# Patient Record
Sex: Female | Born: 1965 | Race: White | Hispanic: No | Marital: Married | Smoking: Never smoker
Health system: Southern US, Community
[De-identification: ages and names within clinical notes are randomized; demographics above are authoritative.]

## PROBLEM LIST (undated history)

## (undated) ENCOUNTER — Emergency Department: Admission: EM | Discharge status: Absent without leave | Payer: BC Managed Care – PPO

## (undated) DIAGNOSIS — G43909 Migraine, unspecified, not intractable, without status migrainosus: Secondary | ICD-10-CM

## (undated) DIAGNOSIS — S40811A Abrasion of right upper arm, initial encounter: Secondary | ICD-10-CM

## (undated) DIAGNOSIS — Z9889 Other specified postprocedural states: Secondary | ICD-10-CM

## (undated) DIAGNOSIS — H3321 Serous retinal detachment, right eye: Secondary | ICD-10-CM

## (undated) DIAGNOSIS — Z853 Personal history of malignant neoplasm of breast: Secondary | ICD-10-CM

## (undated) DIAGNOSIS — E039 Hypothyroidism, unspecified: Secondary | ICD-10-CM

## (undated) DIAGNOSIS — T8859XA Other complications of anesthesia, initial encounter: Secondary | ICD-10-CM

## (undated) DIAGNOSIS — T4145XA Adverse effect of unspecified anesthetic, initial encounter: Secondary | ICD-10-CM

## (undated) DIAGNOSIS — R112 Nausea with vomiting, unspecified: Secondary | ICD-10-CM

## (undated) HISTORY — DX: Serous retinal detachment, right eye: H33.21

## (undated) HISTORY — PX: PALATE TO GINGIVA GRAFT: SHX2152

---

## 1998-06-18 ENCOUNTER — Other Ambulatory Visit: Admission: RE | Admit: 1998-06-18 | Discharge: 1998-06-18 | Payer: Self-pay | Admitting: Obstetrics and Gynecology

## 2000-04-15 ENCOUNTER — Other Ambulatory Visit: Admission: RE | Admit: 2000-04-15 | Discharge: 2000-04-15 | Payer: Self-pay | Admitting: Obstetrics and Gynecology

## 2001-05-19 ENCOUNTER — Other Ambulatory Visit: Admission: RE | Admit: 2001-05-19 | Discharge: 2001-05-19 | Payer: Self-pay | Admitting: Obstetrics and Gynecology

## 2001-10-18 ENCOUNTER — Encounter: Payer: Self-pay | Admitting: Obstetrics and Gynecology

## 2001-10-18 ENCOUNTER — Encounter: Admission: RE | Admit: 2001-10-18 | Discharge: 2001-10-18 | Payer: Self-pay | Admitting: Obstetrics and Gynecology

## 2002-06-27 ENCOUNTER — Other Ambulatory Visit: Admission: RE | Admit: 2002-06-27 | Discharge: 2002-06-27 | Payer: Self-pay | Admitting: Obstetrics and Gynecology

## 2003-07-11 ENCOUNTER — Other Ambulatory Visit: Admission: RE | Admit: 2003-07-11 | Discharge: 2003-07-11 | Payer: Self-pay | Admitting: Obstetrics and Gynecology

## 2004-08-19 ENCOUNTER — Other Ambulatory Visit: Admission: RE | Admit: 2004-08-19 | Discharge: 2004-08-19 | Payer: Self-pay | Admitting: Obstetrics and Gynecology

## 2005-09-11 ENCOUNTER — Other Ambulatory Visit: Admission: RE | Admit: 2005-09-11 | Discharge: 2005-09-11 | Payer: Self-pay | Admitting: Obstetrics and Gynecology

## 2006-11-12 ENCOUNTER — Encounter: Admission: RE | Admit: 2006-11-12 | Discharge: 2006-11-12 | Payer: Self-pay | Admitting: Obstetrics and Gynecology

## 2007-11-22 ENCOUNTER — Encounter: Admission: RE | Admit: 2007-11-22 | Discharge: 2007-11-22 | Payer: Self-pay | Admitting: Obstetrics and Gynecology

## 2008-08-01 ENCOUNTER — Encounter: Admission: RE | Admit: 2008-08-01 | Discharge: 2008-08-01 | Payer: Self-pay | Admitting: Obstetrics and Gynecology

## 2010-01-03 ENCOUNTER — Encounter: Admission: RE | Admit: 2010-01-03 | Discharge: 2010-01-03 | Payer: Self-pay | Admitting: Obstetrics and Gynecology

## 2012-07-28 DIAGNOSIS — Z853 Personal history of malignant neoplasm of breast: Secondary | ICD-10-CM

## 2012-07-28 HISTORY — DX: Personal history of malignant neoplasm of breast: Z85.3

## 2013-03-02 ENCOUNTER — Other Ambulatory Visit: Payer: Self-pay | Admitting: Obstetrics and Gynecology

## 2013-03-02 DIAGNOSIS — Z1231 Encounter for screening mammogram for malignant neoplasm of breast: Secondary | ICD-10-CM

## 2013-04-18 ENCOUNTER — Ambulatory Visit
Admission: RE | Admit: 2013-04-18 | Discharge: 2013-04-18 | Disposition: A | Discharge status: Home / Self Care | Payer: BC Managed Care – PPO | Source: Ambulatory Visit | Attending: Obstetrics and Gynecology | Admitting: Obstetrics and Gynecology

## 2013-04-18 DIAGNOSIS — Z1231 Encounter for screening mammogram for malignant neoplasm of breast: Secondary | ICD-10-CM

## 2013-04-21 ENCOUNTER — Other Ambulatory Visit: Payer: Self-pay | Admitting: Obstetrics and Gynecology

## 2013-04-21 DIAGNOSIS — Z803 Family history of malignant neoplasm of breast: Secondary | ICD-10-CM

## 2013-04-29 ENCOUNTER — Ambulatory Visit
Admission: RE | Admit: 2013-04-29 | Discharge: 2013-04-29 | Disposition: A | Discharge status: Home / Self Care | Payer: BC Managed Care – PPO | Source: Ambulatory Visit | Attending: Obstetrics and Gynecology | Admitting: Obstetrics and Gynecology

## 2013-04-29 DIAGNOSIS — Z803 Family history of malignant neoplasm of breast: Secondary | ICD-10-CM

## 2013-04-29 MED ORDER — GADOBENATE DIMEGLUMINE 529 MG/ML IV SOLN
20.0000 mL | Freq: Once | INTRAVENOUS | Status: AC | PRN
  Administered 2013-04-29: 20 mL via INTRAVENOUS

## 2013-05-03 ENCOUNTER — Other Ambulatory Visit: Payer: Self-pay | Admitting: Obstetrics and Gynecology

## 2013-05-03 DIAGNOSIS — R928 Other abnormal and inconclusive findings on diagnostic imaging of breast: Secondary | ICD-10-CM

## 2013-05-12 ENCOUNTER — Other Ambulatory Visit: Payer: Self-pay | Admitting: Diagnostic Radiology

## 2013-05-12 ENCOUNTER — Ambulatory Visit
Admission: RE | Admit: 2013-05-12 | Discharge: 2013-05-12 | Disposition: A | Discharge status: Home / Self Care | Payer: BC Managed Care – PPO | Source: Ambulatory Visit | Attending: Obstetrics and Gynecology | Admitting: Obstetrics and Gynecology

## 2013-05-12 DIAGNOSIS — R928 Other abnormal and inconclusive findings on diagnostic imaging of breast: Secondary | ICD-10-CM

## 2013-05-12 MED ORDER — GADOBENATE DIMEGLUMINE 529 MG/ML IV SOLN
20.0000 mL | Freq: Once | INTRAVENOUS | Status: AC | PRN
  Administered 2013-05-12: 20 mL via INTRAVENOUS

## 2013-05-19 ENCOUNTER — Encounter (INDEPENDENT_AMBULATORY_CARE_PROVIDER_SITE_OTHER): Payer: Self-pay

## 2013-05-19 ENCOUNTER — Encounter (INDEPENDENT_AMBULATORY_CARE_PROVIDER_SITE_OTHER): Payer: Self-pay | Admitting: General Surgery

## 2013-05-19 ENCOUNTER — Ambulatory Visit (INDEPENDENT_AMBULATORY_CARE_PROVIDER_SITE_OTHER): Payer: BC Managed Care – PPO | Admitting: General Surgery

## 2013-05-19 VITALS — BP 118/70 | HR 76 | Temp 98.9°F | Resp 14 | Ht 67.5 in | Wt 225.0 lb

## 2013-05-19 DIAGNOSIS — D0502 Lobular carcinoma in situ of left breast: Secondary | ICD-10-CM

## 2013-05-19 DIAGNOSIS — C50919 Malignant neoplasm of unspecified site of unspecified female breast: Secondary | ICD-10-CM

## 2013-05-19 NOTE — Patient Instructions (Signed)
Plan for genetics testing and high risk visit with Dr. Welton Flakes

## 2013-05-19 NOTE — Progress Notes (Signed)
Patient ID: [name], female   DOB: March 23, 1966, 47 y.o.   MRN: [id number]  Chief Complaint  Patient presents with  . 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 female.  We are asked to see the patient in consultation by Dr. Arelia Sneddon to evaluate her for lobular neoplasia of the left breast. The patient is a 15 her white female who recently underwent an MRI study of both breasts because of her strong family history. At that time she was found to have 2 abnormalities in the 12:00 position of the left breast. Both of these were biopsied. 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 She has a long history of occasional pain in both breasts. She denies any discharge from the nipple. She does have a sister who is 60 years old and was recently diagnosed with stage I breast cancer. Her mother was also diagnosed with breast cancer at the age of 42. She does not take any female hormones HPI  Past Medical History  Diagnosis Date  . Anemia   . Cancer     LCIS  . Hyperlipidemia   . Thyroid disease     History reviewed. No pertinent past surgical history.  Family History  Problem Relation Age of Onset  . Cancer Mother     breast  . Cancer Father     melanoma  . Cancer Sister     sister    Social History History  Substance Use Topics  . Smoking status: Never Smoker   . Smokeless tobacco: Never Used  . Alcohol Use: No    Allergies  Allergen Reactions  . Penicillins Rash    Current Outpatient Prescriptions  Medication Sig Dispense Refill  . Calcium Carbonate-Vitamin D (CALCIUM + D PO) Take by mouth.      . cholestyramine (QUESTRAN) 4 GM/DOSE powder Take by mouth 3 (three) times daily with meals.      Marland Kitchen levothyroxine (SYNTHROID, LEVOTHROID) 112 MCG tablet Take 112 mcg by mouth daily before breakfast.      . Multiple Vitamin (MULTIVITAMIN) tablet Take 1 tablet by mouth daily.       No  current facility-administered medications for this visit.    Review of Systems Review of Systems  Constitutional: Negative.   HENT: Negative.   Eyes: Negative.   Respiratory: Negative.   Cardiovascular: Negative.   Gastrointestinal: Positive for abdominal pain.  Endocrine: Negative.   Genitourinary: Negative.   Musculoskeletal: Positive for arthralgias.  Skin: Negative.   Allergic/Immunologic: Negative.   Neurological: Negative.   Hematological: Negative.   Psychiatric/Behavioral: Negative.     Blood pressure 118/70, pulse 76, temperature 98.9 F (37.2 C), temperature source Temporal, resp. rate 14, height 5' 7.5" (1.715 m), weight 225 lb (102.059 kg).  Physical Exam Physical Exam  Constitutional: She is oriented to person, place, and time. She appears well-developed and well-nourished.  HENT:  Head: Normocephalic and atraumatic.  Eyes: Conjunctivae and EOM are normal. Pupils are equal, round, and reactive to light.  Neck: Normal range of motion. Neck supple.  Cardiovascular: Normal rate, regular rhythm and normal heart sounds.   Pulmonary/Chest: Effort normal and breath sounds normal.  There is some palpable bruising in the 12:00 position of the left breast. Other than this there is no other palpable mass in either breast. There is no palpable axillary, supraclavicular, or cervical lymphadenopathy  Abdominal: Soft. Bowel sounds are normal. She exhibits no mass. There is no tenderness.  Musculoskeletal: Normal range of motion.  Lymphadenopathy:    She has no cervical adenopathy.  Neurological: She is alert and oriented to person, place, and time.  Skin: Skin is warm and dry.  Psychiatric: She has a normal mood and affect. Her behavior is normal.    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 I  would also like her to meet with Dr. Welton Flakes to evaluate her Because she is in a high risk category for breast cancer. At a minimum I think she needs to have both of these high risk areas removed with a lumpectomy. The recommendation might change based on her genetic testing. I will plan to see her back in about 3 or 4 weeks once her evaluation is complete       TOTH III,Molli Gethers S 05/19/2013, 3:44 PM

## 2013-05-23 ENCOUNTER — Telehealth: Payer: Self-pay | Admitting: Genetic Counselor

## 2013-05-23 NOTE — Telephone Encounter (Signed)
Called pt to schedule genetic appt per pt MD wanted pt seen before next visit with him on 11/20. Emailed Clydie Braun to make aware.

## 2013-05-24 ENCOUNTER — Telehealth: Payer: Self-pay | Admitting: Genetic Counselor

## 2013-05-24 ENCOUNTER — Telehealth (INDEPENDENT_AMBULATORY_CARE_PROVIDER_SITE_OTHER): Payer: Self-pay | Admitting: General Surgery

## 2013-05-24 NOTE — Telephone Encounter (Signed)
[facility] APPT 10/29 @ 9 W/KAREN POWELL REFERRING DR. Carolynne Edouard

## 2013-05-24 NOTE — Telephone Encounter (Signed)
Patient wanted Dr Carolynne Edouard to know that the [facility] can not get here in the cancer ctr to see genetic testing or see a Dr until Jan and the patient called [facility] and they can get her in 06-01-13. And she can get the genetic testing and see a Doctor at the same time. But the patient wants Dr Carolynne Edouard to be her Doctor. She wants Dr Carolynne Edouard to call her back on [phone]

## 2013-05-25 ENCOUNTER — Telehealth (INDEPENDENT_AMBULATORY_CARE_PROVIDER_SITE_OTHER): Payer: Self-pay | Admitting: General Surgery

## 2013-05-25 ENCOUNTER — Ambulatory Visit (HOSPITAL_BASED_OUTPATIENT_CLINIC_OR_DEPARTMENT_OTHER): Payer: BC Managed Care – PPO | Admitting: Genetic Counselor

## 2013-05-25 ENCOUNTER — Encounter: Payer: Self-pay | Admitting: Genetic Counselor

## 2013-05-25 ENCOUNTER — Other Ambulatory Visit: Payer: BC Managed Care – PPO | Admitting: Lab

## 2013-05-25 DIAGNOSIS — Z803 Family history of malignant neoplasm of breast: Secondary | ICD-10-CM

## 2013-05-25 DIAGNOSIS — D0502 Lobular carcinoma in situ of left breast: Secondary | ICD-10-CM

## 2013-05-25 DIAGNOSIS — D0590 Unspecified type of carcinoma in situ of unspecified breast: Secondary | ICD-10-CM

## 2013-05-25 DIAGNOSIS — IMO0002 Reserved for concepts with insufficient information to code with codable children: Secondary | ICD-10-CM

## 2013-05-25 NOTE — Telephone Encounter (Signed)
[name] called today to let Dr Carolynne Edouard know that she had her genetic testing was done and is waiting on to see Dr Welton Flakes. Patient stated she will talk to Dr Carolynne Edouard on her next Doctor apt to see if he can her get her in sooner to see Dr Welton Flakes. Dr Welton Flakes wants to wait up to 6 -8 weeks before seeing her

## 2013-05-25 NOTE — Telephone Encounter (Signed)
Pt was called and give appt for today.

## 2013-05-25 NOTE — Progress Notes (Signed)
Dr.  Chevis Pretty requested a consultation for genetic counseling and risk assessment for [name], a 47 y.o. female, for discussion of her prersonal history of LCIS and family history of breast cancer.  She presents to clinic today to discuss the possibility of a genetic predisposition to cancer, and to further clarify her risks, as well as her family members' risks for cancer.   HISTORY OF PRESENT ILLNESS: In 2014, at the age of 62, [name] was diagnosed with LCIS of the left breast. This will be treated with lumpectomy and tamoxifen.  Her tumor prognostics are not known at this time.  She has not had a colonoscopy. Her sister has been tested for BRCA mutations in July 2014 and was negative.   Past Medical History  Diagnosis Date  . Anemia   . Cancer     LCIS  . Hyperlipidemia   . Thyroid disease     History reviewed. No pertinent past surgical history.  History   Social History  . Marital Status: Married    Spouse Name: [name]    Number of Children: 0  . Years of Education: N/A   Occupational History  .     Social History Main Topics  . Smoking status: Never Smoker   . Smokeless tobacco: Never Used  . Alcohol Use: No  . Drug Use: No  . Sexual Activity: Yes   Other Topics Concern  . None   Social History Narrative  . None    REPRODUCTIVE HISTORY AND PERSONAL RISK ASSESSMENT FACTORS: Menarche was at age 17.   premenopausal Uterus Intact: yes Ovaries Intact: yes [id number], first live birth at age N/A  She has not previously undergone treatment for infertility.   Oral Contraceptive use: 20 years   She has not used HRT in the past.    FAMILY HISTORY:  We obtained a detailed, 4-generation family history.  Significant diagnoses are listed below: Family History  Problem Relation Age of Onset  . Breast cancer Mother 34  . Melanoma Father 4  . Breast cancer Sister 81    ER+/her2-; onco score = 8  . Cancer Maternal Aunt     oral cancer; smoker  . Head &  neck cancer Paternal Uncle     smoker  . Breast cancer Cousin     dx in her late 64s-60s  Her mother was diagnosed in 62 at at the age of 6 with invasive breast cancer.  She had a modified radical mastectomy and took tamoxifen for 10 years.  Her sister was diagnosed with breast cancer at 54.  She was ER+/Her2- and her oncoscore was 8.  Patient's maternal ancestors are of [place] descent, and paternal ancestors are of [place], [place] and [place] descent. There is no reported Ashkenazi Jewish ancestry. There is no known consanguinity.  GENETIC COUNSELING ASSESSMENT: [name] is a 47 y.o. female with a personal history of LCIS breast cancer and family history of breast cancer which somewhat suggestive of a hereditary cancer syndrome and predisposition to cancer. We, therefore, discussed and recommended the following at today's visit.   DISCUSSION: We reviewed the characteristics, features and inheritance patterns of hereditary cancer syndromes. We also discussed genetic testing, including the appropriate family members to test, the process of testing, insurance coverage and turn-around-time for results. We discussed that since her sister is negative for BRCA mutations, it lowers her risk somewhat for testing positive for a BRCA mutation.  We reviewed the candidate genes associated  with hereditary breast cancer syndromes and their varying risks.  Her sister did not have a cancer panel performed in July, so if the patient tests positive for a different gene, then her sister could consider further testing for that mutation.    PLAN: After considering the risks, benefits, and limitations, [name] provided informed consent to pursue genetic testing and the blood sample will be sent to [facility] for analysis of the Breast/Ovarian Cancer Panel. We discussed the implications of a positive, negative and/ or variant of uncertain significance genetic test result. Results should be  available within approximately 3 weeks' time, at which point they will be disclosed by telephone to [name], as will any additional recommendations warranted by these results. [name] will receive a summary of her genetic counseling visit and a copy of her results once available. This information will also be available in Epic. We encouraged [name] to remain in contact with cancer genetics annually so that we can continuously update the family history and inform her of any changes in cancer genetics and testing that may be of benefit for her family. [name]'s questions were answered to her satisfaction today. Our contact information was provided should additional questions or concerns arise.  The patient was seen for a total of 60 minutes, greater than 50% of which was spent face-to-face counseling.  This note will also be sent to the referring provider via the electronic medical record. The patient will be supplied with a summary of this genetic counseling discussion as well as educational information on the discussed hereditary cancer syndromes following the conclusion of their visit.   Patient was discussed with Dr. Drue Second.   _______________________________________________________________________ For Office Staff:  Number of people involved in session: 2 Was an Intern/ student involved with case: no

## 2013-06-08 ENCOUNTER — Telehealth: Payer: Self-pay | Admitting: Genetic Counselor

## 2013-06-08 NOTE — Telephone Encounter (Signed)
Revealed negative genetic test results but that she has an STK11 VUS

## 2013-06-10 ENCOUNTER — Encounter: Payer: Self-pay | Admitting: Genetic Counselor

## 2013-06-13 ENCOUNTER — Telehealth (INDEPENDENT_AMBULATORY_CARE_PROVIDER_SITE_OTHER): Payer: Self-pay

## 2013-06-13 NOTE — Telephone Encounter (Signed)
Pt called in to let our office know that she still has not heard from Dr. Milta Deiters office about her appointment with them.  Asked if she still needs to come see Dr. Carolynne Edouard on 11/20.  I suggested that she keep her appt with Dr. Carolynne Edouard, and we will attempt to get her in with Dr. Welton Flakes.

## 2013-06-14 NOTE — Telephone Encounter (Signed)
Called [name] and moved her appt time up with Dr Carolynne Edouard on Thursday. Advised I sent another request for appt with Dr Welton Flakes to cancer center. Will await appt.

## 2013-06-16 ENCOUNTER — Encounter (INDEPENDENT_AMBULATORY_CARE_PROVIDER_SITE_OTHER): Payer: Self-pay | Admitting: General Surgery

## 2013-06-16 ENCOUNTER — Ambulatory Visit (INDEPENDENT_AMBULATORY_CARE_PROVIDER_SITE_OTHER): Payer: BC Managed Care – PPO | Admitting: General Surgery

## 2013-06-16 ENCOUNTER — Encounter (HOSPITAL_BASED_OUTPATIENT_CLINIC_OR_DEPARTMENT_OTHER): Payer: Self-pay | Admitting: *Deleted

## 2013-06-16 VITALS — BP 122/78 | HR 84 | Temp 98.3°F | Resp 18 | Ht 67.5 in | Wt 228.0 lb

## 2013-06-16 DIAGNOSIS — D0502 Lobular carcinoma in situ of left breast: Secondary | ICD-10-CM

## 2013-06-16 DIAGNOSIS — C50919 Malignant neoplasm of unspecified site of unspecified female breast: Secondary | ICD-10-CM

## 2013-06-16 NOTE — Progress Notes (Signed)
Subjective:     Patient ID: [name], female   DOB: [birth year], 47 y.o.   MRN: [id number]  HPI The patient is a 47 year old white female who has 2 high risk areas in the 12:00 position of the left breast. 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 Since her last visit she underwent genetic testing and it is negative for BRCA1 or 2. She does have a variant of unknown significance. She denies any breast pain.  Review of Systems  Constitutional: Negative.   HENT: Negative.   Eyes: Negative.   Respiratory: Negative.   Cardiovascular: Negative.   Gastrointestinal: Negative.   Endocrine: Negative.   Genitourinary: Negative.   Musculoskeletal: Negative.   Skin: Negative.   Allergic/Immunologic: Negative.   Neurological: Negative.   Hematological: Negative.   Psychiatric/Behavioral: Negative.        Objective:   Physical Exam  Constitutional: She is oriented to person, place, and time. She appears well-developed and well-nourished.  HENT:  Head: Normocephalic and atraumatic.  Eyes: Conjunctivae and EOM are normal. Pupils are equal, round, and reactive to light.  Neck: Normal range of motion. Neck supple.  Cardiovascular: Normal rate, regular rhythm and normal heart sounds.   Pulmonary/Chest: Effort normal and breath sounds normal.  There is no palpable mass in either breast. There is no palpable axillary, supraclavicular, or cervical lymphadenopathy  Abdominal: Soft. Bowel sounds are normal. She exhibits no mass. There is no tenderness.  Musculoskeletal: Normal range of motion.  Lymphadenopathy:    She has no cervical adenopathy.  Neurological: She is alert and oriented to person, place, and time.  Skin: Skin is warm and dry.  Psychiatric: She has a normal mood and affect. Her behavior is normal.       Assessment:     The patient is a 47 year old white female was too high risk lesions in the 12:00 position of the left breast.     Plan:     At this point I would recommend wire  localized lumpectomy of these 2 areas. I've discussed with her in detail the risks and benefits of the operation and dizziness as well as some of the technical aspects and she understands and wishes to proceed

## 2013-06-16 NOTE — Patient Instructions (Signed)
Plan for left breast wire localized lumpectomy 

## 2013-06-20 ENCOUNTER — Encounter (HOSPITAL_BASED_OUTPATIENT_CLINIC_OR_DEPARTMENT_OTHER)
Admission: RE | Disposition: A | Discharge status: Home / Self Care | Payer: Self-pay | Source: Ambulatory Visit | Attending: General Surgery

## 2013-06-20 ENCOUNTER — Ambulatory Visit (HOSPITAL_BASED_OUTPATIENT_CLINIC_OR_DEPARTMENT_OTHER)
Admission: RE | Admit: 2013-06-20 | Discharge: 2013-06-20 | Disposition: A | Discharge status: Home / Self Care | Payer: BC Managed Care – PPO | Source: Ambulatory Visit | Attending: General Surgery | Admitting: General Surgery

## 2013-06-20 ENCOUNTER — Ambulatory Visit (HOSPITAL_BASED_OUTPATIENT_CLINIC_OR_DEPARTMENT_OTHER): Payer: BC Managed Care – PPO | Admitting: Anesthesiology

## 2013-06-20 ENCOUNTER — Encounter (HOSPITAL_BASED_OUTPATIENT_CLINIC_OR_DEPARTMENT_OTHER): Payer: Self-pay | Admitting: *Deleted

## 2013-06-20 ENCOUNTER — Encounter (HOSPITAL_BASED_OUTPATIENT_CLINIC_OR_DEPARTMENT_OTHER): Payer: BC Managed Care – PPO | Admitting: Anesthesiology

## 2013-06-20 ENCOUNTER — Ambulatory Visit
Admission: RE | Admit: 2013-06-20 | Discharge: 2013-06-20 | Disposition: A | Discharge status: Home / Self Care | Payer: BC Managed Care – PPO | Source: Ambulatory Visit | Attending: General Surgery | Admitting: General Surgery

## 2013-06-20 DIAGNOSIS — N6019 Diffuse cystic mastopathy of unspecified breast: Secondary | ICD-10-CM | POA: Insufficient documentation

## 2013-06-20 DIAGNOSIS — D0502 Lobular carcinoma in situ of left breast: Secondary | ICD-10-CM

## 2013-06-20 DIAGNOSIS — D249 Benign neoplasm of unspecified breast: Secondary | ICD-10-CM

## 2013-06-20 DIAGNOSIS — E039 Hypothyroidism, unspecified: Secondary | ICD-10-CM | POA: Insufficient documentation

## 2013-06-20 DIAGNOSIS — N6089 Other benign mammary dysplasias of unspecified breast: Secondary | ICD-10-CM | POA: Insufficient documentation

## 2013-06-20 DIAGNOSIS — D049 Carcinoma in situ of skin, unspecified: Secondary | ICD-10-CM | POA: Insufficient documentation

## 2013-06-20 DIAGNOSIS — D4860 Neoplasm of uncertain behavior of unspecified breast: Secondary | ICD-10-CM

## 2013-06-20 HISTORY — DX: Hypothyroidism, unspecified: E03.9

## 2013-06-20 HISTORY — DX: Other complications of anesthesia, initial encounter: T88.59XA

## 2013-06-20 HISTORY — PX: BREAST LUMPECTOMY WITH NEEDLE LOCALIZATION: SHX5759

## 2013-06-20 HISTORY — DX: Adverse effect of unspecified anesthetic, initial encounter: T41.45XA

## 2013-06-20 SURGERY — BREAST LUMPECTOMY WITH NEEDLE LOCALIZATION
Anesthesia: General | Site: Breast | Laterality: Left | Wound class: Clean

## 2013-06-20 MED ORDER — ONDANSETRON HCL 4 MG/2ML IJ SOLN
INTRAMUSCULAR | Status: DC | PRN
  Administered 2013-06-20: 4 mg via INTRAVENOUS

## 2013-06-20 MED ORDER — PROMETHAZINE HCL 25 MG/ML IJ SOLN
6.2500 mg | INTRAMUSCULAR | Status: DC | PRN
  Administered 2013-06-20: 6.25 mg via INTRAVENOUS

## 2013-06-20 MED ORDER — FENTANYL CITRATE 0.05 MG/ML IJ SOLN
INTRAMUSCULAR | Status: DC | PRN
  Administered 2013-06-20 (×2): 25 ug via INTRAVENOUS
  Administered 2013-06-20: 50 ug via INTRAVENOUS
  Administered 2013-06-20: 100 ug via INTRAVENOUS

## 2013-06-20 MED ORDER — BUPIVACAINE HCL (PF) 0.25 % IJ SOLN
INTRAMUSCULAR | Status: DC | PRN
  Administered 2013-06-20: 16 mL

## 2013-06-20 MED ORDER — VANCOMYCIN HCL IN DEXTROSE 1-5 GM/200ML-% IV SOLN
1000.0000 mg | INTRAVENOUS | Status: AC
  Administered 2013-06-20 (×2): 1000 mg via INTRAVENOUS

## 2013-06-20 MED ORDER — OXYCODONE HCL 5 MG/5ML PO SOLN
5.0000 mg | Freq: Once | ORAL | Status: DC | PRN
Start: 2013-06-20 — End: 2013-06-20

## 2013-06-20 MED ORDER — LIDOCAINE-EPINEPHRINE 0.5 %-1:200000 IJ SOLN
INTRAMUSCULAR | Status: AC
  Filled 2013-06-20: qty 1

## 2013-06-20 MED ORDER — PROMETHAZINE HCL 25 MG/ML IJ SOLN
INTRAMUSCULAR | Status: AC
  Filled 2013-06-20: qty 7

## 2013-06-20 MED ORDER — CHLORHEXIDINE GLUCONATE 4 % EX LIQD: 1.0000 "application " | Freq: Once | CUTANEOUS | Status: DC

## 2013-06-20 MED ORDER — LIDOCAINE HCL (CARDIAC) 20 MG/ML IV SOLN
INTRAVENOUS | Status: DC | PRN
  Administered 2013-06-20: 100 mg via INTRAVENOUS

## 2013-06-20 MED ORDER — FENTANYL CITRATE 0.05 MG/ML IJ SOLN: 50.0000 ug | INTRAMUSCULAR | Status: DC | PRN

## 2013-06-20 MED ORDER — DEXAMETHASONE SODIUM PHOSPHATE 4 MG/ML IJ SOLN
INTRAMUSCULAR | Status: DC | PRN
  Administered 2013-06-20: 10 mg via INTRAVENOUS

## 2013-06-20 MED ORDER — OXYCODONE-ACETAMINOPHEN 5-325 MG PO TABS: 1.0000 | ORAL_TABLET | ORAL | Status: DC | PRN

## 2013-06-20 MED ORDER — MIDAZOLAM HCL 5 MG/5ML IJ SOLN
INTRAMUSCULAR | Status: DC | PRN
  Administered 2013-06-20: 2 mg via INTRAVENOUS

## 2013-06-20 MED ORDER — BUPIVACAINE HCL (PF) 0.25 % IJ SOLN
INTRAMUSCULAR | Status: AC
  Filled 2013-06-20: qty 30

## 2013-06-20 MED ORDER — PROPOFOL 10 MG/ML IV BOLUS
INTRAVENOUS | Status: DC | PRN
  Administered 2013-06-20: 200 mg via INTRAVENOUS
  Administered 2013-06-20: 50 mg via INTRAVENOUS

## 2013-06-20 MED ORDER — OXYCODONE HCL 5 MG PO TABS: 5.0000 mg | ORAL_TABLET | Freq: Once | ORAL | Status: DC | PRN

## 2013-06-20 MED ORDER — PROPOFOL 10 MG/ML IV BOLUS
INTRAVENOUS | Status: AC
  Filled 2013-06-20: qty 20

## 2013-06-20 MED ORDER — MIDAZOLAM HCL 2 MG/2ML IJ SOLN: 1.0000 mg | INTRAMUSCULAR | Status: DC | PRN

## 2013-06-20 MED ORDER — MIDAZOLAM HCL 2 MG/2ML IJ SOLN
INTRAMUSCULAR | Status: AC
  Filled 2013-06-20: qty 2

## 2013-06-20 MED ORDER — MIDAZOLAM HCL 2 MG/2ML IJ SOLN
1.0000 mg | Freq: Once | INTRAMUSCULAR | Status: AC | PRN
  Administered 2013-06-20: 1 mg via INTRAVENOUS

## 2013-06-20 MED ORDER — HYDROMORPHONE HCL PF 1 MG/ML IJ SOLN
0.2500 mg | INTRAMUSCULAR | Status: DC | PRN
  Administered 2013-06-20 (×2): 0.5 mg via INTRAVENOUS

## 2013-06-20 MED ORDER — LACTATED RINGERS IV SOLN
INTRAVENOUS | Status: DC
  Administered 2013-06-20 (×2): via INTRAVENOUS

## 2013-06-20 MED ORDER — HYDROMORPHONE HCL PF 1 MG/ML IJ SOLN
INTRAMUSCULAR | Status: AC
  Filled 2013-06-20: qty 1

## 2013-06-20 MED ORDER — VANCOMYCIN HCL IN DEXTROSE 1-5 GM/200ML-% IV SOLN
INTRAVENOUS | Status: AC
  Filled 2013-06-20: qty 200

## 2013-06-20 MED ORDER — FENTANYL CITRATE 0.05 MG/ML IJ SOLN
INTRAMUSCULAR | Status: AC
  Filled 2013-06-20: qty 6

## 2013-06-20 MED ORDER — MIDAZOLAM HCL 2 MG/ML PO SYRP: 12.0000 mg | ORAL_SOLUTION | Freq: Once | ORAL | Status: DC | PRN

## 2013-06-20 SURGICAL SUPPLY — 44 items
ADH SKN CLS APL DERMABOND .7 (GAUZE/BANDAGES/DRESSINGS) ×1
BLADE SURG 10 STRL SS (BLADE) ×2 IMPLANT
BLADE SURG 15 STRL LF DISP TIS (BLADE) ×1 IMPLANT
BLADE SURG 15 STRL SS (BLADE) ×2
CANISTER SUCT 1200ML W/VALVE (MISCELLANEOUS) ×2 IMPLANT
CHLORAPREP W/TINT 26ML (MISCELLANEOUS) ×2 IMPLANT
CLIP TI WIDE RED SMALL 6 (CLIP) IMPLANT
COVER MAYO STAND STRL (DRAPES) ×2 IMPLANT
COVER TABLE BACK 60X90 (DRAPES) ×2 IMPLANT
DECANTER SPIKE VIAL GLASS SM (MISCELLANEOUS) ×2 IMPLANT
DERMABOND ADVANCED (GAUZE/BANDAGES/DRESSINGS) ×1
DERMABOND ADVANCED .7 DNX12 (GAUZE/BANDAGES/DRESSINGS) ×1 IMPLANT
DEVICE DUBIN W/COMP PLATE 8390 (MISCELLANEOUS) IMPLANT
DRAPE LAPAROSCOPIC ABDOMINAL (DRAPES) ×2 IMPLANT
DRAPE UTILITY XL STRL (DRAPES) ×2 IMPLANT
ELECT BLADE 4.0 EZ CLEAN MEGAD (MISCELLANEOUS) ×2
ELECT COATED BLADE 2.86 ST (ELECTRODE) ×2 IMPLANT
ELECT REM PT RETURN 9FT ADLT (ELECTROSURGICAL) ×2
ELECTRODE BLDE 4.0 EZ CLN MEGD (MISCELLANEOUS) IMPLANT
ELECTRODE REM PT RTRN 9FT ADLT (ELECTROSURGICAL) ×1 IMPLANT
GLOVE BIO SURGEON STRL SZ7 (GLOVE) ×2 IMPLANT
GLOVE BIO SURGEON STRL SZ7.5 (GLOVE) ×2 IMPLANT
GLOVE ECLIPSE 6.5 STRL STRAW (GLOVE) ×1 IMPLANT
GOWN PREVENTION PLUS XLARGE (GOWN DISPOSABLE) ×4 IMPLANT
GOWN STRL REIN 2XL XLG LVL4 (GOWN DISPOSABLE) ×1 IMPLANT
KIT MARKER MARGIN INK (KITS) IMPLANT
NDL HYPO 25X1 1.5 SAFETY (NEEDLE) ×1 IMPLANT
NEEDLE HYPO 25X1 1.5 SAFETY (NEEDLE) ×2 IMPLANT
NS IRRIG 1000ML POUR BTL (IV SOLUTION) ×2 IMPLANT
PACK BASIN DAY SURGERY FS (CUSTOM PROCEDURE TRAY) ×2 IMPLANT
PENCIL BUTTON HOLSTER BLD 10FT (ELECTRODE) ×2 IMPLANT
SLEEVE SCD COMPRESS KNEE MED (MISCELLANEOUS) ×2 IMPLANT
SPONGE LAP 18X18 X RAY DECT (DISPOSABLE) ×2 IMPLANT
STAPLER VISISTAT 35W (STAPLE) IMPLANT
SUT MON AB 4-0 PC3 18 (SUTURE) ×2 IMPLANT
SUT SILK 2 0 SH (SUTURE) ×2 IMPLANT
SUT VIC AB 3-0 54X BRD REEL (SUTURE) IMPLANT
SUT VIC AB 3-0 BRD 54 (SUTURE)
SUT VICRYL 3-0 CR8 SH (SUTURE) ×2 IMPLANT
SYR CONTROL 10ML LL (SYRINGE) ×2 IMPLANT
TOWEL OR 17X24 6PK STRL BLUE (TOWEL DISPOSABLE) ×2 IMPLANT
TOWEL OR NON WOVEN STRL DISP B (DISPOSABLE) ×2 IMPLANT
TUBE CONNECTING 20X1/4 (TUBING) ×2 IMPLANT
YANKAUER SUCT BULB TIP NO VENT (SUCTIONS) ×2 IMPLANT

## 2013-06-20 NOTE — H&P (View-Only) (Signed)
Subjective:     Patient ID: [name], female   DOB: 03/12/1966, 47 y.o.   MRN: [id number]  HPI The patient is a 47-year-old white female who has 2 high risk areas in the 12:00 position of the left breast. 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 Since her last visit she underwent genetic testing and it is negative for BRCA1 or 2. She does have a variant of unknown significance. She denies any breast pain.  Review of Systems  Constitutional: Negative.   HENT: Negative.   Eyes: Negative.   Respiratory: Negative.   Cardiovascular: Negative.   Gastrointestinal: Negative.   Endocrine: Negative.   Genitourinary: Negative.   Musculoskeletal: Negative.   Skin: Negative.   Allergic/Immunologic: Negative.   Neurological: Negative.   Hematological: Negative.   Psychiatric/Behavioral: Negative.        Objective:   Physical Exam  Constitutional: She is oriented to person, place, and time. She appears well-developed and well-nourished.  HENT:  Head: Normocephalic and atraumatic.  Eyes: Conjunctivae and EOM are normal. Pupils are equal, round, and reactive to light.  Neck: Normal range of motion. Neck supple.  Cardiovascular: Normal rate, regular rhythm and normal heart sounds.   Pulmonary/Chest: Effort normal and breath sounds normal.  There is no palpable mass in either breast. There is no palpable axillary, supraclavicular, or cervical lymphadenopathy  Abdominal: Soft. Bowel sounds are normal. She exhibits no mass. There is no tenderness.  Musculoskeletal: Normal range of motion.  Lymphadenopathy:    She has no cervical adenopathy.  Neurological: She is alert and oriented to person, place, and time.  Skin: Skin is warm and dry.  Psychiatric: She has a normal mood and affect. Her behavior is normal.       Assessment:     The patient is a 47-year-old white female was too high risk lesions in the 12:00 position of the left breast.     Plan:     At this point I would recommend wire  localized lumpectomy of these 2 areas. I've discussed with her in detail the risks and benefits of the operation and dizziness as well as some of the technical aspects and she understands and wishes to proceed       

## 2013-06-20 NOTE — Anesthesia Postprocedure Evaluation (Signed)
Anesthesia Post Note  Patient: [name]  Procedure(s) Performed: Procedure(s) (LRB): BREAST LUMPECTOMY WITH NEEDLE LOCALIZATION (Left)  Anesthesia type: general  Patient location: PACU  Post pain: Pain level controlled  Post assessment: Patient's Cardiovascular Status Stable  Last Vitals:  Filed Vitals:   06/20/13 1300  BP: 120/75  Pulse: 93  Temp:   Resp: 16    Post vital signs: Reviewed and stable  Level of consciousness: sedated  Complications: No apparent anesthesia complications

## 2013-06-20 NOTE — Addendum Note (Signed)
Addendum created 06/20/13 1339 by Ronnette Hila, CRNA   Modules edited: Anesthesia Blocks and Procedures

## 2013-06-20 NOTE — Anesthesia Procedure Notes (Signed)
Procedure Name: LMA Insertion Date/Time: 06/20/2013 9:34 AM Performed by: Zenia Resides D Pre-anesthesia Checklist: Patient identified, Emergency Drugs available, Suction available and Patient being monitored Patient Re-evaluated:Patient Re-evaluated prior to inductionOxygen Delivery Method: Circle System Utilized Preoxygenation: Pre-oxygenation with 100% oxygen Intubation Type: IV induction Ventilation: Mask ventilation without difficulty LMA: LMA inserted LMA Size: 4.0 Number of attempts: 1 Airway Equipment and Method: bite block Placement Confirmation: positive ETCO2 Tube secured with: Tape Dental Injury: Teeth and Oropharynx as per pre-operative assessment

## 2013-06-20 NOTE — Transfer of Care (Signed)
Immediate Anesthesia Transfer of Care Note  Patient: [name]  Procedure(s) Performed: Procedure(s): BREAST LUMPECTOMY WITH NEEDLE LOCALIZATION (Left)  Patient Location: PACU  Anesthesia Type:General  Level of Consciousness: awake and alert   Airway & Oxygen Therapy: Patient Spontanous Breathing and Patient connected to face mask oxygen  Post-op Assessment: Report given to PACU RN and Post -op Vital signs reviewed and stable  Post vital signs: Reviewed and stable  Complications: No apparent anesthesia complications

## 2013-06-20 NOTE — Anesthesia Preprocedure Evaluation (Signed)
Anesthesia Evaluation  Patient identified by MRN, date of birth, ID band Patient awake    Reviewed: Allergy & Precautions, H&P , NPO status , Patient's Chart, lab work & pertinent test results  History of Anesthesia Complications Negative for: history of anesthetic complications  Airway       Dental   Pulmonary neg pulmonary ROS,  breath sounds clear to auscultation        Cardiovascular negative cardio ROS  Rhythm:regular Rate:Normal     Neuro/Psych negative neurological ROS  negative psych ROS   GI/Hepatic negative GI ROS, Neg liver ROS,   Endo/Other  Hypothyroidism Morbid obesity  Renal/GU negative Renal ROS     Musculoskeletal   Abdominal   Peds  Hematology   Anesthesia Other Findings   Reproductive/Obstetrics negative OB ROS                           Anesthesia Physical Anesthesia Plan  ASA: II  Anesthesia Plan: General LMA   Post-op Pain Management:    Induction:   Airway Management Planned:   Additional Equipment:   Intra-op Plan:   Post-operative Plan:   Informed Consent: I have reviewed the patients History and Physical, chart, labs and discussed the procedure including the risks, benefits and alternatives for the proposed anesthesia with the patient or authorized representative who has indicated his/her understanding and acceptance.   Dental Advisory Given  Plan Discussed with: Anesthesiologist, CRNA and Surgeon  Anesthesia Plan Comments:         Anesthesia Quick Evaluation

## 2013-06-20 NOTE — Op Note (Signed)
06/20/2013  10:51 AM  PATIENT:  [name]  47 y.o. female  PRE-OPERATIVE DIAGNOSIS:  LEFT BREAST LCIS  POST-OPERATIVE DIAGNOSIS:  LEFT BREAST LCIS  PROCEDURE:  Procedure(s): BREAST LUMPECTOMY WITH NEEDLE LOCALIZATION (Left) X 2  SURGEON:  Surgeon(s) and Role:    * Robyne Askew, MD - Primary  PHYSICIAN ASSISTANT:   ASSISTANTS: none   ANESTHESIA:   general  EBL:  Total I/O In: 700 [I.V.:700] Out: -   BLOOD ADMINISTERED:none  DRAINS: none   LOCAL MEDICATIONS USED:  MARCAINE     SPECIMEN:  Source of Specimen:  left breast tissue X 2  DISPOSITION OF SPECIMEN:  PATHOLOGY  COUNTS:  YES  TOURNIQUET:  * No tourniquets in log *  DICTATION: .Dragon Dictation After informed consent was obtained the patient was brought to the operating room and placed in the supine position on the operating room table. After adequate induction of general anesthesia the patient's left breast was prepped with ChloraPrep, allowed to dry, and draped in usual sterile manner. Earlier in the day the patient underwent 2 wire localizations. The first was in the 12:00 position of the left breast and headed inferiorly. The second entered the left breast in the upper outer quadrant and headed medially. A curvilinear incision was made with a 15 blade knife in the 12:00 position of the left breast just beneath the entry site of the first wire. This incision was carried through the skin and subcutaneous tissue sharply with electrocautery. The path of the superficial wire could be palpated and a circular portion of breast tissue was excised sharply around the path of the wire. This was done with the electrocautery. Once the specimen was removed it was oriented with a short stitch on the superior surface of a long stitch on the lateral surface and was then subjected to a specimen radiograph. The clip was in the center of the specimen. This was sent to pathology for further evaluation. At the inferior lateral edge  of this cavity blunt dissection was carried out deeper into the breast tissue and in doing so we were able to identify the deep lateral wire. Another circular portion of breast tissue was excised sharply around the path of the wire. This dissection was also done sharply with the electrocautery. Once the specimen was removed it was oriented with a short stitch on the superior surface and a long stitch on the lateral surface and was subjected to a specimen radiograph. The clip again was in the center the specimen. The specimen was then sent to pathology for further evaluation. Hemostasis was achieved using the Bovie electrocautery. The wound was irrigated with copious amounts of saline and infiltrated with quarter percent Marcaine. The deep layer the wound was closed with interrupted 3-0 Vicryl stitches. The skin was then closed with interrupted 4 Monocryl subcuticular stitches. Dermabond dressings were applied. The patient tolerated the procedure well. At the end of the case all needle sponge and instrument counts were correct. The patient was then awakened and taken to recovery in stable condition.  PLAN OF CARE: Discharge to home after PACU  PATIENT DISPOSITION:  PACU - hemodynamically stable.   Delay start of Pharmacological VTE agent (>24hrs) due to surgical blood loss or risk of bleeding: not applicable

## 2013-06-20 NOTE — Interval H&P Note (Signed)
History and Physical Interval Note:  06/20/2013 9:02 AM  [name]  has presented today for surgery, with the diagnosis of LEFT BREAST LCIS  The various methods of treatment have been discussed with the patient and family. After consideration of risks, benefits and other options for treatment, the patient has consented to  Procedure(s): BREAST LUMPECTOMY WITH NEEDLE LOCALIZATION (Left) as a surgical intervention .  The patient's history has been reviewed, patient examined, no change in status, stable for surgery.  I have reviewed the patient's chart and labs.  Questions were answered to the patient's satisfaction.     TOTH III,Badr Piedra S

## 2013-06-21 ENCOUNTER — Encounter (HOSPITAL_BASED_OUTPATIENT_CLINIC_OR_DEPARTMENT_OTHER): Payer: Self-pay | Admitting: General Surgery

## 2013-06-28 ENCOUNTER — Telehealth (INDEPENDENT_AMBULATORY_CARE_PROVIDER_SITE_OTHER): Payer: Self-pay

## 2013-06-28 NOTE — Telephone Encounter (Signed)
Gave pt path results.

## 2013-06-28 NOTE — Telephone Encounter (Signed)
Message copied by Brennan Bailey on Tue Jun 28, 2013  2:11 PM ------      Message from: Zacarias Pontes      Created: Tue Jun 28, 2013 10:20 AM       Pt would like a call back with her path results please...[phone] ------

## 2013-07-05 ENCOUNTER — Encounter (INDEPENDENT_AMBULATORY_CARE_PROVIDER_SITE_OTHER): Payer: Self-pay | Admitting: General Surgery

## 2013-07-05 ENCOUNTER — Ambulatory Visit (INDEPENDENT_AMBULATORY_CARE_PROVIDER_SITE_OTHER): Payer: BC Managed Care – PPO | Admitting: General Surgery

## 2013-07-05 VITALS — BP 120/82 | HR 80 | Temp 98.2°F | Resp 16 | Ht 67.5 in | Wt 227.0 lb

## 2013-07-05 DIAGNOSIS — C50919 Malignant neoplasm of unspecified site of unspecified female breast: Secondary | ICD-10-CM

## 2013-07-05 DIAGNOSIS — D0502 Lobular carcinoma in situ of left breast: Secondary | ICD-10-CM

## 2013-07-05 NOTE — Patient Instructions (Signed)
Continue regular self exams  

## 2013-07-05 NOTE — Progress Notes (Signed)
Subjective:     Patient ID: [name], female   DOB: [birth year], 47 y.o.   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 She tolerated the surgery well. She denies any significant breast pain.  Review of Systems     Objective:   Physical Exam On exam her left breast incision is healing nicely with no sign of infection or significant seroma    Assessment:     The patient is 2 weeks status post left breast lumpectomy for high risk lesions.     Plan:     At this point I will refer her to Dr. Welton Flakes at the cancer center to counsel her about being high risk for breast cancer. I will plan to see her back in a couple months to check her progress

## 2013-07-06 ENCOUNTER — Telehealth: Payer: Self-pay | Admitting: Oncology

## 2013-07-06 NOTE — Telephone Encounter (Signed)
S/W PT AND GVE HIGH RISK APPT 01/13 @ 3:30 W/DR. [facility] CALENDAR MAILED.

## 2013-07-06 NOTE — Telephone Encounter (Signed)
C/D 07/06/13 for appt. 08/09/13

## 2013-08-09 ENCOUNTER — Encounter: Payer: Self-pay | Admitting: Oncology

## 2013-08-09 ENCOUNTER — Ambulatory Visit (HOSPITAL_BASED_OUTPATIENT_CLINIC_OR_DEPARTMENT_OTHER): Payer: BC Managed Care – PPO | Admitting: Oncology

## 2013-08-09 ENCOUNTER — Telehealth: Payer: Self-pay | Admitting: Oncology

## 2013-08-09 VITALS — BP 128/83 | HR 83 | Temp 98.3°F | Resp 20 | Ht 67.5 in | Wt 230.0 lb

## 2013-08-09 DIAGNOSIS — D0590 Unspecified type of carcinoma in situ of unspecified breast: Secondary | ICD-10-CM

## 2013-08-09 DIAGNOSIS — D0502 Lobular carcinoma in situ of left breast: Secondary | ICD-10-CM

## 2013-08-09 DIAGNOSIS — Z803 Family history of malignant neoplasm of breast: Secondary | ICD-10-CM

## 2013-08-09 MED ORDER — TAMOXIFEN CITRATE 20 MG PO TABS: 20.0000 mg | ORAL_TABLET | Freq: Every day | ORAL | Status: AC

## 2013-08-09 NOTE — Telephone Encounter (Signed)
gv pt appt schedule for may.  °

## 2013-08-09 NOTE — Progress Notes (Signed)
[facility] New Patient Evaluation  Name: [name]            Date: 08/09/2013 MRN: [id number]                DOB: [birth year]  CC: Dr. Autumn Messing Dr. Arvella Nigh  REFERRING PHYSICIAN: Dr. 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 female initial office visit. Clinically patient seems to be doing well. Most recently patient has a screening mammogram performed which did not show anything. However she had MRI study of both breasts because of her strong family history. She was found to have 2 abnormalities in the 12:00 position of the left breast. Both of these were biopsied. 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 Patient has had a long history of occasional pain in both breasts. She has no other issues related to that breasts. Patient does have a sister who had breast cancer at the age of 31. She also had mother who had breast cancer at the age of 34.Patient underwent wire localized lumpectomy performed on 06/20/2013. Results are as noted above. She is now seen in the high-risk clinic for discussion of future breast cancer risk reduction.  PAST MEDICAL HISTORY:  has a past medical history of Hyperlipidemia; History of anemia; Hypothyroidism; Chronic diarrhea; Cancer; and Complication of anesthesia.  PAST SURGICAL HISTORY:  Past Surgical History  Procedure Laterality Date  . Gum surgery    . Breast lumpectomy with needle  localization Left 06/20/2013    Procedure: BREAST LUMPECTOMY WITH NEEDLE LOCALIZATION;  Surgeon: Merrie Roof, MD;  Location: [facility];  Service: General;  Laterality: Left;      CURRENT MEDICATIONS: Ms. Sachs had no medications administered during this visit.  ALLERGIES: Penicillins  SOCIAL HISTORY:  reports that she has never smoked. She has never used smokeless tobacco. She reports that she does not drink alcohol or use illicit drugs.  HEALTH HABITS: Vitamins:multivitamin, calcium, vitamin D Supplements: no Alternative Therapies: no Adverse environmental exposure:no Servings of fruit and vegetables/day: 2 fruits/ vege 1 Servings of meat/day: 1-2 Exercises regularly:     no            Min/wk: Smoker/nonsmoker: none Alcohol: none Number of alcoholic beverages/week: none  REPRODUCTIVE HISTORY:  Menarche age: 60 Gravida:    0   Para: 0 First Live Birth: 0 Number of live births: 0 Breast fed: Y/N  # months n/a Took fertility meds:   no                  Type:  Menses:  Pre-menopausal Oral Contraceptives:  20       # of years Menopause: natural/surgical  Agen/a HRT Y/N Currently Y/N TType:    n/a                               #  years Sexually transmitted disease:    FAMILY HISTORY:  family history includes Breast cancer in her cousin; Breast cancer (age of onset: 12) in her sister; Breast cancer (age of onset: 61) in her mother; Cancer in her maternal aunt; Head & neck cancer in her paternal uncle; Melanoma (age of onset: 52) in her father.  HEALTH MAINTENANCE: Last mammogram: 03/2013 Last clinical breast exam: 03/2013  Performs self breast exam:  no Last Pap Smear: yes Colonoscopy:  Yes 11/13 Last skin exam:  no  REVIEW OF SYSTEMS:  General: Negative for fever, chills, night sweats,  loss of appetite or weight loss. HEENT: Negative for headaches, sore  throat, difficulty swallowing, blurred vision or problem with hearing or  sinus congestion.  Respiratory: Negative for shortness of breath, cough  or dyspnea on exertion. Cardiovascular: Negative for chest pain,  palpitations or pedal edema. GI: Negative for nausea, vomiting,  diarrhea, constipation, change in bowel habits or blood in the stool.  No jaundice. GU: Negative for painful or frequent urination, change in  color of urine, or decreased urinary stream. Integumentary: Negative  for skin rashes or other suspicious skin lesions. Hematologic: Negative  for easy bruisability or bleeding. Musculoskeletal: Negative for  complaints of pain, arthralgias, arthritis or myalgias.  Neurological/psychiatric: Negative for numbness, focal weakness,  balance problems or coordination difficulties. No depression or mood swings.  Breast: No self detected abnormalities in the breast. No nipple discharge, masses or redness of the skin.   PHYSICAL EXAM: BP 128/83  Pulse 83  Temp(Src) 98.3 F (36.8 C) (Oral)  Resp 20  Ht 5' 7.5" (1.715 m)  Wt 230 lb (104.327 kg)  BMI 35.47 kg/m2 GENERAL: Well developed, well nourished, in no acute distress.  EENT: No ocular or oral lesions. No stomatitis.  RESPIRATORY: Lungs are clear to auscultation bilaterally with normal respiratory movement and no accessory muscle use. CARDIAC: No murmur, rub or tachycardia. No upper or lower extremity edema.  GI: Abdomen is soft, no palpable hepatosplenomegaly. No fluid wave. No tenderness. Musculoskeletal: No kyphosis, no tenderness over the spine, ribs or hips. Lymph: No cervical, infraclavicular, axillary or inguinal adenopathy. Neuro: No focal neurological deficits. Psych: Alert and oriented X 3, appropriate mood and affect.  BREAST EXAM: In the supine position, with the right arm over the head, right nipple is everted. No periareolar edema or nipple discharge. No mass in any quadrant or subareolar region. No redness of the skin. No right axillary adenopathy. With the left arm over the head, left nipple is everted.  No periareolar edema or nipple discharge. No mass in any quadrant or subareolar region. No redness of the skin. No left axillary adenopathy.    ASSESSMENT:48 year old female with strong family history of early onset breast cancer including sister who is 72 and mother at 28. Patient herself does not have breast cancer but has developed LCIS of the left breast found on MRI but not mammograms. Patient is now status post lumpectomy. Patient and I discussed the pathophysiology of high-risk lesions. We discussed strategies to reduce future breast cancer risk. We also discussed strategies for screening in the future. I do think this patient would be a good candidate for tamoxifen to help reduce her risk in the future of developing breast cancer for both ipsilateral as well as the contralateral breast. We also discussed this screening strategies. I do think that she would need a good candidate for MRI screening. Since her risk is quite high in terms of developing breast cancer and she  has a strong family history. We also discussed genetics of breast cancer. She was seen by Roma Kayser. She was tested for BRCA1 and BRCA2 gene mutation she was negative. She also was negative forother hereditary breast cancer syndromes.    PLAN:  #1 patient will begin tamoxifen 20 mg daily. We discussed risks benefits and potential side effects. She has consented to this. I will plan on seeing her back in 3 months time for followup.  #2 we discussed future screening strategies. This would be a good patient for MRI screening along with mammogram screening. We discussed the rationale for this.  #3 we discussed lifestyle modification including exercise eating healthy and weight reduction and reducing alcohol consumption.  #4 we discussed self breast examinations as well as clinical examinations.   The length of time of the face-to-face encounter was 60    minutes. More than 50% of time was spent counseling and coordination of  care.  Marcy Panning, MD Medical/Oncology [facility] [phone] (beeper) [phone] (Office)

## 2013-08-09 NOTE — Patient Instructions (Signed)

## 2013-09-07 ENCOUNTER — Other Ambulatory Visit: Payer: Self-pay

## 2013-09-07 ENCOUNTER — Telehealth: Payer: Self-pay

## 2013-09-07 NOTE — Telephone Encounter (Signed)
Patient advised to stop Tamoxifen and call us back in 2-3 weeks per Dr Humphrey Rolls.  Patient stated again that she really hopes to go back to taking it again at some point but she voiced her understanding.  Will call back with any other questions or concerns.  Next follow up appointment 12/07/13.

## 2013-09-07 NOTE — Telephone Encounter (Signed)
Message copied by Marian Sorrow on Wed Sep 07, 2013  3:57 PM ------      Message from: [name]      Created: Wed Sep 07, 2013  2:28 PM       Ask her to stop the tamoxifen. And call us in 2 - 3 weeks to see how she is doing            ----- Message -----         From: Marian Sorrow, LPN         Sent: 5/59/7416   1:46 PM           To: [name], MD            Patient was seen as new patient 08/09/13 in high risk clinic.  She had lumpectomy 11/14 for LCIS.  She is currently taking Tamoxifen 20mg  daily.  She is having n/v and cramping with bowel movements. Her last menstrual cycle was Feb 3rd and lasted 2 days.  C/o some bone pain in the hips and joint pain.  Stated she had chronic diarrhea even before starting medicine but that the cramping is new.  Wondering if her symptoms are related to her tamoxifen or her chronic GI problems? She stated she really wants to stay on her tamoxifen if possible as long as you feel these are ok symptoms to be having. Please advise.         ------

## 2013-09-16 ENCOUNTER — Encounter (INDEPENDENT_AMBULATORY_CARE_PROVIDER_SITE_OTHER): Payer: Self-pay | Admitting: General Surgery

## 2013-09-16 ENCOUNTER — Ambulatory Visit (INDEPENDENT_AMBULATORY_CARE_PROVIDER_SITE_OTHER): Payer: BC Managed Care – PPO | Admitting: General Surgery

## 2013-09-16 VITALS — BP 116/80 | HR 84 | Temp 99.4°F | Resp 14 | Ht 67.5 in | Wt 225.8 lb

## 2013-09-16 DIAGNOSIS — D0590 Unspecified type of carcinoma in situ of unspecified breast: Secondary | ICD-10-CM

## 2013-09-16 DIAGNOSIS — D0502 Lobular carcinoma in situ of left breast: Secondary | ICD-10-CM

## 2013-09-16 NOTE — Patient Instructions (Signed)
Continue regular self exams  

## 2013-09-16 NOTE — Progress Notes (Signed)
Subjective:     Patient ID: [name], female   DOB: Nov 28, 1965, 48 y.o.   MRN: [id number]  HPI The patient is a 48 year old white female who is 3 months status post left breast lumpectomy for LCIS. She tried taking tamoxifen but developed some crampy abdominal pain with diarrhea. She stopped her tamoxifen but her symptoms have persisted. It is not clear whether her symptoms were related to the tamoxifen or not.  Review of Systems  Constitutional: Negative.   HENT: Negative.   Eyes: Negative.   Respiratory: Negative.   Cardiovascular: Negative.   Gastrointestinal: Positive for abdominal pain and diarrhea.  Endocrine: Negative.   Genitourinary: Negative.   Musculoskeletal: Negative.   Skin: Negative.   Allergic/Immunologic: Negative.   Neurological: Negative.   Hematological: Negative.   Psychiatric/Behavioral: Negative.        Objective:   Physical Exam  Constitutional: She is oriented to person, place, and time. She appears well-developed and well-nourished.  HENT:  Head: Normocephalic and atraumatic.  Eyes: Conjunctivae and EOM are normal. Pupils are equal, round, and reactive to light.  Neck: Normal range of motion. Neck supple.  Cardiovascular: Normal rate, regular rhythm and normal heart sounds.   Pulmonary/Chest: Effort normal and breath sounds normal.  Her left breast incision has healed nicely. There is no palpable mass in either breast. There is no palpable axillary, supraclavicular, or cervical lymphadenopathy  Abdominal: Soft. Bowel sounds are normal.  Musculoskeletal: Normal range of motion.  Lymphadenopathy:    She has no cervical adenopathy.  Neurological: She is alert and oriented to person, place, and time.  Skin: Skin is warm and dry.  Psychiatric: She has a normal mood and affect. Her behavior is normal.       Assessment:     The patient is 3 months status post left breast lumpectomy for LCIS     Plan:     At this point she will continue to do  regular self exams. She will try to resume tamoxifen at some point. I will plan to see her back in about 6 months. She will need annual mammography and MRI screening

## 2013-09-21 ENCOUNTER — Telehealth: Payer: Self-pay

## 2013-09-21 NOTE — Telephone Encounter (Signed)
Charting error.

## 2013-09-21 NOTE — Telephone Encounter (Signed)
Pt called requesting to go back on Tamoxifen.  States she has been off Tamoxifen for 2 weeks but her stomach problems have persisted and therefore does not feel they are related.  She states she will work with her GI MD to manage her stomach symptoms.

## 2013-09-21 NOTE — Telephone Encounter (Signed)
Pt stated she does have her Tamoxifen.

## 2013-09-21 NOTE — Telephone Encounter (Signed)
Let [name] know Dr. Humphrey Rolls is okay with her restarting her Tamoxifen 20 mg daily.  [name] verbalized understanding.           Shamaine, Mulkern - 09/21/2013 11:59 AM ','<More Detail >>       [name], MD       Sent: Wed September 21, 2013 12:21 PM    To: Prentiss Bells, RN                   Message             ----- Message -----    From: Prentiss Bells, RN    Sent: 09/21/2013 12:03 PM    To: [name], MD        ----- Message from Prentiss Bells, RN sent at 09/21/2013 12:03 PM -----     [name] wants to start back Tamoxifen. Please advise. Thx [name]                    Select [place] Size     Small Medium Large Extra Extra Large                [name] Description: 48 year old female  09/21/2013 Telephone Provider: Prentiss Bells, RN  MRN: [id number] Department: [facility]             Reason for Call     Advice Only     Restart Tamoxifen                  Call Documentation     [name], MD at 09/21/2013 12:21 PM     Status: Signed        Ok to go back on tamoxifen 20 mg daily as prescribed previously        Prentiss Bells, RN at 09/21/2013 12:00 PM     Status: Signed        [name] called requesting to go back on Tamoxifen. States she has been off Tamoxifen for 2 weeks but her stomach problems have persisted and therefore does not feel they are related. She states she will work with her GI MD to manage her stomach symptoms.                 Encounter MyChart Messages     No messages in this encounter             Routing History     Priority Sent On From To Message Type     09/21/2013 12:21 PM [name], MD Prentiss Bells, RN Patient Calls     09/21/2013 12:03 PM Prentiss Bells, RN [name], MD Patient Calls     Comment: [name] wants to start back Tamoxifen. Please advise. Thx [name]           Created by     Prentiss Bells, RN on 09/21/2013 11:59 AM                               Visit Pharmacy     [id number] - [place], [place] - [address] AT [address]             Contacts       Type Contact Phone    09/21/2013 11:59 AM Phone (Incoming) [place], [place]  R (Self) [phone] (W)

## 2013-09-21 NOTE — Telephone Encounter (Signed)
Ok to go back on tamoxifen 20 mg daily as prescribed previously

## 2013-10-04 ENCOUNTER — Encounter: Payer: BC Managed Care – PPO | Attending: Obstetrics and Gynecology | Admitting: Dietician

## 2013-10-04 ENCOUNTER — Encounter: Payer: Self-pay | Admitting: Dietician

## 2013-10-04 VITALS — Ht 67.5 in | Wt 227.4 lb

## 2013-10-04 DIAGNOSIS — E669 Obesity, unspecified: Secondary | ICD-10-CM

## 2013-10-04 DIAGNOSIS — Z713 Dietary counseling and surveillance: Secondary | ICD-10-CM | POA: Insufficient documentation

## 2013-10-04 NOTE — Patient Instructions (Addendum)
Try to incorporate exercise - walking at lunch, using the treadmill, aim to start with 10 minutes at a time. Consider measuring cereal (1 cup) and nuts (2 tablespoons) and split your pineapples (2 snacks per containers). Take time on weekend to chop up vegetables for salads and do some meal planning and/or cooking. Aim to fill up half your plate with vegetables even when going out to eat. When going out, box up half of your meal right away.  Drink mostly water or diet soda. If you want juice, have a little and cut it with seltzer water. Limit saturated and trans fat (hydrogenated oils).  Try whole wheat sandwich thins or English muffins for lunch.

## 2013-10-04 NOTE — Progress Notes (Signed)
  Medical Nutrition Therapy:  Appt start time: 0800 end time:  0900.   Assessment:  Primary concerns today: [name] is here today since her cholesterol is elevated and is also trying to lose weight. Was diagnosed with breast cancer in November and is currently on Tamoxifen. States she was underweight as a child, thin as a teenager, normal weight as an early adult, and starting gaining weight in the last 10 years. Has tried to lose weight by using supplements and watching her diet, but hasn't done a formal program.  Has a hx of diarrhea Questran prn for that. Has tried to make changes to her diet but has to help care for her father who has [employer]'s  disease. States that she is a stress eater. Had to put her cat to sleep on Friday and sister also has breast cancer. [name] stated that she is not interested in counseling at this time.   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 Goes out to eat at [facility], [employer] 2-3 x week. Not currently working out but has in the past. Sometimes will skip meals. Doesn't sleep well.      Preferred Learning Style:   No preference indicated   Learning Readiness:   Ready  MEDICATIONS: see list    DIETARY INTAKE:  Avoided foods include: asparagus, broccoli, cauliflower, bananas, brussels sprouts, iceberg lettuce    24-hr recall:  B ( AM): Honey Nut Cheerios or [employer] with skim milk an low fat yogurt and pineapple with coffee with creamer Snk ( AM): string cheese or nuts   L ( PM): sandwich on white bread with deli meat or peanut butter and honey with carrots and fruit Snk ( PM): none D ( PM): chicken, spaghetti, hamburger, hot dog, pizza with vegetables or salad with water, milk, diet soda Snk ( PM): depends on the stress level - crackers with cheese, cereal, chips  Beverages: 1 regular soda per day, skim milk, water  Usual physical activity: none  Estimated energy needs: 1800 calories 200 g carbohydrates 135  g protein 50 g fat  Progress Towards Goal(s):  In progress.   Nutritional Diagnosis:  [place]-3.3 Overweight/obesity As related to hx of large portion sizes and stress eating.  As evidenced by BMI of 35.1 and elevated LDL.    Intervention:  Nutrition counseling provided. Plan: Try to incorporate exercise - walking at lunch, using the treadmill, aim to start with 10 minutes at a time. Consider measuring cereal (1 cup) and nuts (2 tablespoons) and split your pineapples (2 snacks per containers). Take time on weekend to chop up vegetables for salads and do some meal planning and/or cooking. Aim to fill up half your plate with vegetables even when going out to eat. When going out, box up half of your meal right away.  Drink mostly water or diet soda. If you want juice, have a little and cut it with seltzer water. Limit saturated and trans fat (hydrogenated oils).  Try whole wheat sandwich thins or English muffins for lunch.   Teaching Method Utilized:  Visual Auditory Hands on  Handouts given during visit include:  MyPlate Handout  15 g CHO Snacks  Barriers to learning/adherence to lifestyle change: stress, care-taking, limited time  Demonstrated degree of understanding via:  Teach Back   Monitoring/Evaluation:  Dietary intake, exercise, and body weight in 2 month(s).

## 2013-10-19 ENCOUNTER — Encounter: Payer: Self-pay | Admitting: Obstetrics and Gynecology

## 2013-12-05 ENCOUNTER — Ambulatory Visit: Payer: BC Managed Care – PPO | Admitting: Dietician

## 2013-12-06 ENCOUNTER — Telehealth: Payer: Self-pay | Admitting: *Deleted

## 2013-12-06 NOTE — Telephone Encounter (Signed)
[name] brought it to my attention that the pt had not been rescheduled.  Spoke with Mendel Ryder and she is willing to see pt.  Called pt and informed her that Dr. Humphrey Rolls is out on a LOA and Mendel Ryder could see her.  She was fine with this.  Confirmed date and time w/ pt.

## 2013-12-07 ENCOUNTER — Encounter: Payer: Self-pay | Admitting: Adult Health

## 2013-12-07 ENCOUNTER — Other Ambulatory Visit (HOSPITAL_BASED_OUTPATIENT_CLINIC_OR_DEPARTMENT_OTHER): Payer: BC Managed Care – PPO

## 2013-12-07 ENCOUNTER — Ambulatory Visit (HOSPITAL_BASED_OUTPATIENT_CLINIC_OR_DEPARTMENT_OTHER): Payer: BC Managed Care – PPO | Admitting: Adult Health

## 2013-12-07 VITALS — BP 125/80 | HR 72 | Temp 98.8°F | Resp 18 | Ht 67.5 in | Wt 220.3 lb

## 2013-12-07 DIAGNOSIS — D0500 Lobular carcinoma in situ of unspecified breast: Secondary | ICD-10-CM

## 2013-12-07 DIAGNOSIS — D0502 Lobular carcinoma in situ of left breast: Secondary | ICD-10-CM

## 2013-12-07 DIAGNOSIS — D0590 Unspecified type of carcinoma in situ of unspecified breast: Secondary | ICD-10-CM

## 2013-12-07 LAB — CBC WITH DIFFERENTIAL/PLATELET
BASO%: 0.8 % (ref 0.0–2.0)
BASOS ABS: 0.1 10*3/uL (ref 0.0–0.1)
EOS%: 3.7 % (ref 0.0–7.0)
Eosinophils Absolute: 0.2 10*3/uL (ref 0.0–0.5)
HCT: 40.4 % (ref 34.8–46.6)
HEMOGLOBIN: 13.5 g/dL (ref 11.6–15.9)
LYMPH#: 2.6 10*3/uL (ref 0.9–3.3)
LYMPH%: 42.2 % (ref 14.0–49.7)
MCH: 30.3 pg (ref 25.1–34.0)
MCHC: 33.4 g/dL (ref 31.5–36.0)
MCV: 90.8 fL (ref 79.5–101.0)
MONO#: 0.5 10*3/uL (ref 0.1–0.9)
MONO%: 8.1 % (ref 0.0–14.0)
NEUT#: 2.8 10*3/uL (ref 1.5–6.5)
NEUT%: 45.2 % (ref 38.4–76.8)
Platelets: 243 10*3/uL (ref 145–400)
RBC: 4.45 10*6/uL (ref 3.70–5.45)
RDW: 12.7 % (ref 11.2–14.5)
WBC: 6.2 10*3/uL (ref 3.9–10.3)

## 2013-12-07 LAB — COMPREHENSIVE METABOLIC PANEL (CC13)
ALBUMIN: 3.9 g/dL (ref 3.5–5.0)
ALT: 14 U/L (ref 0–55)
ANION GAP: 8 meq/L (ref 3–11)
AST: 14 U/L (ref 5–34)
Alkaline Phosphatase: 45 U/L (ref 40–150)
BUN: 7.2 mg/dL (ref 7.0–26.0)
CALCIUM: 9.4 mg/dL (ref 8.4–10.4)
CHLORIDE: 107 meq/L (ref 98–109)
CO2: 25 mEq/L (ref 22–29)
CREATININE: 0.8 mg/dL (ref 0.6–1.1)
Glucose: 103 mg/dl (ref 70–140)
POTASSIUM: 4 meq/L (ref 3.5–5.1)
Sodium: 139 mEq/L (ref 136–145)
Total Bilirubin: 0.3 mg/dL (ref 0.20–1.20)
Total Protein: 7 g/dL (ref 6.4–8.3)

## 2013-12-07 NOTE — Progress Notes (Signed)
ID: [name] OB: Dec 16, 1965  MR#: [id number]  CSN#:[id number]  PCP: Darlyn Chamber, MD GYN:   SU: Dr. Marlou Starks OTHER MD:  CHIEF COMPLAINT: H/o LCIS here for f/u.    BREAST CANCER HISTORY: Patient underwent a screening mammogram on 04/18/2013 which did not show anything. However she had MRI study of both breasts because of her strong family history. She was found to have 2 abnormalities in the 12:00 position of the left breast. Both of these were biopsied. 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 Patient has had a long history of occasional pain in both breasts. She has no other issues related to that breasts. Patient does have a sister who had breast cancer at the age of 42. She also had mother who had breast cancer at the age of 76.   CURRENT THERAPY:  Tamoxifen 47m daily   INTERVAL HISTORY: Patient is here today for f/u of her LCIS.  She is doing moderately well today.  She is taking Tamoxifen 471mdaily and is tolerating it relatively well.  She denies any new pain, fevers, chills, breast changes, nausea, vomiting, unintentional weight loss, or any further concerns. We reviewed her health maintenance below.    REVIEW OF SYSTEMS:A 10 point review of systems was conducted and is otherwise negative except for what is noted above.     PAST MEDICAL HISTORY: Past Medical History  Diagnosis Date  . Hyperlipidemia   . History of anemia   . Hypothyroidism   . Chronic diarrhea   . Cancer     left breast  . Complication of anesthesia     woke up agitated after gum surgery    PAST SURGICAL HISTORY: Past Surgical History  Procedure Laterality Date  . Gum surgery    . Breast lumpectomy with needle localization Left 06/20/2013    Procedure: BREAST LUMPECTOMY WITH NEEDLE LOCALIZATION;  Surgeon: PaMerrie RoofMD;  [facility]: [facility];  Laterality: Left;    FAMILY HISTORY Family  History  Problem Relation Age of Onset  . Breast cancer Mother 5357. Melanoma Father 7067. Breast cancer Sister 485  ER+/her2-; onco score = 8  . Cancer Maternal Aunt     oral cancer; smoker  . Head & neck cancer Paternal Uncle     smoker  . Breast cancer Cousin     dx in her late [id number]  GYNECOLOGIC HISTORY: menarche at age [id number] P0no h/o sexually transmitted infections, hormone replacement therapy, or abnormal pap smears.  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    HEALTH MAINTENANCE: History  Substance Use Topics  . Smoking status: Never Smoker   . Smokeless tobacco: Never Used  . Alcohol Use: No   Mammogram:  04/18/2013 Colonoscopy: 2-3 years ago, unsure of f/u Bone Density Scan: n/a Pap Smear: 03/2013 Eye Exam: 5 years ago Vitamin D Level:  n/a Lipid Panel: 03/2013   Allergies  Allergen Reactions  . Penicillins Rash    Current Outpatient Prescriptions  Medication Sig Dispense Refill  . Calcium Carbonate-Vitamin D (CALCIUM + D PO) Take by mouth.      .Marland Kitchen  cholestyramine (QUESTRAN) 4 GM/DOSE powder Take by mouth 3 (three) times daily with meals.      . hyoscyamine (LEVSIN) 0.125 MG/ML solution Take 0.125 mg by mouth every 4 (four) hours as needed.      Marland Kitchen KRILL OIL OMEGA-3 PO Take by mouth.      . levothyroxine (SYNTHROID, LEVOTHROID) 112 MCG tablet Take 112 mcg by mouth daily before breakfast.      . Multiple Vitamin (MULTIVITAMIN) tablet Take 1 tablet by mouth daily.      . tamoxifen (NOLVADEX) 20 MG tablet Take 20 mg by mouth daily.      Marland Kitchen aspirin-acetaminophen-caffeine (EXCEDRIN MIGRAINE) 250-250-65 MG per tablet Take by mouth every 6 (six) hours as needed for headache.       No current facility-administered  medications for this visit.    OBJECTIVE: Filed Vitals:   12/07/13 1236  BP: 125/80  Pulse: 72  Temp: 98.8 F (37.1 C)  Resp: 18     Body mass index is 33.97 kg/(m^2).     GENERAL: Patient is a well appearing female in no acute distress HEENT:  Sclerae anicteric.  Oropharynx clear and moist. No ulcerations or evidence of oropharyngeal candidiasis. Neck is supple.  NODES:  No cervical, supraclavicular, or axillary lymphadenopathy palpated.  BREAST EXAM:  Deferred. LUNGS:  Clear to auscultation bilaterally.  No wheezes or rhonchi. HEART:  Regular rate and rhythm. No murmur appreciated. ABDOMEN:  Soft, nontender.  Positive, normoactive bowel sounds. No organomegaly palpated. MSK:  No focal spinal tenderness to palpation. Full range of motion bilaterally in the upper extremities. EXTREMITIES:  No peripheral edema.   SKIN:  Clear with no obvious rashes or skin changes. No nail dyscrasia. NEURO:  Nonfocal. Well oriented.  Appropriate affect. ECOG FS:1 - Symptomatic but completely ambulatory  LAB RESULTS:  CMP  No results found for this basename: na, k, cl, co2, glucose, bun, creatinine, calcium, prot, albumin, ast, alt, alkphos, bilitot, gfrnonaa, gfraa    I No results found for this basename: SPEP, UPEP,  kappa and lambda light chains    Lab Results  Component Value Date   WBC 6.2 12/07/2013   NEUTROABS 2.8 12/07/2013   HGB 13.5 12/07/2013   HCT 40.4 12/07/2013   MCV 90.8 12/07/2013   PLT 243 12/07/2013      Chemistry   No results found for this basename: NA, K, CL, CO2, BUN, CREATININE, GLU   No results found for this basename: CALCIUM, ALKPHOS, AST, ALT, BILITOT       No results found for this basename: LABCA2    No components found with this basename: LABCA125    No results found for this basename: INR,  in the last 168 hours  Urinalysis No results found for this basename: colorurine, appearanceur, labspec, phurine, glucoseu, hgbur, bilirubinur, ketonesur,  proteinur, urobilinogen, nitrite, leukocytesur    STUDIES: No results found.  ASSESSMENT: 48 y.o. with h/o left breast LCIS.  1. Patient underwent lumpectomy by Dr. Marlou Starks on 06/20/14 that demonstrated LCIS  2.  Patient was started on Tamoxifen 30m daily in January, 2015.    PLAN:  Patient is here for evaluation.  She is taking tamoxifen and tolerating it well.  We discussed weight loss in great detail today.  We discussed healthy diet and exercise as risk reduction.  We discussed monthly self breast exams.  Due to her previous mammogram not detecting her breast abnormality, she will need a breast mammogram and MRI in September.  She is in agreement  with this.  We will see her back in 6 months for labs and follow up.     She knows to call us in the interim for any questions or concerns.  We can certainly see her sooner if needed.  I spent 25 minutes counseling the patient face to face.  The total time spent in the appointment was 30 minutes.   Minette Headland, [employer] [phone] 12/07/2013 1:04 PM

## 2013-12-13 ENCOUNTER — Telehealth: Payer: Self-pay | Admitting: Adult Health

## 2013-12-13 NOTE — Telephone Encounter (Signed)
s/w re appts for 06/12/14 and mammo for 04/19/14 (already on schedule). central will call pt re mri to be done @ [facility] - pt aware.

## 2013-12-15 ENCOUNTER — Telehealth: Payer: Self-pay | Admitting: Adult Health

## 2013-12-15 NOTE — Telephone Encounter (Signed)
, °

## 2014-02-10 ENCOUNTER — Other Ambulatory Visit: Payer: Self-pay

## 2014-02-10 ENCOUNTER — Ambulatory Visit (HOSPITAL_BASED_OUTPATIENT_CLINIC_OR_DEPARTMENT_OTHER): Payer: BC Managed Care – PPO | Admitting: Nurse Practitioner

## 2014-02-10 ENCOUNTER — Encounter: Payer: Self-pay | Admitting: Nurse Practitioner

## 2014-02-10 ENCOUNTER — Telehealth: Payer: Self-pay | Admitting: *Deleted

## 2014-02-10 ENCOUNTER — Encounter (HOSPITAL_COMMUNITY): Payer: Self-pay | Admitting: Emergency Medicine

## 2014-02-10 ENCOUNTER — Emergency Department (HOSPITAL_COMMUNITY): Payer: BC Managed Care – PPO

## 2014-02-10 ENCOUNTER — Emergency Department (HOSPITAL_COMMUNITY)
Admission: EM | Admit: 2014-02-10 | Discharge: 2014-02-10 | Disposition: A | Discharge status: Home / Self Care | Payer: BC Managed Care – PPO | Attending: Emergency Medicine | Admitting: Emergency Medicine

## 2014-02-10 VITALS — BP 112/62 | HR 75 | Temp 98.2°F | Resp 19 | Ht 67.5 in | Wt 215.0 lb

## 2014-02-10 DIAGNOSIS — C50919 Malignant neoplasm of unspecified site of unspecified female breast: Secondary | ICD-10-CM | POA: Insufficient documentation

## 2014-02-10 DIAGNOSIS — R232 Flushing: Secondary | ICD-10-CM

## 2014-02-10 DIAGNOSIS — M791 Myalgia, unspecified site: Secondary | ICD-10-CM | POA: Insufficient documentation

## 2014-02-10 DIAGNOSIS — R197 Diarrhea, unspecified: Secondary | ICD-10-CM | POA: Insufficient documentation

## 2014-02-10 DIAGNOSIS — R071 Chest pain on breathing: Secondary | ICD-10-CM

## 2014-02-10 DIAGNOSIS — IMO0001 Reserved for inherently not codable concepts without codable children: Secondary | ICD-10-CM

## 2014-02-10 DIAGNOSIS — E039 Hypothyroidism, unspecified: Secondary | ICD-10-CM | POA: Diagnosis not present

## 2014-02-10 DIAGNOSIS — Z862 Personal history of diseases of the blood and blood-forming organs and certain disorders involving the immune mechanism: Secondary | ICD-10-CM | POA: Insufficient documentation

## 2014-02-10 DIAGNOSIS — R002 Palpitations: Secondary | ICD-10-CM | POA: Diagnosis not present

## 2014-02-10 DIAGNOSIS — R079 Chest pain, unspecified: Secondary | ICD-10-CM | POA: Diagnosis present

## 2014-02-10 DIAGNOSIS — T451X5A Adverse effect of antineoplastic and immunosuppressive drugs, initial encounter: Secondary | ICD-10-CM | POA: Insufficient documentation

## 2014-02-10 DIAGNOSIS — D0590 Unspecified type of carcinoma in situ of unspecified breast: Secondary | ICD-10-CM

## 2014-02-10 DIAGNOSIS — Z880 Allergy status to penicillin: Secondary | ICD-10-CM | POA: Insufficient documentation

## 2014-02-10 DIAGNOSIS — R9431 Abnormal electrocardiogram [ECG] [EKG]: Secondary | ICD-10-CM | POA: Insufficient documentation

## 2014-02-10 DIAGNOSIS — E785 Hyperlipidemia, unspecified: Secondary | ICD-10-CM | POA: Insufficient documentation

## 2014-02-10 DIAGNOSIS — Z79899 Other long term (current) drug therapy: Secondary | ICD-10-CM | POA: Diagnosis not present

## 2014-02-10 DIAGNOSIS — D0502 Lobular carcinoma in situ of left breast: Secondary | ICD-10-CM

## 2014-02-10 LAB — I-STAT TROPONIN, ED
Troponin i, poc: 0 ng/mL (ref 0.00–0.08)
Troponin i, poc: 0 ng/mL (ref 0.00–0.08)

## 2014-02-10 LAB — CBC
HCT: 38.6 % (ref 36.0–46.0)
Hemoglobin: 13.2 g/dL (ref 12.0–15.0)
MCH: 30.6 pg (ref 26.0–34.0)
MCHC: 34.2 g/dL (ref 30.0–36.0)
MCV: 89.4 fL (ref 78.0–100.0)
Platelets: 231 10*3/uL (ref 150–400)
RBC: 4.32 MIL/uL (ref 3.87–5.11)
RDW: 12.3 % (ref 11.5–15.5)
WBC: 7.5 10*3/uL (ref 4.0–10.5)

## 2014-02-10 LAB — BASIC METABOLIC PANEL
Anion gap: 12 (ref 5–15)
BUN: 8 mg/dL (ref 6–23)
CO2: 24 mEq/L (ref 19–32)
Calcium: 9.6 mg/dL (ref 8.4–10.5)
Chloride: 102 mEq/L (ref 96–112)
Creatinine, Ser: 0.8 mg/dL (ref 0.50–1.10)
GFR calc Af Amer: >90 mL/min (ref >90)
GFR calc non Af Amer: 86 mL/min — ABNORMAL LOW (ref >90)
Glucose, Bld: 96 mg/dL (ref 70–99)
Potassium: 3.7 mEq/L (ref 3.7–5.3)
Sodium: 138 mEq/L (ref 137–147)

## 2014-02-10 LAB — D-DIMER, QUANTITATIVE: D-Dimer, Quant: 0.28 ug/mL-FEU (ref 0.00–0.48)

## 2014-02-10 NOTE — Telephone Encounter (Signed)
Pt called with c/o chest pain last night to left side. 5/10, sharp and stabbing. Intermittent. Pt denied need to go to ED. Pt relay this has been going on since May and that Mendel Ryder had attempted to send her to a cardiologist at the time, but she didn't feel the need for it. Left breast affected side for breast cancer. Questioned if it was near her lumpectomy site. Pt relay it is more toward the middle of her chest. Scheduled pt to come in and see Cyndee Bacon at 1:00 per pt request. Gave Cyndee pt complaint and update.

## 2014-02-10 NOTE — Progress Notes (Signed)
[facility]   Chief Complaint  Patient presents with  . Chest Pain    HPI: [name] 48 y.o. female diagnosed with breast cancer.  Patient is status post left breast lumpectomy.  The patient initiated tamoxifen therapy in January 2015.  Patient called the cancer center today requesting an urgent care visit.  She states that she has had some chronic pleural chest wall discomfort since May 2015.  She reached up in her closet last night and experienced a sharp central chest pain.  As she states that the pain is intermittent.  She states that this pain does occasionally radiate up her left neck; and occasionally is radiating to her left back as well.  He also states that there is some chest pressure associated as well.  She denies any shortness of breath whatsoever.  He also denies any recent fever or chills appear patient also states that she has been experiencing some mild, generalized joint pain in occasional hot flashes since initiating her tamoxifen therapy.  She feels these symptoms are tolerable at present.  CURRENT THERAPY: Tamoxifen 20 mg per day.  Past Medical History  Diagnosis Date  . Hyperlipidemia   . History of anemia   . Hypothyroidism   . Chronic diarrhea   . Cancer     left breast  . Complication of anesthesia     woke up agitated after gum surgery    Past Surgical History  Procedure Laterality Date  . Gum surgery    . Breast lumpectomy with needle localization Left 06/20/2013    Procedure: BREAST LUMPECTOMY WITH NEEDLE LOCALIZATION;  Surgeon: Merrie Roof, MD;  Location: [facility];  Service: General;  Laterality: Left;    has Neoplasm of left breast, primary tumor staging category Tis; Chest pain; Myalgia; and Hot flashes due to tamoxifen on her problem list.     is allergic to penicillins.    Medication List       This list is accurate as of: 02/10/14  1:43 PM.  Always use your most recent med list.               aspirin-acetaminophen-caffeine 250-250-65 MG per tablet  Commonly known as:  EXCEDRIN MIGRAINE  Take by mouth every 6 (six) hours as needed for headache.     CALCIUM + D PO  Take by mouth.     cholestyramine 4 GM/DOSE powder  Commonly known as:  QUESTRAN  Take by mouth 3 (three) times daily with meals.     hyoscyamine 0.125 MG/ML solution  Commonly known as:  LEVSIN  Take 0.125 mg by mouth every 4 (four) hours as needed.     KRILL OIL OMEGA-3 PO  Take by mouth.     levothyroxine 112 MCG tablet  Commonly known as:  SYNTHROID, LEVOTHROID  Take 112 mcg by mouth daily before breakfast.     multivitamin tablet  Take 1 tablet by mouth daily.     tamoxifen 20 MG tablet  Commonly known as:  NOLVADEX  Take 20 mg by mouth daily.          PHYSICAL EXAMINATION  Filed Vitals:   02/10/14 1303  BP: 112/62  Pulse: 75  Temp: 98.2 F (36.8 C)  Resp: 19    GENERAL:alert, healthy, no distress, well nourished, well developed and obese SKIN: skin color, texture, turgor are normal, no rashes or significant lesions HEAD: Normocephalic, No masses, lesions, tenderness or abnormalities EYES: PERRLA, Conjunctiva are  pink and non-injected OROPHARYNX:lips, buccal mucosa, and tongue normal  NECK: supple, no adenopathy, no bruits, no JVD LYMPH:  no palpable lymphadenopathy BREAST:unchanged from previous exams LUNGS: negative findings:  chest symmetric with normal A/P diameter, no chest deformities noted, normal respiratory rate and rhythm, no chest wall tenderness, diaphragmatic excursion normal, lungs clear to auscultation HEART: regular rate & rhythm, no murmurs and no gallops ABDOMEN:abdomen soft, non-tender, obese, normal bowel sounds and no masses or organomegaly BACK: No CVA tenderness, Range of motion is normal EXTREMITIES:no edema, no clubbing, no cyanosis  NEURO: alert & oriented x 3 with fluent speech, gait normal  LABORATORY DATA:.None    RADIOGRAPHIC STUDIES:  None   ASSESSMENT/PLAN:    Neoplasm of left breast, primary tumor staging category Tis  Assessment & Plan Patient initiated tamoxifen therapy in January 2015.  She is currently taking tamoxifen 20 mg on a daily basis.  She is due for her next mammogram and breast MRI in September 2015.  Her next visit here at the cancer center with labs and follow up exam will be due in November 2015.   Chest pain  Assessment & Plan Patient has been experiencing some dull chest wall discomfort since May 2015.  She states that this chest pain did become more intense over the last 24 hours.  She denies any shortness of breath whatsoever.  She does not appear to be in any distress whatsoever at this time.  After careful discussion with the patient and her husband-patient was instructed to go to the emergency department for further evaluation of her chest discomfort.  Patient and her husband were very comfortable with this plan.  Patient declined wheelchair assistance to the emergency department; and insisted on walking to the emergency department with her husband.   Myalgia  Assessment & Plan Patient has experienced some very mild, chronic generalized joint discomfort since initiating her tamoxifen therapy.  She was encouraged to increase her activity level is much as possible in hopes that this will help with her joint aches.   Hot flashes due to tamoxifen  Assessment & Plan Patient's hot flashes are most likely due to tamoxifen therapy.  They do appear to be mild at present.  Patient will go to the Emergency Department for further evaluation of her chest pain. Brief report called to ED Charge Nurse by provider.   Patient stated understanding of all instructions; and was in agreement with this plan of care. The patient knows to call the clinic with any problems, questions or concerns.   Total time spent with patient was 25 minutes;  with greater than 75 percent of that time spent in face to face  counseling regarding patient's symptoms, tamoxifen side effects and management, and plan for further eval in ED;  and coordination of care and follow up.  Disclaimer: This note was dictated with voice recognition software. Similar sounding words can inadvertently be transcribed and may not be corrected upon review.   Drue Second, NP 02/10/2014

## 2014-02-10 NOTE — ED Notes (Signed)
Pt in from [facility]. C/o pleuritic chest pain, ongoing since May this year. Worse in last 24 hours since reaching up into closet last night. Denies shob, radiating pain. Seen at [facility] by NP and sent here for further eval. Currently undergoing chemo tx for breast Ca.

## 2014-02-10 NOTE — Discharge Instructions (Signed)
Call for a follow up appointment with a Family or Primary Care Provider.  Call a Cardiologist for further evaluation of your abnormal EKG and further evaluation of your chest pain. Return if Symptoms worsen.   Take medication as prescribed.

## 2014-02-10 NOTE — Assessment & Plan Note (Signed)
Patient's hot flashes are most likely due to tamoxifen therapy.  They do appear to be mild at present.

## 2014-02-10 NOTE — Assessment & Plan Note (Signed)
Patient has been experiencing some dull chest wall discomfort since May 2015.  She states that this chest pain did become more intense over the last 24 hours.  She denies any shortness of breath whatsoever.  She does not appear to be in any distress whatsoever at this time.  After careful discussion with the patient and her husband-patient was instructed to go to the emergency department for further evaluation of her chest discomfort.  Patient and her husband were very comfortable with this plan.  Patient declined wheelchair assistance to the emergency department; and insisted on walking to the emergency department with her husband.

## 2014-02-10 NOTE — Assessment & Plan Note (Signed)
Patient has experienced some very mild, chronic generalized joint discomfort since initiating her tamoxifen therapy.  She was encouraged to increase her activity level is much as possible in hopes that this will help with her joint aches.

## 2014-02-10 NOTE — ED Provider Notes (Signed)
CSN: [id number]     Arrival date & time 02/10/14  1357 History   First MD Initiated Contact with Patient 02/10/14 1501     Chief Complaint  Patient presents with  . Chest Pain     (Consider location/radiation/quality/duration/timing/severity/associated sxs/prior Treatment) HPI Comments: [name] is a 48 y.o. female with a past medical history of breast cancer presenting from the Cancer center the Emergency Department with a chief complaint of left sided chest discomfort since last night.  The patient reports sharp constant pain with radiation into upper back and upper extremity.  She reports sharp discomfort self resolved around 0530-0600.  She reports persistent chest tightness, self resolved a few hours ago. Denies chest discomfort at this time. Denies shortness of breath, pleurisy, increase in discomfort with exertion. Patient reports palpitations last night, described as fluttering in. She reports she has experience these palpitations intermittently for several months, and no aggravating or relieving factors. Denies fever, cough, nausea, vomiting, diaphoresis, numbness, tingling. The patient reports intermittent chest discomfort since May 2015. Aggravated by body movement.  Denies history of murmur, previous MI, arrythmia, or family history of early MI.  No recent travel, family history or personal history of DVT/PE, lower extremity swelling, or recent travel. Patient's last menstrual period was 02/01/2014. PCP: Darlyn Chamber, MD     The history is provided by the patient. No language interpreter was used.    Past Medical History  Diagnosis Date  . Hyperlipidemia   . History of anemia   . Hypothyroidism   . Chronic diarrhea   . Cancer     left breast  . Complication of anesthesia     woke up agitated after gum surgery   Past Surgical History  Procedure Laterality Date  . Gum surgery    . Breast lumpectomy with needle localization Left 06/20/2013    Procedure: BREAST  LUMPECTOMY WITH NEEDLE LOCALIZATION;  Surgeon: Merrie Roof, MD;  Location: [facility];  Service: General;  Laterality: Left;   Family History  Problem Relation Age of Onset  . Breast cancer Mother 93  . Melanoma Father 63  . Breast cancer Sister 86    ER+/her2-; onco score = 8  . Cancer Maternal Aunt     oral cancer; smoker  . Head & neck cancer Paternal Uncle     smoker  . Breast cancer Cousin     dx in her late 110s-60s   History  Substance Use Topics  . Smoking status: Never Smoker   . Smokeless tobacco: Never Used  . Alcohol Use: No   OB History   Grav Para Term Preterm Abortions TAB SAB Ect Mult Living                 Review of Systems  Constitutional: Negative for fever, chills and diaphoresis.  Respiratory: Positive for chest tightness. Negative for cough and shortness of breath.   Cardiovascular: Positive for chest pain and palpitations. Negative for leg swelling.  Gastrointestinal: Negative for nausea, vomiting and abdominal pain.  Skin: Negative for rash.  Neurological: Negative for weakness.      Allergies  Penicillins  Home Medications   Prior to Admission medications   Medication Sig Start Date End Date Taking? Authorizing Provider  aspirin-acetaminophen-caffeine (EXCEDRIN MIGRAINE) [phone] MG per tablet Take by mouth every 6 (six) hours as needed for headache.   Yes Historical Provider, MD  Calcium Carbonate-Vitamin D (CALCIUM + D PO) Take by mouth.   Yes Historical  Provider, MD  Cholecalciferol (VITAMIN D) 2000 UNITS tablet Take 2,000 Units by mouth daily.   Yes Historical Provider, MD  cholestyramine Lucrezia Starch) 4 GM/DOSE powder Take by mouth 3 (three) times daily with meals.   Yes Historical Provider, MD  hyoscyamine (ANASPAZ) 0.125 MG TBDP disintergrating tablet Place 0.125 mg under the tongue every 4 (four) hours as needed for cramping.   Yes Historical Provider, MD  KRILL OIL OMEGA-3 PO Take by mouth.   Yes Historical Provider,  MD  levothyroxine (SYNTHROID, LEVOTHROID) 112 MCG tablet Take 112 mcg by mouth daily before breakfast.   Yes Historical Provider, MD  Multiple Vitamin (MULTIVITAMIN) tablet Take 1 tablet by mouth daily.   Yes Historical Provider, MD  tamoxifen (NOLVADEX) 20 MG tablet Take 20 mg by mouth daily.   Yes Historical Provider, MD   BP 119/76  Pulse 79  Temp(Src) 98.4 F (36.9 C) (Oral)  Resp 17  SpO2 97%  LMP 02/01/2014 Physical Exam  Nursing note and vitals reviewed. Constitutional: She is oriented to person, place, and time. She appears well-developed and well-nourished.  Non-toxic appearance. She does not have a sickly appearance. She does not appear ill. No distress.  HENT:  Head: Normocephalic and atraumatic.  Eyes: EOM are normal. Pupils are equal, round, and reactive to light. Right eye exhibits no discharge. Left eye exhibits no discharge. No scleral icterus.  Neck: Normal range of motion. Neck supple.  Cardiovascular: Normal rate and regular rhythm.   No murmur heard. Pulses:      Radial pulses are 2+ on the right side, and 2+ on the left side.  No lower extremity edema  Pulmonary/Chest: Effort normal and breath sounds normal. She has no wheezes. She has no rales. She exhibits tenderness.  Patient is able to speak in complete sentences. Mild tenderness to anterior chest wall.  Abdominal: Soft. Bowel sounds are normal. She exhibits no distension. There is no tenderness. There is no rebound and no guarding.  Musculoskeletal: Normal range of motion. She exhibits no edema.  Neurological: She is alert and oriented to person, place, and time.  Skin: Skin is warm and dry. No rash noted. She is not diaphoretic.  Psychiatric: She has a normal mood and affect. Her behavior is normal. Thought content normal.    ED Course  Procedures (including critical care time) Labs Review Results for orders placed during the hospital encounter of 02/10/14  CBC      Result Value Ref Range   WBC 7.5   4.0 - 10.5 K/uL   RBC 4.32  3.87 - 5.11 MIL/uL   Hemoglobin 13.2  12.0 - 15.0 g/dL   HCT 38.6  36.0 - 46.0 %   MCV 89.4  78.0 - 100.0 fL   MCH 30.6  26.0 - 34.0 pg   MCHC 34.2  30.0 - 36.0 g/dL   RDW 12.3  11.5 - 15.5 %   Platelets 231  150 - 400 K/uL  BASIC METABOLIC PANEL      Result Value Ref Range   Sodium 138  137 - 147 mEq/L   Potassium 3.7  3.7 - 5.3 mEq/L   Chloride 102  96 - 112 mEq/L   CO2 24  19 - 32 mEq/L   Glucose, Bld 96  70 - 99 mg/dL   BUN 8  6 - 23 mg/dL   Creatinine, Ser 0.80  0.50 - 1.10 mg/dL   Calcium 9.6  8.4 - 10.5 mg/dL   GFR calc non Af Amer 86 (*) >  90 mL/min   GFR calc Af Amer >90  >90 mL/min   Anion gap 12  5 - 15  D-DIMER, QUANTITATIVE      Result Value Ref Range   D-Dimer, Quant 0.28  0.00 - 0.48 ug/mL-FEU  I-STAT TROPOININ, ED      Result Value Ref Range   Troponin i, poc 0.00  0.00 - 0.08 ng/mL   Comment 3           I-STAT TROPOININ, ED      Result Value Ref Range   Troponin i, poc 0.00  0.00 - 0.08 ng/mL   Comment 3            Dg Chest 2 View  02/10/2014   CLINICAL DATA:  Chest pain.  EXAM: CHEST  2 VIEW  COMPARISON:  None.  FINDINGS: The heart size and mediastinal contours are within normal limits. Both lungs are clear. No pneumothorax or pleural effusion is noted. The visualized skeletal structures are unremarkable.  IMPRESSION: No acute cardiopulmonary abnormality seen.   Electronically Signed   By: Sabino Dick M.D.   On: 02/10/2014 15:02    EKG Interpretation   Date/Time:  Friday February 10 2014 14:04:18 EDT Ventricular Rate:  84 PR Interval:  135 QRS Duration: 85 QT Interval:  378 QTC Calculation: 447 R Axis:   31 Text Interpretation:  Sinus rhythm Low voltage, precordial leads  Non-specific ST-t changes No old tracing to compare Confirmed by McClure   MD, [place]um ([id number]) on 02/10/2014 3:04:14 PM      MDM   Final diagnoses:  Chest pain, unspecified chest pain type  Abnormal EKG   Cancer presents with atypical chest pain. Chest  discomfort free in ED. EKG shows nonspecific ST changes. X-ray without acute findings. Patient has a history of cancer, suspicion for PE low due to denying shortness breath, pleurisy, cough, however with history of breast cancer d-dimer ordered. Chest x-ray, initial troponin, BNP, d-dimer negative for concerning abnormalities. Reevaluation patient resting comfortably in room reports one episode of chest tightness lasting less than several seconds emergency apartment similar to previous. Declines pain medication or any medication therapy at this time. Negative troponin x2. Chest discomfort likely musculoskeletal. Discussed lab results, imaging results, and treatment plan with the patient. Return precautions given. Reports understanding and no other concerns at this time.  Patient is stable for discharge at this time.   Meds given in ED:  Medications - No data to display  Discharge Medication List as of 02/10/2014  6:47 PM       Ander Purpura Burnetta Sabin, PA-C 02/12/14 0105

## 2014-02-10 NOTE — Assessment & Plan Note (Addendum)
Patient initiated tamoxifen therapy in January 2015.  She is currently taking tamoxifen 20 mg on a daily basis.  She is due for her next mammogram and breast MRI in September 2015.  Her next visit here at the cancer center with labs and follow up exam will be due in November 2015.

## 2014-02-12 ENCOUNTER — Telehealth: Payer: Self-pay | Admitting: Oncology

## 2014-02-12 NOTE — ED Provider Notes (Signed)
Medical screening examination/treatment/procedure(s) were performed by non-physician practitioner and as supervising physician I was immediately available for consultation/collaboration.   EKG Interpretation   Date/Time:  Friday February 10 2014 14:04:18 EDT Ventricular Rate:  84 PR Interval:  135 QRS Duration: 85 QT Interval:  378 QTC Calculation: 447 R Axis:   31 Text Interpretation:  Sinus rhythm Low voltage, precordial leads  Non-specific ST-t changes No old tracing to compare Confirmed by Wilson Singer   MD, [place] [phone]) on 02/10/2014 3:04:14 PM       Virgel Manifold, MD 02/12/14 2141

## 2014-02-12 NOTE — Telephone Encounter (Signed)
pt seen by CB 7/17 and already on schedule for mammo and next appts. central will contact pt re mri.

## 2014-03-15 ENCOUNTER — Ambulatory Visit (INDEPENDENT_AMBULATORY_CARE_PROVIDER_SITE_OTHER): Payer: BC Managed Care – PPO | Admitting: Cardiology

## 2014-03-15 ENCOUNTER — Encounter: Payer: Self-pay | Admitting: Cardiology

## 2014-03-15 ENCOUNTER — Encounter: Payer: Self-pay | Admitting: *Deleted

## 2014-03-15 VITALS — BP 122/78 | HR 88 | Ht 67.5 in | Wt 214.8 lb

## 2014-03-15 DIAGNOSIS — D0590 Unspecified type of carcinoma in situ of unspecified breast: Secondary | ICD-10-CM

## 2014-03-15 DIAGNOSIS — R072 Precordial pain: Secondary | ICD-10-CM

## 2014-03-15 DIAGNOSIS — R079 Chest pain, unspecified: Secondary | ICD-10-CM

## 2014-03-15 DIAGNOSIS — D0502 Lobular carcinoma in situ of left breast: Secondary | ICD-10-CM

## 2014-03-15 NOTE — Patient Instructions (Signed)
Your physician recommends that you schedule a follow-up appointment in: AS NEEDED PENDING TEST RESULTS  Your physician has requested that you have an echocardiogram. Echocardiography is a painless test that uses sound waves to create images of your heart. It provides your doctor with information about the size and shape of your heart and how well your heart's chambers and valves are working. This procedure takes approximately one hour. There are no restrictions for this procedure.   Your physician has requested that you have an exercise tolerance test. For further information please visit [email or web address]. Please also follow instruction sheet, as given.    Exercise Stress Electrocardiogram An exercise stress electrocardiogram is a test to check how blood flows to your heart. It is done to find areas of poor blood flow. You will need to walk on a treadmill for this test. The electrocardiogram will record your heartbeat when you are at rest and when you are exercising. BEFORE THE PROCEDURE  Do not have drinks with caffeine or foods with caffeine for 24 hours before the test, or as told by your doctor. This includes coffee, tea (even decaf tea), sodas, chocolate, and cocoa.  Follow your doctor's instructions about eating and drinking before the test.  Ask your doctor what medicines you should or should not take before the test. Take your medicines with water unless told by your doctor not to.  If you use an inhaler, bring it with you to the test.  Bring a snack to eat after the test.  Do not  smoke for 4 hours before the test.  Do not put lotions, powders, creams, or oils on your chest before the test.  Wear comfortable shoes and clothing. PROCEDURE  You will have patches put on your chest. Small areas of your chest may need to be shaved. Wires will be connected to the patches.  Your heart rate will be watched while you are resting and while you are exercising.  You will walk on the  treadmill. The treadmill will slowly get faster to raise your heart rate.  The test will take about 1-2 hours. AFTER THE PROCEDURE  Your heart rate and blood pressure will be watched after the test.  You may return to your normal diet, activities, and medicines or as told by your doctor. Document Released: 12/31/2007 Document Revised: 11/28/2013 Document Reviewed: 03/21/2013 Exit[employer] Patient Information 2015 [employer]. This information is not intended to replace advice given to you by your health care provider. Make sure you discuss any questions you have with your health care provider.   

## 2014-03-15 NOTE — Progress Notes (Signed)
HPI: 48 year old female for evaluation of chest pain. Seen in the emergency room in July with chest pain. Cardiac markers, d-dimer, hemoglobin and renal function normal. Chest x-ray unremarkable. The patient had a left lumpectomy last fall. Beginning this spring she has had occasional chest pain. It can be substernal or in the left breast area. It occurs both with exertion and at rest. It typically lasts 15-20 seconds and resolves. Not pleuritic or positional. Not related to food. No associated symptoms. When she was seen in the emergency room her pain began when she reached for an object. She developed pain which lasted greater than 24 hours. She was seen in the emergency room as above with negative evaluation. Her pain is described as stabbing and pressure.  Current Outpatient Prescriptions  Medication Sig Dispense Refill  . aspirin-acetaminophen-caffeine (EXCEDRIN MIGRAINE) 250-250-65 MG per tablet Take by mouth every 6 (six) hours as needed for headache.      . Calcium Carbonate-Vitamin D (CALCIUM + D PO) Take by mouth.      . Cholecalciferol (VITAMIN D) 2000 UNITS tablet Take 2,000 Units by mouth daily.      . cholestyramine (QUESTRAN) 4 GM/DOSE powder Take by mouth 3 (three) times daily with meals.      . hyoscyamine (ANASPAZ) 0.125 MG TBDP disintergrating tablet Place 0.125 mg under the tongue every 4 (four) hours as needed for cramping.      Marland Kitchen KRILL OIL OMEGA-3 PO Take by mouth.      . levothyroxine (SYNTHROID, LEVOTHROID) 112 MCG tablet Take 112 mcg by mouth daily before breakfast.      . Multiple Vitamin (MULTIVITAMIN) tablet Take 1 tablet by mouth daily.      . tamoxifen (NOLVADEX) 20 MG tablet Take 20 mg by mouth daily.       No current facility-administered medications for this visit.    Allergies  Allergen Reactions  . Penicillins Rash    Past Medical History  Diagnosis Date  . Hyperlipidemia   . History of anemia   . Hypothyroidism   . Chronic diarrhea   . Cancer       left breast  . Complication of anesthesia     woke up agitated after gum surgery    Past Surgical History  Procedure Laterality Date  . Gum surgery    . Breast lumpectomy with needle localization Left 06/20/2013    Procedure: BREAST LUMPECTOMY WITH NEEDLE LOCALIZATION;  Surgeon: Merrie Roof, MD;  Location: [facility];  Service: General;  Laterality: Left;    History   Social History  . Marital Status: Married    Spouse Name: [name]    Number of Children: 0  . Years of Education: N/A   Occupational History  .     Social History Main Topics  . Smoking status: Never Smoker   . Smokeless tobacco: Never Used  . Alcohol Use: No  . Drug Use: No  . Sexual Activity: Yes   Other Topics Concern  . Not on file   Social History Narrative  . No narrative on file    Family History  Problem Relation Age of Onset  . Breast cancer Mother 30  . Melanoma Father 65  . Breast cancer Sister 62    ER+/her2-; onco score = 8  . Cancer Maternal Aunt     oral cancer; smoker  . Head & neck cancer Paternal Uncle     smoker  . Breast cancer Cousin  dx in her late 53s-60s  . Heart disease Mother     Atrial fibrillation    ROS: no fevers or chills, productive cough, hemoptysis, dysphasia, odynophagia, melena, hematochezia, dysuria, hematuria, rash, seizure activity, orthopnea, PND, pedal edema, claudication. Remaining systems are negative.  Physical Exam:   Blood pressure 122/78, pulse 88, height 5' 7.5" (1.715 m), weight 214 lb 12.8 oz (97.433 kg), SpO2 98.00%.  General:  Well developed/well nourished in NAD Skin warm/dry Patient not depressed No peripheral clubbing Back-normal HEENT-normal/normal eyelids Neck supple/normal carotid upstroke bilaterally; no bruits; no JVD; no thyromegaly chest - CTA/ normal expansion CV - RRR/normal S1 and S2; no murmurs, rubs or gallops;  PMI nondisplaced Abdomen -NT/ND, no HSM, no mass, + bowel sounds, no bruit 2+ femoral  pulses, no bruits Ext-no edema, chords, 2+ DP Neuro-grossly nonfocal  ECG 02/10/2014-sinus rhythm with no ST changes.

## 2014-03-15 NOTE — Assessment & Plan Note (Signed)
Management per oncology. 

## 2014-03-15 NOTE — Assessment & Plan Note (Signed)
Symptoms are atypical. Previous enzymes and d-dimer negative. Chest x-ray negative. Electrocardiogram without significant changes. Plan to proceed with exercise treadmill and echocardiogram. If normal we will not pursue further ischemia evaluation. Patient feels as though her symptoms may be related to stress. She is caring for her father who has Alzheimer's and has stressful employment.

## 2014-03-17 ENCOUNTER — Telehealth (HOSPITAL_COMMUNITY): Payer: Self-pay

## 2014-03-17 NOTE — Telephone Encounter (Signed)
Encounter complete. 

## 2014-03-21 ENCOUNTER — Ambulatory Visit (HOSPITAL_COMMUNITY)
Admission: RE | Admit: 2014-03-21 | Discharge: 2014-03-21 | Disposition: A | Discharge status: Home / Self Care | Payer: BC Managed Care – PPO | Source: Ambulatory Visit | Attending: Cardiology | Admitting: Cardiology

## 2014-03-21 DIAGNOSIS — R072 Precordial pain: Secondary | ICD-10-CM | POA: Insufficient documentation

## 2014-03-21 DIAGNOSIS — R079 Chest pain, unspecified: Secondary | ICD-10-CM

## 2014-03-21 DIAGNOSIS — I379 Nonrheumatic pulmonary valve disorder, unspecified: Secondary | ICD-10-CM

## 2014-03-21 NOTE — Progress Notes (Signed)
2D Echo Performed 03/21/2014    Marygrace Drought, RCS

## 2014-03-21 NOTE — Procedures (Signed)
Exercise Treadmill Test  Test  Exercise Tolerance Test Ordering MD: Kirk Ruths, MD  Interpreting MD:   Unique Test No: 1  Treadmill:  1  Indication for ETT: chest pain - rule out ischemia  Contraindication to ETT: No   Stress Modality: exercise - treadmill  Cardiac Imaging Performed: non   Protocol: standard Bruce - maximal  Max BP:  153/80  Max MPHR (bpm):  173 85% MPR (bpm):  147  MPHR obtained (bpm):  181 % MPHR obtained: 104  Reached 85% MPHR (min:sec):  5:55 Total Exercise Time (min-sec):  9:23  Workload in METS:  10.70 Borg Scale:   Reason ETT Terminated:  fatigue    ST Segment Analysis At Rest: NSR with no ST changes; low voltage. With Exercise: no evidence of significant ST depression  Other Information Arrhythmia:  No Angina during ETT:  absent (0) Quality of ETT:  diagnostic  ETT Interpretation:  normal - no evidence of ischemia by ST analysis  Comments: Exercise treadmill with good exercise tolerance; no chest pain; normal BP response; no ST changes; normal ETT. Kirk Ruths

## 2014-04-06 ENCOUNTER — Ambulatory Visit (INDEPENDENT_AMBULATORY_CARE_PROVIDER_SITE_OTHER): Payer: BC Managed Care – PPO | Admitting: General Surgery

## 2014-04-19 ENCOUNTER — Ambulatory Visit
Admission: RE | Admit: 2014-04-19 | Discharge: 2014-04-19 | Disposition: A | Discharge status: Home / Self Care | Payer: BC Managed Care – PPO | Source: Ambulatory Visit | Attending: Adult Health | Admitting: Adult Health

## 2014-04-19 ENCOUNTER — Other Ambulatory Visit: Payer: Self-pay | Admitting: Adult Health

## 2014-04-19 DIAGNOSIS — D0500 Lobular carcinoma in situ of unspecified breast: Secondary | ICD-10-CM

## 2014-04-28 ENCOUNTER — Ambulatory Visit
Admission: RE | Admit: 2014-04-28 | Discharge: 2014-04-28 | Disposition: A | Discharge status: Home / Self Care | Payer: BC Managed Care – PPO | Source: Ambulatory Visit | Attending: Adult Health | Admitting: Adult Health

## 2014-04-28 DIAGNOSIS — D0502 Lobular carcinoma in situ of left breast: Secondary | ICD-10-CM

## 2014-04-28 MED ORDER — GADOBENATE DIMEGLUMINE 529 MG/ML IV SOLN
20.0000 mL | Freq: Once | INTRAVENOUS | Status: AC | PRN
Start: 2014-04-28 — End: 2014-04-28
  Administered 2014-04-28: 20 mL via INTRAVENOUS

## 2014-05-02 ENCOUNTER — Other Ambulatory Visit: Payer: Self-pay | Admitting: Obstetrics and Gynecology

## 2014-05-03 ENCOUNTER — Telehealth: Payer: Self-pay | Admitting: *Deleted

## 2014-05-03 LAB — CYTOLOGY - PAP

## 2014-05-03 NOTE — Telephone Encounter (Signed)
As noted below, called to inform pt of test results which showed no metastatic disease or abnormal masses. Pt verbalized understanding and was pleased with results. Message to be forwarded to Charlestine Massed, NP.

## 2014-05-03 NOTE — Telephone Encounter (Signed)
Message copied by Harmon Pier on Wed May 03, 2014  1:20 PM ------      Message from: Minette Headland      Created: Mon May 01, 2014 12:59 PM       Please call patient with results.              Thanks!            Ursina       ----- Message -----         From: Rad Results In Interface         Sent: 04/28/2014   4:05 PM           To: Minette Headland, NP                   ------

## 2014-05-12 ENCOUNTER — Other Ambulatory Visit: Payer: Self-pay

## 2014-06-08 ENCOUNTER — Telehealth: Payer: Self-pay | Admitting: Adult Health

## 2014-06-08 NOTE — Telephone Encounter (Signed)
, °

## 2014-06-09 ENCOUNTER — Other Ambulatory Visit: Payer: Self-pay | Admitting: *Deleted

## 2014-06-09 DIAGNOSIS — D0502 Lobular carcinoma in situ of left breast: Secondary | ICD-10-CM

## 2014-06-12 ENCOUNTER — Other Ambulatory Visit (HOSPITAL_BASED_OUTPATIENT_CLINIC_OR_DEPARTMENT_OTHER): Payer: BC Managed Care – PPO

## 2014-06-12 ENCOUNTER — Other Ambulatory Visit: Payer: BC Managed Care – PPO

## 2014-06-12 ENCOUNTER — Ambulatory Visit: Payer: BC Managed Care – PPO | Admitting: Adult Health

## 2014-06-12 ENCOUNTER — Telehealth: Payer: Self-pay | Admitting: Hematology

## 2014-06-12 ENCOUNTER — Encounter: Payer: Self-pay | Admitting: Hematology

## 2014-06-12 ENCOUNTER — Ambulatory Visit (HOSPITAL_BASED_OUTPATIENT_CLINIC_OR_DEPARTMENT_OTHER): Payer: BC Managed Care – PPO | Admitting: Hematology

## 2014-06-12 VITALS — BP 113/70 | HR 88 | Temp 99.8°F | Resp 19 | Ht 67.5 in | Wt 216.7 lb

## 2014-06-12 DIAGNOSIS — D0502 Lobular carcinoma in situ of left breast: Secondary | ICD-10-CM

## 2014-06-12 DIAGNOSIS — Z803 Family history of malignant neoplasm of breast: Secondary | ICD-10-CM

## 2014-06-12 DIAGNOSIS — Z170 Estrogen receptor positive status [ER+]: Secondary | ICD-10-CM

## 2014-06-12 LAB — COMPREHENSIVE METABOLIC PANEL (CC13)
ALBUMIN: 3.9 g/dL (ref 3.5–5.0)
ALT: 15 U/L (ref 0–55)
AST: 17 U/L (ref 5–34)
Alkaline Phosphatase: 49 U/L (ref 40–150)
Anion Gap: 9 mEq/L (ref 3–11)
BUN: 10.2 mg/dL (ref 7.0–26.0)
CO2: 23 meq/L (ref 22–29)
Calcium: 9.1 mg/dL (ref 8.4–10.4)
Chloride: 107 mEq/L (ref 98–109)
Creatinine: 0.8 mg/dL (ref 0.6–1.1)
GLUCOSE: 64 mg/dL — AB (ref 70–140)
POTASSIUM: 3.9 meq/L (ref 3.5–5.1)
SODIUM: 138 meq/L (ref 136–145)
TOTAL PROTEIN: 7.1 g/dL (ref 6.4–8.3)
Total Bilirubin: 0.26 mg/dL (ref 0.20–1.20)

## 2014-06-12 LAB — CBC WITH DIFFERENTIAL/PLATELET
BASO%: 1.8 % (ref 0.0–2.0)
BASOS ABS: 0.1 10*3/uL (ref 0.0–0.1)
EOS ABS: 0.1 10*3/uL (ref 0.0–0.5)
EOS%: 2 % (ref 0.0–7.0)
HCT: 41.3 % (ref 34.8–46.6)
HEMOGLOBIN: 13.5 g/dL (ref 11.6–15.9)
LYMPH%: 38.3 % (ref 14.0–49.7)
MCH: 29.7 pg (ref 25.1–34.0)
MCHC: 32.8 g/dL (ref 31.5–36.0)
MCV: 90.5 fL (ref 79.5–101.0)
MONO#: 0.6 10*3/uL (ref 0.1–0.9)
MONO%: 11.8 % (ref 0.0–14.0)
NEUT%: 46.1 % (ref 38.4–76.8)
NEUTROS ABS: 2.4 10*3/uL (ref 1.5–6.5)
Platelets: 249 10*3/uL (ref 145–400)
RBC: 4.56 10*6/uL (ref 3.70–5.45)
RDW: 12.5 % (ref 11.2–14.5)
WBC: 5.2 10*3/uL (ref 3.9–10.3)
lymph#: 2 10*3/uL (ref 0.9–3.3)

## 2014-06-12 NOTE — Progress Notes (Signed)
ID: [name] OB: July 21, 1966  MR#: [id number]  CSN#:[id number]  PCP: Stephanie Chamber, MD GYN:   SU: [name] OTHER MD:  CHIEF COMPLAINT: H/o LCIS here for f/u.    BREAST CANCER HISTORY: Patient underwent a screening mammogram on 04/18/2013 which did not show anything. However she had MRI study of both breasts because of her strong family history. She was found to have 2 abnormalities in the 12:00 position of the left breast. Both of these were biopsied. 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 Patient has had a long history of occasional pain in both breasts. She has no other issues related to that breasts. Patient does have a sister who had breast cancer at the age of 8. She also had mother who had breast cancer at the age of 58.   CURRENT THERAPY:  Tamoxifen $RemoveBe'20mg'wcwpNawSI$  daily   INTERVAL HISTORY: Patient is here today for f/u of her LCIS. She has mestrual period once a month, but does have vaginal spotting for the past 6 months. She saw her GYN doctor last month and PAP was normal. She has mild hot flush, manageable. She otherwise feels well. She did have episodes of left chest pain, nonexertional, she was evaluated by a cardiologist and tests were negative. Her synthroid dose was recently increased based on her lab result. She also started using Ambin for insomnia.    REVIEW OF SYSTEMS:A 10 point review of systems was conducted and is otherwise negative except for what is noted above.     PAST MEDICAL HISTORY: Past Medical History  Diagnosis Date  . Hyperlipidemia   . History of anemia   . Hypothyroidism   . Chronic diarrhea   . Cancer     left breast  . Complication of anesthesia     woke up agitated after gum surgery    PAST SURGICAL HISTORY: Past Surgical History  Procedure Laterality Date  . Gum surgery    . Breast lumpectomy with needle localization Left 06/20/2013    Procedure: BREAST LUMPECTOMY WITH  NEEDLE LOCALIZATION;  Surgeon: Stephanie Roof, MD;  Location: [facility];  Service: General;  Laterality: Left;    FAMILY HISTORY Family History  Problem Relation Age of Onset  . Breast cancer Mother 93  . Melanoma Father 41  . Breast cancer Sister 24    ER+/her2-; onco score = 8  . Cancer Maternal Aunt     oral cancer; smoker  . Head & neck cancer Paternal Uncle     smoker  . Breast cancer Cousin     dx in her late 87s-60s  . Heart disease Mother     Atrial fibrillation    GYNECOLOGIC HISTORY: menarche at age 83, G54 P0, no h/o sexually transmitted infections, hormone replacement therapy, or abnormal pap smears.  SOCIAL HISTORY: Patient is married to husband [name] of 60 years, and they live in a 2 story house in [place], [place].  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    ADVANCED DIRECTIVES: Not in place.    HEALTH MAINTENANCE: History  Substance Use Topics  . Smoking status: Never Smoker   . Smokeless tobacco: Never Used  . Alcohol Use: No   Mammogram:  03/2014 Breast MRI: 04/2014 Colonoscopy: 2-3 years ago, unsure of f/u Bone Density Scan: n/a Pap Smear: 04/2014    Allergies  Allergen Reactions  .  Penicillins Rash    Current Outpatient Prescriptions  Medication Sig Dispense Refill  . aspirin-acetaminophen-caffeine (EXCEDRIN MIGRAINE) 250-250-65 MG per tablet Take by mouth every 6 (six) hours as needed for headache.    . Calcium Carbonate-Vitamin D (CALCIUM + D PO) Take by mouth.    . Cholecalciferol (VITAMIN D) 2000 UNITS tablet Take 2,000 Units by mouth daily.    . cholestyramine (QUESTRAN) 4 GM/DOSE powder Take by mouth 3 (three) times daily with meals.    . hyoscyamine (ANASPAZ) 0.125 MG TBDP disintergrating tablet Place 0.125 mg under the tongue every 4 (four) hours as needed for cramping.    Marland Kitchen KRILL OIL OMEGA-3 PO Take by mouth.    . Multiple  Vitamin (MULTIVITAMIN) tablet Take 1 tablet by mouth daily.    . tamoxifen (NOLVADEX) 20 MG tablet Take 20 mg by mouth daily.    Marland Kitchen levothyroxine (SYNTHROID, LEVOTHROID) 112 MCG tablet Take 112 mcg by mouth daily before breakfast.    . SYNTHROID 25 MCG tablet Take 12.5 mcg by mouth daily. Total dose @@ 144mcg  2  . zolpidem (AMBIEN CR) 12.5 MG CR tablet Take 12.5 mg by mouth daily.  4   No current facility-administered medications for this visit.    OBJECTIVE: Filed Vitals:   06/12/14 0844  BP: 113/70  Pulse: 88  Temp: 99.8 F (37.7 C)  Resp: 19     Body mass index is 33.42 kg/(m^2).     GENERAL: Patient is a well appearing female in no acute distress HEENT:  Sclerae anicteric.  Oropharynx clear and moist. No ulcerations or evidence of oropharyngeal candidiasis. Neck is supple.  NODES:  No cervical, supraclavicular, or axillary lymphadenopathy palpated.  BREAST EXAM:  No skin change, no nipple discharge, no palpable mass LUNGS:  Clear to auscultation bilaterally.  No wheezes or rhonchi. HEART:  Regular rate and rhythm. No murmur appreciated. ABDOMEN:  Soft, nontender.  Positive, normoactive bowel sounds. No organomegaly palpated. MSK:  No focal spinal tenderness to palpation. Full range of motion bilaterally in the upper extremities. EXTREMITIES:  No peripheral edema.   SKIN:  Clear with no obvious rashes or skin changes. No nail dyscrasia. NEURO:  Nonfocal. Well oriented.  Appropriate affect. ECOG FS:1 - Symptomatic but completely ambulatory  LAB RESULTS:  CMP     Component Value Date/Time   NA 138 06/12/2014 0831   NA 138 02/10/2014 1449    I No results found for: SPEP  Lab Results  Component Value Date   WBC 5.2 06/12/2014   NEUTROABS 2.4 06/12/2014   HGB 13.5 06/12/2014   HCT 41.3 06/12/2014   MCV 90.5 06/12/2014   PLT 249 06/12/2014      Chemistry      Component Value Date/Time   NA 138 06/12/2014 0831   NA 138 02/10/2014 1449      Component Value  Date/Time   CALCIUM 9.1 06/12/2014 0831   CALCIUM 9.6 02/10/2014 1449       Urinalysis No results found for: COLORURINE  RADIOLOGY:  Breast MRI 04/28/14 No evidence of residual carcinoma in situ or of new malignancy. Abnormal left breast enhancement seen on the prior MRI has resolved.  Diagnostic Mammogram and [place] 04/19/14 1. No mammographic or ultrasound evidence for malignancy. 2. High risk of breast cancer given the patient's strong family history and prior biopsy showing LCIS. 3. Based on the Tyrer-Cuzick risk assessment model, the patient has a 65%% lifetime risk of developing breast cancer. By the recommendations of the American  Cancer Society, annual MRI and annual mammography are recommended.   ASSESSMENT: 48 y.o. with h/o left breast LCIS, strong family history of breast cancer, genetic test (-)  1. Patient underwent lumpectomy by [name] on 06/20/14 that demonstrated LCIS  2.  Patient was started on Tamoxifen 22m daily in January, 2015.    PLAN: est She is doing well clinically, recent screening breast mammogram, [place] were all negative, will continue Tamoxifen.  We discussed the risk of endometrial cancer and DVT from Tamoxifen. She knows to call her GYN if her vaginal spotting gets worse.  We discussed healthy diet and exercise as risk reduction.   Due to her previous mammogram not detecting her breast abnormality, she will need a breast mammogram and MRI in one year.  She is in agreement with this.    I will see her back in 6 months for follow up and exam.     She knows to call u[place]ain the interim for any questions or concerns.  We can certainly see her sooner if needed.  I spent 25 minutes counseling the patient face to face.  The total time spent in the appointment was 30 minutes.  FTruitt Merle MD 06/12/2014

## 2014-06-12 NOTE — Telephone Encounter (Signed)
s.w. pt and advised on May 2016 appt.....pt ok and aware °

## 2014-09-20 ENCOUNTER — Other Ambulatory Visit: Payer: Self-pay | Admitting: General Surgery

## 2014-11-10 ENCOUNTER — Other Ambulatory Visit: Payer: Self-pay | Admitting: Oncology

## 2014-11-14 ENCOUNTER — Other Ambulatory Visit: Payer: Self-pay | Admitting: *Deleted

## 2014-11-14 DIAGNOSIS — D0502 Lobular carcinoma in situ of left breast: Secondary | ICD-10-CM

## 2014-11-14 MED ORDER — TAMOXIFEN CITRATE 20 MG PO TABS: 20.0000 mg | ORAL_TABLET | Freq: Every day | ORAL | Status: DC

## 2014-11-27 ENCOUNTER — Telehealth: Payer: Self-pay | Admitting: Hematology & Oncology

## 2014-11-27 NOTE — Telephone Encounter (Signed)
Left pt message to call for appointment °

## 2014-12-08 ENCOUNTER — Other Ambulatory Visit: Payer: Self-pay | Admitting: *Deleted

## 2014-12-08 DIAGNOSIS — D0502 Lobular carcinoma in situ of left breast: Secondary | ICD-10-CM

## 2014-12-11 ENCOUNTER — Ambulatory Visit (HOSPITAL_BASED_OUTPATIENT_CLINIC_OR_DEPARTMENT_OTHER): Payer: BC Managed Care – PPO | Admitting: Hematology & Oncology

## 2014-12-11 ENCOUNTER — Ambulatory Visit: Payer: BC Managed Care – PPO | Admitting: Hematology

## 2014-12-11 ENCOUNTER — Encounter: Payer: Self-pay | Admitting: Hematology & Oncology

## 2014-12-11 ENCOUNTER — Other Ambulatory Visit (HOSPITAL_BASED_OUTPATIENT_CLINIC_OR_DEPARTMENT_OTHER): Payer: BC Managed Care – PPO

## 2014-12-11 VITALS — BP 116/68 | HR 78 | Temp 98.6°F | Resp 14 | Ht 67.0 in | Wt 211.0 lb

## 2014-12-11 DIAGNOSIS — M810 Age-related osteoporosis without current pathological fracture: Secondary | ICD-10-CM

## 2014-12-11 DIAGNOSIS — D0502 Lobular carcinoma in situ of left breast: Secondary | ICD-10-CM

## 2014-12-11 LAB — CMP (CANCER CENTER ONLY)
ALK PHOS: 48 U/L (ref 26–84)
ALT: 17 U/L (ref 10–47)
AST: 21 U/L (ref 11–38)
Albumin: 3.7 g/dL (ref 3.3–5.5)
BUN, Bld: 7 mg/dL (ref 7–22)
CHLORIDE: 102 meq/L (ref 98–108)
CO2: 30 meq/L (ref 18–33)
CREATININE: 0.7 mg/dL (ref 0.6–1.2)
Calcium: 9.3 mg/dL (ref 8.0–10.3)
Glucose, Bld: 102 mg/dL (ref 73–118)
Potassium: 3.9 mEq/L (ref 3.3–4.7)
Sodium: 138 mEq/L (ref 128–145)
Total Bilirubin: 0.5 mg/dl (ref 0.20–1.60)
Total Protein: 7.2 g/dL (ref 6.4–8.1)

## 2014-12-11 LAB — CBC WITH DIFFERENTIAL (CANCER CENTER ONLY)
BASO#: 0 10e3/uL (ref 0.0–0.2)
BASO%: 0.3 % (ref 0.0–2.0)
EOS%: 1.2 % (ref 0.0–7.0)
Eosinophils Absolute: 0.1 10e3/uL (ref 0.0–0.5)
HCT: 39.1 % (ref 34.8–46.6)
HGB: 13.3 g/dL (ref 11.6–15.9)
LYMPH#: 2.6 10e3/uL (ref 0.9–3.3)
LYMPH%: 42.2 % (ref 14.0–48.0)
MCH: 31.1 pg (ref 26.0–34.0)
MCHC: 34 g/dL (ref 32.0–36.0)
MCV: 91 fL (ref 81–101)
MONO#: 0.6 10e3/uL (ref 0.1–0.9)
MONO%: 9.1 % (ref 0.0–13.0)
NEUT#: 2.9 10e3/uL (ref 1.5–6.5)
NEUT%: 47.2 % (ref 39.6–80.0)
Platelets: 277 10e3/uL (ref 145–400)
RBC: 4.28 10e6/uL (ref 3.70–5.32)
RDW: 12.4 % (ref 11.1–15.7)
WBC: 6 10e3/uL (ref 3.9–10.0)

## 2014-12-11 NOTE — Progress Notes (Signed)
Hematology and Oncology Follow Up Visit  [name] [id number] Nov 30, 1965 49 y.o. 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 She was seen at the main [facility] by Dr. Burr Medico. [name] enjoyed seeing Stephanie a lot. However, she lives very close to our office. Because of issues with Stephanie father who has Alzheimer's disease, is a lot easier for Stephanie to be seen in the office.  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 This MRI was done back in October 2014. Patient then was seen by surgery. She had a biopsy. The path report ([id number]) showed lobular carcinoma in situ. Patient then underwent a formal lobectomy. This is a November 2014. The path report ([id number]) lobular carcinoma in situ.  There is a strong family history of breast cancer. [name] and mother had breast cancer. [name] tested negative for the BRCA gene.  She is placed on tamoxifen given that she is high risk.  She's doing well. She had follow-up MRI in October 2015. There is no abnormalities noted on the left breast. P rashes working. She is helping take care of Stephanie father also. Again, he has Alzheimer's disease.  She is having some slight discomfort in the left lower quadrant around abdomen. She is on high-dose amine. I spoke she may have irritable bowel.  She's had no bleeding. She's had no fever. She had no nausea or vomiting.  There is no arm or leg swelling. She has no rashes. She has no weight loss weight gain. There's no cough or shortness of breath.  She does not smoke.  Stephanie overall performance status is ECOG 0. Medications:  Current outpatient prescriptions:  .  aspirin-acetaminophen-caffeine (EXCEDRIN MIGRAINE) 250-250-65 MG per tablet, Take by mouth every 6 (six) hours as needed for headache., Disp: , Rfl:   .  Calcium Carbonate-Vitamin D (CALCIUM + D PO), Take by mouth., Disp: , Rfl:  .  Cholecalciferol (VITAMIN D) 2000 UNITS tablet, Take 2,000 Units by mouth daily., Disp: , Rfl:  .  cholestyramine (QUESTRAN) 4 GM/DOSE powder, 4 g as needed. , Disp: , Rfl:  .  hyoscyamine (ANASPAZ) 0.125 MG TBDP disintergrating tablet, Place 0.125 mg under the tongue every 4 (four) hours as needed for cramping., Disp: , Rfl:  .  KRILL OIL OMEGA-3 PO, Take by mouth., Disp: , Rfl:  .  levothyroxine (SYNTHROID, LEVOTHROID) 112 MCG tablet, Take 112 mcg by mouth daily before breakfast., Disp: , Rfl:  .  Multiple Vitamin (MULTIVITAMIN) tablet, Take 1 tablet by mouth daily., Disp: , Rfl:  .  SYNTHROID 25 MCG tablet, Take 125 mcg by mouth daily before breakfast. Total dose @@ 136mg, Disp: , Rfl: 2 .  tamoxifen (NOLVADEX) 20 MG tablet, Take 1 tablet (20 mg total) by mouth daily., Disp: 90 tablet, Rfl: 0  Allergies:  Allergies  Allergen Reactions  . Penicillins Rash    Past Medical History, Surgical history, Social history, and Family History were reviewed and updated.  Review of Systems: As above  Physical Exam:  height is _0  (1.702 m) and weight is 211 lb (95.709 kg). Stephanie oral temperature is 98.6 F (37 C). Stephanie blood pressure is 116/68 and Stephanie pulse is 78. Stephanie respiration is 14.   Wt Readings from Last 3  Encounters:  12/11/14 211 lb (95.709 kg)  06/12/14 216 lb 11.2 oz (98.294 kg)  03/15/14 214 lb 12.8 oz (97.433 kg)     Well-developed well-nourished white female in no obvious distress. Head and neck exam shows no ocular or oral lesions. There are no palpable cervical or supraclavicular lymph nodes. Lungs are clear. Cardiac exam regular rate and rhythm with no murmurs, rubs or bruits. Abdomen is soft. She has good bowel sounds. There is no fluid wave. There may be some slight tenderness in the left lower quadrant to palpation. No fluid wave is noted. There is no palpable liver or spleen tip. Breast exam  shows right breast with no masses, edema or erythema. There is no right axillary adenopathy. Left breast shows a well-healed lumpectomy at the 1:00 position. She has no firmness at the lumpectomy site. She has no distinct mass in the left breast. There is no left axillary adenopathy. Extremities shows no clubbing, cyanosis or edema. She has good range of motion of his joints. Back exam shows no tenderness over the spine, ribs or hips. Skin exam shows no rashes, ecchymoses or petechia. Neurological exam shows no focal neurological deficits.  Lab Results  Component Value Date   WBC 6.0 12/11/2014   HGB 13.3 12/11/2014   HCT 39.1 12/11/2014   MCV 91 12/11/2014   PLT 277 12/11/2014     Chemistry      Component Value Date/Time   NA 138 12/11/2014 1318   NA 138 06/12/2014 0831   NA 138 02/10/2014 1449   K 3.9 12/11/2014 1318   K 3.9 06/12/2014 0831   K 3.7 02/10/2014 1449   CL 102 12/11/2014 1318   CL 102 02/10/2014 1449   CO2 30 12/11/2014 1318   CO2 23 06/12/2014 0831   CO2 24 02/10/2014 1449   BUN 7 12/11/2014 1318   BUN 10.2 06/12/2014 0831   BUN 8 02/10/2014 1449   CREATININE 0.7 12/11/2014 1318   CREATININE 0.8 06/12/2014 0831   CREATININE 0.80 02/10/2014 1449      Component Value Date/Time   CALCIUM 9.3 12/11/2014 1318   CALCIUM 9.1 06/12/2014 0831   CALCIUM 9.6 02/10/2014 1449   ALKPHOS 48 12/11/2014 1318   ALKPHOS 49 06/12/2014 0831   AST 21 12/11/2014 1318   AST 17 06/12/2014 0831   ALT 17 12/11/2014 1318   ALT 15 06/12/2014 0831   BILITOT 0.50 12/11/2014 1318   BILITOT 0.26 06/12/2014 0831         Impression and Plan: [name] is 70 year old white with lobular carcinoma in situ. She understands that this is not breast cancer but a marker for breast cancer. There is a risk of breast cancer given Stephanie family history, despite the negative BRCA test with [name].  I think that she is still perimenopausal. Stephanie cycles are a little erratic.  I would think that  when she becomes postmenopausal, she may be a good candidate for Aromasin.  I think that getting Stephanie back in 6 months would be appropriate. I suppose she probably will have a mammogram before we see Stephanie.  I will leave a message for Stephanie gynecologist to see if he can check Stephanie for menopausal status when he sees Stephanie September.  I spent about 45 minutes with Stephanie today. It was nice talking to Stephanie. I gave Stephanie a prayer blanket which she appreciated very much.   [name], MD 5/16/20162:30 PM

## 2015-01-11 ENCOUNTER — Encounter: Payer: Self-pay | Admitting: *Deleted

## 2015-02-02 ENCOUNTER — Other Ambulatory Visit: Payer: Self-pay

## 2015-02-02 DIAGNOSIS — D0502 Lobular carcinoma in situ of left breast: Secondary | ICD-10-CM

## 2015-02-02 MED ORDER — TAMOXIFEN CITRATE 20 MG PO TABS: 20.0000 mg | ORAL_TABLET | Freq: Every day | ORAL | Status: DC

## 2015-05-21 ENCOUNTER — Other Ambulatory Visit: Payer: Self-pay | Admitting: Obstetrics and Gynecology

## 2015-05-21 DIAGNOSIS — Z853 Personal history of malignant neoplasm of breast: Secondary | ICD-10-CM

## 2015-06-01 ENCOUNTER — Other Ambulatory Visit: Payer: Self-pay | Admitting: Obstetrics and Gynecology

## 2015-06-01 ENCOUNTER — Ambulatory Visit
Admission: RE | Admit: 2015-06-01 | Discharge: 2015-06-01 | Disposition: A | Discharge status: Home / Self Care | Payer: BC Managed Care – PPO | Source: Ambulatory Visit | Attending: Obstetrics and Gynecology | Admitting: Obstetrics and Gynecology

## 2015-06-01 DIAGNOSIS — Z853 Personal history of malignant neoplasm of breast: Secondary | ICD-10-CM

## 2015-06-01 DIAGNOSIS — Z1231 Encounter for screening mammogram for malignant neoplasm of breast: Secondary | ICD-10-CM

## 2015-06-13 ENCOUNTER — Other Ambulatory Visit (HOSPITAL_BASED_OUTPATIENT_CLINIC_OR_DEPARTMENT_OTHER): Payer: BC Managed Care – PPO

## 2015-06-13 ENCOUNTER — Ambulatory Visit (HOSPITAL_BASED_OUTPATIENT_CLINIC_OR_DEPARTMENT_OTHER): Payer: BC Managed Care – PPO | Admitting: Family

## 2015-06-13 ENCOUNTER — Encounter: Payer: Self-pay | Admitting: Family

## 2015-06-13 VITALS — BP 114/72 | HR 77 | Temp 98.5°F | Resp 16 | Ht 67.0 in | Wt 185.0 lb

## 2015-06-13 DIAGNOSIS — D0502 Lobular carcinoma in situ of left breast: Secondary | ICD-10-CM

## 2015-06-13 DIAGNOSIS — M810 Age-related osteoporosis without current pathological fracture: Secondary | ICD-10-CM

## 2015-06-13 LAB — CBC WITH DIFFERENTIAL (CANCER CENTER ONLY)
BASO#: 0 10*3/uL (ref 0.0–0.2)
BASO%: 0.4 % (ref 0.0–2.0)
EOS ABS: 0.2 10*3/uL (ref 0.0–0.5)
EOS%: 2.7 % (ref 0.0–7.0)
HCT: 41.2 % (ref 34.8–46.6)
HGB: 13.8 g/dL (ref 11.6–15.9)
LYMPH#: 2.3 10*3/uL (ref 0.9–3.3)
LYMPH%: 40.6 % (ref 14.0–48.0)
MCH: 30 pg (ref 26.0–34.0)
MCHC: 33.5 g/dL (ref 32.0–36.0)
MCV: 90 fL (ref 81–101)
MONO#: 0.5 10*3/uL (ref 0.1–0.9)
MONO%: 8 % (ref 0.0–13.0)
NEUT#: 2.7 10*3/uL (ref 1.5–6.5)
NEUT%: 48.3 % (ref 39.6–80.0)
PLATELETS: 200 10*3/uL (ref 145–400)
RBC: 4.6 10*6/uL (ref 3.70–5.32)
RDW: 12.7 % (ref 11.1–15.7)
WBC: 5.7 10*3/uL (ref 3.9–10.0)

## 2015-06-13 LAB — COMPREHENSIVE METABOLIC PANEL (CC13)
ALT: 15 U/L (ref 0–55)
AST: 20 U/L (ref 5–34)
Albumin: 4.1 g/dL (ref 3.5–5.0)
Alkaline Phosphatase: 53 U/L (ref 40–150)
Anion Gap: 9 mEq/L (ref 3–11)
BUN: 8.9 mg/dL (ref 7.0–26.0)
CO2: 25 meq/L (ref 22–29)
Calcium: 9.1 mg/dL (ref 8.4–10.4)
Chloride: 106 mEq/L (ref 98–109)
Creatinine: 0.7 mg/dL (ref 0.6–1.1)
GLUCOSE: 93 mg/dL (ref 70–140)
POTASSIUM: 3.7 meq/L (ref 3.5–5.1)
SODIUM: 140 meq/L (ref 136–145)
Total Bilirubin: 0.39 mg/dL (ref 0.20–1.20)
Total Protein: 6.7 g/dL (ref 6.4–8.3)

## 2015-06-13 NOTE — Progress Notes (Signed)
Hematology and Oncology Follow Up Visit  [name]:[id number] [birth year] 49 y.o. 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 She states that at a recent visit with her gynecologist she had an [place] which shoed a uterine fibroid and poly. With her being on Tamoxifen they felt that these need to be removed and biopsied. This procedure has been scheduled for December 1st.  Her cycles are still very irregular. She has had 5-6 this year with some mild spotting in between.  Her mammogram on November 4th was negative.  No lymphadenopathy of changes with her chest found on exam.  No fever, chills, n/v, cough, rash, dizziness, headaches, vision changes, SOB, chest pain, palpitations, abdominal pain or changes in bowel or bladder habits.  No swelling, tenderness, numbness or tingling in her extremities.  She has a healthy appetite and is staying well hydrated.  She is still helping her mother care for her father who has Alzheimer's.   Medications:    Medication List       This list is accurate as of: 06/13/15  1:35 PM.  Always use your most recent med list.               aspirin-acetaminophen-caffeine 250-250-65 MG tablet  Commonly known as:  EXCEDRIN MIGRAINE  Take by mouth every 6 (six) hours as needed for headache.     CALCIUM + D PO  Take by mouth.     cholestyramine 4 GM/DOSE powder  Commonly known as:  QUESTRAN  4 g as needed.     hyoscyamine 0.125 MG Tbdp disintergrating tablet  Commonly known as:  ANASPAZ  Place 0.125 mg under the tongue every 4 (four) hours as needed for cramping.     KRILL OIL OMEGA-3 PO  Take by mouth.     multivitamin tablet  Take 1 tablet by mouth daily.     SYNTHROID 100 MCG tablet  Generic drug:  levothyroxine  TK 1 T PO D     tamoxifen 20 MG tablet  Commonly known as:  NOLVADEX   Take 1 tablet (20 mg total) by mouth daily.     Vitamin D 2000 UNITS tablet  Take 2,000 Units by mouth daily.        Allergies:  Allergies  Allergen Reactions  . Penicillins Rash    Past Medical History, Surgical history, Social history, and Family History were reviewed and updated.  Review of Systems: All other 10 point review of systems is negative.   Physical Exam:  vitals were not taken for this visit.  Wt Readings from Last 3 Encounters:  12/11/14 211 lb (95.709 kg)  06/12/14 216 lb 11.2 oz (98.294 kg)  03/15/14 214 lb 12.8 oz (97.433 kg)    Ocular: Sclerae unicteric, pupils equal, round and reactive to light Ear-nose-throat: Oropharynx clear, dentition fair Lymphatic: No cervical or supraclavicular adenopathy Lungs no rales or rhonchi, good excursion bilaterally Heart regular rate and rhythm, no murmur appreciated Abd soft, nontender, positive bowel sounds MSK no focal spinal tenderness, no joint edema Neuro: non-focal, well-oriented, appropriate affect Breasts: No changes with right breast. Surgical changes to left breast from lumpectomy. No mass, lesion, rash of lymphadenopathy found on exam. Mammogram last week was negative.   Lab Results  Component Value Date   WBC 6.0 12/11/2014  HGB 13.3 12/11/2014   HCT 39.1 12/11/2014   MCV 91 12/11/2014   PLT 277 12/11/2014   No results found for: FERRITIN, IRON, TIBC, UIBC, IRONPCTSAT Lab Results  Component Value Date   RBC 4.28 12/11/2014   No results found for: KPAFRELGTCHN, LAMBDASER, KAPLAMBRATIO No results found for: IGGSERUM, IGA, IGMSERUM No results found for: Odetta Pink, SPEI   Chemistry      Component Value Date/Time   NA 138 12/11/2014 1318   NA 138 06/12/2014 0831   NA 138 02/10/2014 1449   K 3.9 12/11/2014 1318   K 3.9 06/12/2014 0831   K 3.7 02/10/2014 1449   CL 102 12/11/2014 1318   CL 102 02/10/2014 1449   CO2 30 12/11/2014 1318     CO2 23 06/12/2014 0831   CO2 24 02/10/2014 1449   BUN 7 12/11/2014 1318   BUN 10.2 06/12/2014 0831   BUN 8 02/10/2014 1449   CREATININE 0.7 12/11/2014 1318   CREATININE 0.8 06/12/2014 0831   CREATININE 0.80 02/10/2014 1449      Component Value Date/Time   CALCIUM 9.3 12/11/2014 1318   CALCIUM 9.1 06/12/2014 0831   CALCIUM 9.6 02/10/2014 1449   ALKPHOS 48 12/11/2014 1318   ALKPHOS 49 06/12/2014 0831   AST 21 12/11/2014 1318   AST 17 06/12/2014 0831   ALT 17 12/11/2014 1318   ALT 15 06/12/2014 0831   BILITOT 0.50 12/11/2014 1318   BILITOT 0.26 06/12/2014 0831     Impression and Plan: [name] is a pleasant 49 yo white with history of lobular carcinoma in situ. She also has a significant family history of breast cancer with her mother and sister. She is doing well and so far there has been no evidence of recurrence.  He mammogram earlier this month showed no evidence of malignancy.  She is still having irregular cycles. She had an [place] with her last visit with her gynecology which showed a uterine fibroid and polyp. She is having these removed and biopsied on December 1st. We will await these results from her gynecologists office.  She will continue on Tamoxifen 20 mg daily.  We will plan to see her back in 6 months for labs and follow-up.  She will contact us with any questions or concerns. We can certainly see her sooner if need be.   Eliezer Bottom, NP 11/16/20161:35 PM

## 2015-06-14 LAB — VITAMIN D 25 HYDROXY (VIT D DEFICIENCY, FRACTURES): Vit D, 25-Hydroxy: 40 ng/mL (ref 30–100)

## 2015-06-15 ENCOUNTER — Other Ambulatory Visit: Payer: Self-pay | Admitting: Obstetrics and Gynecology

## 2015-06-15 DIAGNOSIS — C50919 Malignant neoplasm of unspecified site of unspecified female breast: Secondary | ICD-10-CM

## 2015-06-26 ENCOUNTER — Ambulatory Visit
Admission: RE | Admit: 2015-06-26 | Discharge: 2015-06-26 | Disposition: A | Discharge status: Home / Self Care | Payer: BC Managed Care – PPO | Source: Ambulatory Visit | Attending: Obstetrics and Gynecology | Admitting: Obstetrics and Gynecology

## 2015-06-26 DIAGNOSIS — C50919 Malignant neoplasm of unspecified site of unspecified female breast: Secondary | ICD-10-CM

## 2015-06-26 MED ORDER — GADOBENATE DIMEGLUMINE 529 MG/ML IV SOLN: 17.0000 mL | Freq: Once | INTRAVENOUS | Status: DC | PRN

## 2015-07-27 ENCOUNTER — Telehealth: Payer: Self-pay | Admitting: *Deleted

## 2015-07-27 NOTE — Telephone Encounter (Signed)
Patient would like Dr Marin Olp to review most recent labs from her OBGYN office and consider switching her from Tamoxifen to another oral agent such as an aromatase inhibitor. Reviewed chart and it doesn't appear that this office received lab results. Patient will call her OBGYN office and have them send over labs for Dr Antonieta Pert review.

## 2015-08-06 ENCOUNTER — Other Ambulatory Visit: Payer: Self-pay | Admitting: *Deleted

## 2015-08-06 DIAGNOSIS — D0502 Lobular carcinoma in situ of left breast: Secondary | ICD-10-CM

## 2015-08-09 ENCOUNTER — Other Ambulatory Visit: Payer: BC Managed Care – PPO

## 2015-08-17 ENCOUNTER — Other Ambulatory Visit (HOSPITAL_BASED_OUTPATIENT_CLINIC_OR_DEPARTMENT_OTHER): Payer: BC Managed Care – PPO

## 2015-08-17 DIAGNOSIS — C50919 Malignant neoplasm of unspecified site of unspecified female breast: Secondary | ICD-10-CM

## 2015-08-17 DIAGNOSIS — D0502 Lobular carcinoma in situ of left breast: Secondary | ICD-10-CM

## 2015-08-17 LAB — CBC WITH DIFFERENTIAL (CANCER CENTER ONLY)
BASO#: 0 10*3/uL (ref 0.0–0.2)
BASO%: 0.7 % (ref 0.0–2.0)
EOS ABS: 0.1 10*3/uL (ref 0.0–0.5)
EOS%: 2.2 % (ref 0.0–7.0)
HEMATOCRIT: 38.6 % (ref 34.8–46.6)
HEMOGLOBIN: 13.1 g/dL (ref 11.6–15.9)
LYMPH#: 2.3 10*3/uL (ref 0.9–3.3)
LYMPH%: 40.5 % (ref 14.0–48.0)
MCH: 30.7 pg (ref 26.0–34.0)
MCHC: 33.9 g/dL (ref 32.0–36.0)
MCV: 90 fL (ref 81–101)
MONO#: 0.4 10*3/uL (ref 0.1–0.9)
MONO%: 7.7 % (ref 0.0–13.0)
NEUT%: 48.9 % (ref 39.6–80.0)
NEUTROS ABS: 2.7 10*3/uL (ref 1.5–6.5)
Platelets: 269 10*3/uL (ref 145–400)
RBC: 4.27 10*6/uL (ref 3.70–5.32)
RDW: 12.5 % (ref 11.1–15.7)
WBC: 5.6 10*3/uL (ref 3.9–10.0)

## 2015-08-18 LAB — COMPREHENSIVE METABOLIC PANEL (CC13)
ALBUMIN: 4.2 g/dL (ref 3.5–5.5)
ALK PHOS: 48 IU/L (ref 39–117)
ALT: 19 IU/L (ref 0–32)
AST (SGOT): 20 IU/L (ref 0–40)
Albumin/Globulin Ratio: 1.6 (ref 1.1–2.5)
BUN / CREAT RATIO: 18 (ref 9–23)
BUN: 10 mg/dL (ref 6–24)
CHLORIDE: 103 mmol/L (ref 96–106)
CO2: 22 mmol/L (ref 18–29)
CREATININE: 0.56 mg/dL — AB (ref 0.57–1.00)
Calcium, Ser: 9 mg/dL (ref 8.7–10.2)
GFR calc Af Amer: 128 mL/min/{1.73_m2} (ref >59)
GFR calc non Af Amer: 111 mL/min/{1.73_m2} (ref >59)
GLUCOSE: 118 mg/dL — AB (ref 65–99)
Globulin, Total: 2.6 g/dL (ref 1.5–4.5)
Potassium, Ser: 4.1 mmol/L (ref 3.5–5.2)
Sodium: 139 mmol/L (ref 134–144)
TOTAL PROTEIN: 6.8 g/dL (ref 6.0–8.5)

## 2015-08-18 LAB — FOLLICLE STIMULATING HORMONE: FSH: 31.3 m[IU]/mL

## 2015-08-18 LAB — LUTEINIZING HORMONE: LH: 23.1 m[IU]/mL

## 2015-08-18 LAB — VITAMIN D 25 HYDROXY (VIT D DEFICIENCY, FRACTURES): Vitamin D, 25-Hydroxy: 35.2 ng/mL (ref 30.0–100.0)

## 2015-09-17 ENCOUNTER — Ambulatory Visit (HOSPITAL_BASED_OUTPATIENT_CLINIC_OR_DEPARTMENT_OTHER): Payer: BC Managed Care – PPO | Admitting: Hematology & Oncology

## 2015-09-17 VITALS — BP 111/62 | HR 75 | Temp 98.4°F | Resp 20 | Wt 183.0 lb

## 2015-09-17 DIAGNOSIS — Z170 Estrogen receptor positive status [ER+]: Secondary | ICD-10-CM | POA: Diagnosis not present

## 2015-09-17 DIAGNOSIS — Z79810 Long term (current) use of selective estrogen receptor modulators (SERMs): Secondary | ICD-10-CM

## 2015-09-17 DIAGNOSIS — D0502 Lobular carcinoma in situ of left breast: Secondary | ICD-10-CM

## 2015-09-17 DIAGNOSIS — M8580 Other specified disorders of bone density and structure, unspecified site: Secondary | ICD-10-CM

## 2015-09-17 MED ORDER — EXEMESTANE 25 MG PO TABS: 25.0000 mg | ORAL_TABLET | Freq: Every day | ORAL | Status: DC

## 2015-09-17 NOTE — Progress Notes (Signed)
Hematology and Oncology Follow Up Visit  KAMYAH WILHELMSEN [id number] Jun 19, 1966 50 y.o. 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 Binney is in for follow-up. She is not had a monthly cycle since September. We did check her LH and FSH. They're both high. That would indicate that she is postmenopausal.  Otherwise, she is doing okay. She's busy trying to help take care of her father. He has health issues. He has Alzheimer's disease.  She did have a MRI done in November 2016. The MRI was normal. A 3-D mammogram was also was done. This was normal. It was recommended that she not have another mammogram for a year.  She currently is on tamoxifen. I think that with the hormone status, who provided switch her over to Aromasin. I did this should be very reasonable.  She will need to have a bone density test done. She says she's never had one done.  I'll have to see if she has had genetic testing. I would have thought that she would have this by now. Her sister was negative for the BRCA mutation  Her overall performance status is ECOG 0.   Medications:  Current outpatient prescriptions:  .  Calcium Carbonate-Vitamin D (CALCIUM + D PO), Take by mouth., Disp: , Rfl:  .  Cholecalciferol (VITAMIN D) 2000 UNITS tablet, Take 2,000 Units by mouth daily., Disp: , Rfl:  .  cholestyramine (QUESTRAN) 4 GM/DOSE powder, 4 g as needed. , Disp: , Rfl:  .  KRILL OIL OMEGA-3 PO, Take by mouth., Disp: , Rfl:  .  Multiple Vitamin (MULTIVITAMIN) tablet, Take 1 tablet by mouth daily., Disp: , Rfl:  .  OVER THE COUNTER MEDICATION, , Disp: , Rfl:  .  SYNTHROID 100 MCG tablet, TK 1 T PO D, Disp: , Rfl: 2 .  aspirin-acetaminophen-caffeine (EXCEDRIN MIGRAINE) 250-250-65 MG per tablet, Take by mouth every 6 (six) hours as needed for headache., Disp: , Rfl:  .  exemestane (AROMASIN) 25 MG  tablet, Take 1 tablet (25 mg total) by mouth daily after breakfast., Disp: 30 tablet, Rfl: 12 .  hyoscyamine (ANASPAZ) 0.125 MG TBDP disintergrating tablet, Place 0.125 mg under the tongue every 4 (four) hours as needed for cramping., Disp: , Rfl:   Allergies:  Allergies  Allergen Reactions  . Penicillins Rash    Past Medical History, Surgical history, Social history, and Family History were reviewed and updated.  Review of Systems: As above  Physical Exam:  weight is 183 lb (83.008 kg). Her oral temperature is 98.4 F (36.9 C). Her blood pressure is 111/62 and her pulse is 75. Her respiration is 20.   Wt Readings from Last 3 Encounters:  09/17/15 183 lb (83.008 kg)  06/26/15 183 lb (83.008 kg)  06/13/15 185 lb (83.915 kg)     Well-developed well-nourished white female in no obvious distress. Head and neck exam shows no ocular or oral lesions. There are no palpable cervical or supraclavicular lymph nodes. Lungs are clear. Cardiac exam regular rate and rhythm with no murmurs, rubs or bruits. Abdomen is soft. She has good bowel sounds. There is no fluid wave. There may be some slight tenderness in the left lower quadrant to palpation. No fluid wave is noted. There is no palpable liver or spleen tip. Breast exam shows right breast with no masses, edema  or erythema. There is no right axillary adenopathy. Left breast shows a well-healed lumpectomy at the 1:00 position. She has no firmness at the lumpectomy site. She has no distinct mass in the left breast. There is no left axillary adenopathy. Extremities shows no clubbing, cyanosis or edema. She has good range of motion of his joints. Back exam shows no tenderness over the spine, ribs or hips. Skin exam shows no rashes, ecchymoses or petechia. Neurological exam shows no focal neurological deficits.  Lab Results  Component Value Date   WBC 5.6 08/17/2015   HGB 13.1 08/17/2015   HCT 38.6 08/17/2015   MCV 90 08/17/2015   PLT 269 08/17/2015       Chemistry      Component Value Date/Time   NA 139 08/17/2015 1416   NA 140 06/13/2015 1319   NA 138 12/11/2014 1318   NA 138 02/10/2014 1449   K 4.1 08/17/2015 1416   K 3.7 06/13/2015 1319   K 3.9 12/11/2014 1318   K 3.7 02/10/2014 1449   CL 103 08/17/2015 1416   CL 102 12/11/2014 1318   CL 102 02/10/2014 1449   CO2 22 08/17/2015 1416   CO2 25 06/13/2015 1319   CO2 30 12/11/2014 1318   CO2 24 02/10/2014 1449   BUN 10 08/17/2015 1416   BUN 8.9 06/13/2015 1319   BUN 7 12/11/2014 1318   BUN 8 02/10/2014 1449   CREATININE 0.56* 08/17/2015 1416   CREATININE 0.7 06/13/2015 1319   CREATININE 0.7 12/11/2014 1318   CREATININE 0.80 02/10/2014 1449      Component Value Date/Time   CALCIUM 9.0 08/17/2015 1416   CALCIUM 9.1 06/13/2015 1319   CALCIUM 9.3 12/11/2014 1318   CALCIUM 9.6 02/10/2014 1449   ALKPHOS 48 08/17/2015 1416   ALKPHOS 53 06/13/2015 1319   ALKPHOS 48 12/11/2014 1318   AST 20 08/17/2015 1416   AST 20 06/13/2015 1319   AST 21 12/11/2014 1318   ALT 19 08/17/2015 1416   ALT 15 06/13/2015 1319   ALT 17 12/11/2014 1318   BILITOT <0.2 08/17/2015 1416   BILITOT 0.39 06/13/2015 1319   BILITOT 0.50 12/11/2014 1318         Impression and Plan: Ms. Montijo is 50 year old white with lobular carcinoma in situ. She understands that this is not breast cancer but a marker for breast cancer. There is a risk of breast cancer given her family history, despite the negative BRCA test with her sister.  Again, I think she is postmenopausal. As such, we will get her on Aromasin. I talked her about the side effects of Aromasin. I told her about the possibility of arthralgias. I do want to get the bone density test on her.  I will like to see her back in about 6 weeks. If all looks good 6 weeks, then we are probably get her back in 3 months.  I spent about  30 minutes with her today. It was nice talking to her   [name], MD 2/20/20175:33 PM

## 2015-09-20 ENCOUNTER — Encounter: Payer: Self-pay | Admitting: Hematology & Oncology

## 2015-09-28 ENCOUNTER — Ambulatory Visit (HOSPITAL_BASED_OUTPATIENT_CLINIC_OR_DEPARTMENT_OTHER)
Admission: RE | Admit: 2015-09-28 | Discharge: 2015-09-28 | Disposition: A | Discharge status: Home / Self Care | Payer: BC Managed Care – PPO | Source: Ambulatory Visit | Attending: Hematology & Oncology | Admitting: Hematology & Oncology

## 2015-09-28 DIAGNOSIS — M899 Disorder of bone, unspecified: Secondary | ICD-10-CM | POA: Diagnosis not present

## 2015-09-28 DIAGNOSIS — M8580 Other specified disorders of bone density and structure, unspecified site: Secondary | ICD-10-CM

## 2015-09-28 DIAGNOSIS — Z780 Asymptomatic menopausal state: Secondary | ICD-10-CM | POA: Diagnosis not present

## 2015-09-28 DIAGNOSIS — D0502 Lobular carcinoma in situ of left breast: Secondary | ICD-10-CM | POA: Diagnosis not present

## 2015-09-28 DIAGNOSIS — Z79899 Other long term (current) drug therapy: Secondary | ICD-10-CM | POA: Insufficient documentation

## 2015-10-24 ENCOUNTER — Encounter: Payer: Self-pay | Admitting: Hematology & Oncology

## 2015-10-24 ENCOUNTER — Ambulatory Visit (HOSPITAL_BASED_OUTPATIENT_CLINIC_OR_DEPARTMENT_OTHER): Payer: BC Managed Care – PPO | Admitting: Hematology & Oncology

## 2015-10-24 ENCOUNTER — Other Ambulatory Visit (HOSPITAL_BASED_OUTPATIENT_CLINIC_OR_DEPARTMENT_OTHER): Payer: BC Managed Care – PPO

## 2015-10-24 VITALS — BP 104/57 | HR 73 | Temp 98.4°F | Resp 16 | Ht 67.0 in | Wt 185.0 lb

## 2015-10-24 DIAGNOSIS — T386X5A Adverse effect of antigonadotrophins, antiestrogens, antiandrogens, not elsewhere classified, initial encounter: Secondary | ICD-10-CM

## 2015-10-24 DIAGNOSIS — D0502 Lobular carcinoma in situ of left breast: Secondary | ICD-10-CM

## 2015-10-24 DIAGNOSIS — M810 Age-related osteoporosis without current pathological fracture: Secondary | ICD-10-CM

## 2015-10-24 DIAGNOSIS — M818 Other osteoporosis without current pathological fracture: Secondary | ICD-10-CM

## 2015-10-24 DIAGNOSIS — M8580 Other specified disorders of bone density and structure, unspecified site: Secondary | ICD-10-CM

## 2015-10-24 LAB — CBC WITH DIFFERENTIAL (CANCER CENTER ONLY)
BASO#: 0 10*3/uL (ref 0.0–0.2)
BASO%: 0.5 % (ref 0.0–2.0)
EOS ABS: 0.1 10*3/uL (ref 0.0–0.5)
EOS%: 2.3 % (ref 0.0–7.0)
HEMATOCRIT: 39.2 % (ref 34.8–46.6)
HEMOGLOBIN: 13.1 g/dL (ref 11.6–15.9)
LYMPH#: 2.2 10*3/uL (ref 0.9–3.3)
LYMPH%: 38 % (ref 14.0–48.0)
MCH: 31.2 pg (ref 26.0–34.0)
MCHC: 33.4 g/dL (ref 32.0–36.0)
MCV: 93 fL (ref 81–101)
MONO#: 0.5 10*3/uL (ref 0.1–0.9)
MONO%: 9.2 % (ref 0.0–13.0)
NEUT%: 50 % (ref 39.6–80.0)
NEUTROS ABS: 2.9 10*3/uL (ref 1.5–6.5)
Platelets: 237 10*3/uL (ref 145–400)
RBC: 4.2 10*6/uL (ref 3.70–5.32)
RDW: 12.7 % (ref 11.1–15.7)
WBC: 5.7 10*3/uL (ref 3.9–10.0)

## 2015-10-24 LAB — COMPREHENSIVE METABOLIC PANEL
ALBUMIN: 4 g/dL (ref 3.5–5.0)
ALK PHOS: 53 U/L (ref 40–150)
ALT: 14 U/L (ref 0–55)
AST: 17 U/L (ref 5–34)
Anion Gap: 9 mEq/L (ref 3–11)
BUN: 7.9 mg/dL (ref 7.0–26.0)
CALCIUM: 9.3 mg/dL (ref 8.4–10.4)
CHLORIDE: 104 meq/L (ref 98–109)
CO2: 27 mEq/L (ref 22–29)
Creatinine: 0.7 mg/dL (ref 0.6–1.1)
Glucose: 91 mg/dl (ref 70–140)
POTASSIUM: 3.7 meq/L (ref 3.5–5.1)
SODIUM: 139 meq/L (ref 136–145)
Total Bilirubin: 0.32 mg/dL (ref 0.20–1.20)
Total Protein: 7.2 g/dL (ref 6.4–8.3)

## 2015-10-24 NOTE — Progress Notes (Signed)
Hematology and Oncology Follow Up Visit  ACHOL WILCUTT [id number] Oct 03, 1965 50 y.o. 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 Riedinger is in for follow-up. She is [place] well. She's had no problems with the Aromasin.    she's had no fever, sweats or chills. She denies any kind of bony pain. There is no change in bowel or bladder habits. She's had no leg swelling. She's had no joint swelling.  She's had no cough. She's had no nausea or vomiting.  She did have a bone density test done. This did not show any osteoporosis.  She's had no headache. She's had no rashes.  Her father has Alzheimer's. She is trying to help take care of him.  Her overall performance status is ECOG 0.   Medications:  Current outpatient prescriptions:  .  aspirin-acetaminophen-caffeine (EXCEDRIN MIGRAINE) 250-250-65 MG per tablet, Take by mouth every 6 (six) hours as needed for headache., Disp: , Rfl:  .  Calcium Carbonate-Vitamin D (CALCIUM + D PO), Take by mouth., Disp: , Rfl:  .  Cholecalciferol (VITAMIN D) 2000 UNITS tablet, Take 1,000 Units by mouth daily. , Disp: , Rfl:  .  exemestane (AROMASIN) 25 MG tablet, Take 1 tablet (25 mg total) by mouth daily after breakfast., Disp: 30 tablet, Rfl: 12 .  KRILL OIL OMEGA-3 PO, Take by mouth., Disp: , Rfl:  .  Multiple Vitamin (MULTIVITAMIN) tablet, Take 1 tablet by mouth daily., Disp: , Rfl:  .  SYNTHROID 100 MCG tablet, TK 1 T PO D, Disp: , Rfl: 2 .  OVER THE COUNTER MEDICATION, , Disp: , Rfl:   Allergies:  Allergies  Allergen Reactions  . Penicillins Rash    Past Medical History, Surgical history, Social history, and Family History were reviewed and updated.  Review of Systems: As above  Physical Exam:  height is 5\' 7"  (1.702 m) and weight is 185 lb (83.915 kg). Her oral temperature is 98.4 F (36.9 C). Her blood pressure is  104/57 and her pulse is 73. Her respiration is 16.   Wt Readings from Last 3 Encounters:  10/24/15 185 lb (83.915 kg)  09/17/15 183 lb (83.008 kg)  06/26/15 183 lb (83.008 kg)     Well-developed well-nourished white female in no obvious distress. Head and neck exam shows no ocular or oral lesions. There are no palpable cervical or supraclavicular lymph nodes. Lungs are clear. Cardiac exam regular rate and rhythm with no murmurs, rubs or bruits. Abdomen is soft. She has good bowel sounds. There is no fluid wave. There may be some slight tenderness in the left lower quadrant to palpation. No fluid wave is noted. There is no palpable liver or spleen tip. Breast exam shows right breast with no masses, edema or erythema. There is no right axillary adenopathy. Left breast shows a well-healed lumpectomy at the 1:00 position. She has no firmness at the lumpectomy site. She has no distinct mass in the left breast. There is no left axillary adenopathy. Extremities shows no clubbing, cyanosis or edema. She has good range of motion of his joints. Back exam shows no tenderness over the spine, ribs or hips. Skin exam shows no rashes, ecchymoses or petechia. Neurological exam shows no focal neurological deficits.  Lab Results  Component Value Date   WBC 5.7 10/24/2015   HGB 13.1 10/24/2015  HCT 39.2 10/24/2015   MCV 93 10/24/2015   PLT 237 10/24/2015     Chemistry      Component Value Date/Time   NA 139 08/17/2015 1416   NA 140 06/13/2015 1319   NA 138 12/11/2014 1318   NA 138 02/10/2014 1449   K 4.1 08/17/2015 1416   K 3.7 06/13/2015 1319   K 3.9 12/11/2014 1318   K 3.7 02/10/2014 1449   CL 103 08/17/2015 1416   CL 102 12/11/2014 1318   CL 102 02/10/2014 1449   CO2 22 08/17/2015 1416   CO2 25 06/13/2015 1319   CO2 30 12/11/2014 1318   CO2 24 02/10/2014 1449   BUN 10 08/17/2015 1416   BUN 8.9 06/13/2015 1319   BUN 7 12/11/2014 1318   BUN 8 02/10/2014 1449   CREATININE 0.56* 08/17/2015 1416    CREATININE 0.7 06/13/2015 1319   CREATININE 0.7 12/11/2014 1318   CREATININE 0.80 02/10/2014 1449      Component Value Date/Time   CALCIUM 9.0 08/17/2015 1416   CALCIUM 9.1 06/13/2015 1319   CALCIUM 9.3 12/11/2014 1318   CALCIUM 9.6 02/10/2014 1449   ALKPHOS 48 08/17/2015 1416   ALKPHOS 53 06/13/2015 1319   ALKPHOS 48 12/11/2014 1318   AST 20 08/17/2015 1416   AST 20 06/13/2015 1319   AST 21 12/11/2014 1318   ALT 19 08/17/2015 1416   ALT 15 06/13/2015 1319   ALT 17 12/11/2014 1318   BILITOT <0.2 08/17/2015 1416   BILITOT 0.39 06/13/2015 1319   BILITOT 0.50 12/11/2014 1318         Impression and Plan: Ms. Downie is 50 year old white female. She just had a birthday this past Monday.  She's a well on the Aromasin.  I am happy that her bone density test did not show any evidence of osteoporosis.  I think we can probably get her back now in 4 months. I think this would be very reasonable.  I think she is due for a mammogram in November.  There is a family history of breast cancer. I will have to see if she has ever had any kind of genetic testing.  I spent about  30 minutes with her today. It was nice talking to her   [name], MD 3/29/20172:36 PM

## 2015-10-25 LAB — VITAMIN D 25 HYDROXY (VIT D DEFICIENCY, FRACTURES): VIT D 25 HYDROXY: 43.3 ng/mL (ref 30.0–100.0)

## 2015-10-26 ENCOUNTER — Encounter: Payer: Self-pay | Admitting: Nurse Practitioner

## 2015-12-10 ENCOUNTER — Ambulatory Visit (INDEPENDENT_AMBULATORY_CARE_PROVIDER_SITE_OTHER): Payer: BC Managed Care – PPO | Admitting: Neurology

## 2015-12-10 ENCOUNTER — Encounter: Payer: Self-pay | Admitting: Neurology

## 2015-12-10 VITALS — BP 118/78 | HR 82 | Ht 67.0 in | Wt 189.0 lb

## 2015-12-10 DIAGNOSIS — G43709 Chronic migraine without aura, not intractable, without status migrainosus: Secondary | ICD-10-CM | POA: Diagnosis not present

## 2015-12-10 DIAGNOSIS — IMO0002 Reserved for concepts with insufficient information to code with codable children: Secondary | ICD-10-CM

## 2015-12-10 DIAGNOSIS — G43909 Migraine, unspecified, not intractable, without status migrainosus: Secondary | ICD-10-CM | POA: Insufficient documentation

## 2015-12-10 MED ORDER — RIZATRIPTAN BENZOATE 10 MG PO TBDP: 10.0000 mg | ORAL_TABLET | ORAL | Status: DC | PRN

## 2015-12-10 MED ORDER — ONDANSETRON 4 MG PO TBDP: 4.0000 mg | ORAL_TABLET | Freq: Three times a day (TID) | ORAL | Status: DC | PRN

## 2015-12-10 NOTE — Progress Notes (Signed)
PATIENT: [name] DOB: 08-07-1965  Chief Complaint  Patient presents with  . Migraine    Reports over the last five years she has noticed a steady increase in migraines.  She has nausea and vomiting associated with her headaches.  She has also noticed worsening of her blurred vision.  She has been using OTC NSAIDS with only minimal relief.  She has never been on any prophylactic medications.     HISTORICAL  [name] is a 50 years old right-handed female, seen in refer by Dr. Arnetha Gula for evaluation of increased migraine headache in Dec 10 2015  She had a history of left breast cancer, status post left lobectomy in November 2014, taking Aromasin  She reported a history of migraine since 1994, her typical migraine are severe pounding headache with associated light noise sensitivity, nauseous, lasting for 2-3 days, she has to take off from work, she had frequent headaches around her mid thirties, even then she has headaches about every few months,  Since January 2017, after she stopped having migraines for few years, she began to have headaches again, she is under a lot of stress, taking care of her elderly father who suffered Alzheimer's disease  She is taking over-the-counter Excedrin Migraine with limited help, has tried different over-the-counter medications, Tylenol, Aleve, ice pack, pressure band with no help, she has never tried prescription medications   She has gun through menopause from September 20 sixteenth of February 7017.  Laboratory evaluation in December 2016, normal thyroid function test, F S H 30.7 normal CMP with creatinine of 0.65, lipid profile showed normal cholesterol 151, LDL 87, vitamin D level was within normal limits 40    REVIEW OF SYSTEMS: Full 14 system review of systems performed and notable only for Blurred vision, headache, dizziness, insomnia, not enough sleep  ALLERGIES: Allergies  Allergen Reactions  . Penicillins Rash    HOME  MEDICATIONS: Current Outpatient Prescriptions  Medication Sig Dispense Refill  . aspirin-acetaminophen-caffeine (EXCEDRIN MIGRAINE) 250-250-65 MG per tablet Take by mouth every 6 (six) hours as needed for headache.    . Calcium Carbonate-Vitamin D (CALCIUM + D PO) Take by mouth.    . Cholecalciferol (VITAMIN D) 2000 UNITS tablet Take 1,000 Units by mouth daily.     Marland Kitchen exemestane (AROMASIN) 25 MG tablet Take 1 tablet (25 mg total) by mouth daily after breakfast. 30 tablet 12  . KRILL OIL OMEGA-3 PO Take by mouth.    . Multiple Vitamin (MULTIVITAMIN) tablet Take 1 tablet by mouth daily.    Marland Kitchen OVER THE COUNTER MEDICATION [place] for sleep.    Marland Kitchen SYNTHROID 100 MCG tablet TK 1 T PO D  2   No current facility-administered medications for this visit.    PAST MEDICAL HISTORY: Past Medical History  Diagnosis Date  . Hyperlipidemia   . History of anemia   . Hypothyroidism   . Chronic diarrhea   . Cancer ([place])     left breast  . Complication of anesthesia     woke up agitated after gum surgery  . Migraine     PAST SURGICAL HISTORY: Past Surgical History  Procedure Laterality Date  . Gum surgery    . Breast lumpectomy with needle localization Left 06/20/2013    Procedure: BREAST LUMPECTOMY WITH NEEDLE LOCALIZATION;  Surgeon: Merrie Roof, MD;  Location: [facility];  Service: General;  Laterality: Left;    FAMILY HISTORY: Family History  Problem Relation Age of  Onset  . Breast cancer Mother 29  . Melanoma Father 62  . Breast cancer Sister 52    ER+/her2-; onco score = 8  . Cancer Maternal Aunt     oral cancer; smoker  . Head & neck cancer Paternal Uncle     smoker  . Breast cancer Cousin     dx in her late 63s-60s  . Heart disease Mother     Atrial fibrillation    SOCIAL HISTORY:  Social History   Social History  . Marital Status: Married    Spouse Name: [name]  . Number of Children: 0  . Years of Education: Masters   Occupational History  . Medical  Records    Social History Main Topics  . Smoking status: Never Smoker   . Smokeless tobacco: Never Used     Comment: NEVER USED TOBACCO  . Alcohol Use: No  . Drug Use: No  . Sexual Activity: Yes   Other Topics Concern  . Not on file   Social History Narrative   Lives at home with husband.   Right-handed.   2-3 cups caffeine daily.     PHYSICAL EXAM   Filed Vitals:   12/10/15 1055  BP: 118/78  Pulse: 82  Height: 5' 7" (1.702 m)  Weight: 189 lb (85.73 kg)    Not recorded      Body mass index is 29.59 kg/(m^2).  PHYSICAL EXAMNIATION:  Gen: NAD, conversant, well nourised, obese, well groomed                     Cardiovascular: Regular rate rhythm, no peripheral edema, warm, nontender. Eyes: Conjunctivae clear without exudates or hemorrhage Neck: Supple, no carotid bruise. Pulmonary: Clear to auscultation bilaterally   NEUROLOGICAL EXAM:  MENTAL STATUS: Speech:    Speech is normal; fluent and spontaneous with normal comprehension.  Cognition:     Orientation to time, place and person     Normal recent and remote memory     Normal Attention span and concentration     Normal Language, naming, repeating,spontaneous speech     Fund of knowledge   CRANIAL NERVES: CN II: Visual fields are full to confrontation. Fundoscopic exam is normal with sharp discs and no vascular changes. Pupils are round equal and briskly reactive to light. CN III, IV, VI: extraocular movement are normal. No ptosis. CN V: Facial sensation is intact to pinprick in all 3 divisions bilaterally. Corneal responses are intact.  CN VII: Face is symmetric with normal eye closure and smile. CN VIII: Hearing is normal to rubbing fingers CN IX, X: Palate elevates symmetrically. Phonation is normal. CN XI: Head turning and shoulder shrug are intact CN XII: Tongue is midline with normal movements and no atrophy.  MOTOR: There is no pronator drift of out-stretched arms. Muscle bulk and tone are  normal. Muscle strength is normal.  REFLEXES: Reflexes are 2+ and symmetric at the biceps, triceps, knees, and ankles. Plantar responses are flexor.  SENSORY: Intact to light touch, pinprick, positional sensation and vibratory sensation are intact in fingers and toes.  COORDINATION: Rapid alternating movements and fine finger movements are intact. There is no dysmetria on finger-to-nose and heel-knee-shin.    GAIT/STANCE: Posture is normal. Gait is steady with normal steps, base, arm swing, and turning. Heel and toe walking are normal. Tandem gait is normal.  Romberg is absent.   DIAGNOSTIC DATA (LABS, IMAGING, TESTING) - I reviewed patient records, labs, notes, testing and imaging myself  where available.   ASSESSMENT AND PLAN  Obie R Argabright is a 50 y.o. female   Chronic migraine  Maxalt 10 mg as needed  Zofran 4 mg as needed   Marcial Pacas, M.D. Ph.D.  [facility] [address] [place], [place] [address] Ph: [phone] Fax: [phone]  CC: Arvella Nigh, MD

## 2015-12-11 ENCOUNTER — Other Ambulatory Visit: Payer: BC Managed Care – PPO

## 2015-12-11 ENCOUNTER — Ambulatory Visit: Payer: BC Managed Care – PPO | Admitting: Family

## 2016-02-25 ENCOUNTER — Encounter: Payer: Self-pay | Admitting: Hematology & Oncology

## 2016-02-25 ENCOUNTER — Other Ambulatory Visit (HOSPITAL_BASED_OUTPATIENT_CLINIC_OR_DEPARTMENT_OTHER): Payer: BC Managed Care – PPO

## 2016-02-25 ENCOUNTER — Ambulatory Visit (HOSPITAL_BASED_OUTPATIENT_CLINIC_OR_DEPARTMENT_OTHER): Payer: BC Managed Care – PPO | Admitting: Hematology & Oncology

## 2016-02-25 VITALS — BP 116/71 | HR 70 | Temp 98.7°F | Resp 18 | Ht 67.0 in | Wt 198.1 lb

## 2016-02-25 DIAGNOSIS — D0502 Lobular carcinoma in situ of left breast: Secondary | ICD-10-CM

## 2016-02-25 DIAGNOSIS — M818 Other osteoporosis without current pathological fracture: Secondary | ICD-10-CM

## 2016-02-25 DIAGNOSIS — N939 Abnormal uterine and vaginal bleeding, unspecified: Secondary | ICD-10-CM

## 2016-02-25 DIAGNOSIS — T386X5A Adverse effect of antigonadotrophins, antiestrogens, antiandrogens, not elsewhere classified, initial encounter: Secondary | ICD-10-CM

## 2016-02-25 DIAGNOSIS — Z79811 Long term (current) use of aromatase inhibitors: Secondary | ICD-10-CM

## 2016-02-25 LAB — COMPREHENSIVE METABOLIC PANEL (CC13)
A/G RATIO: 1.5 (ref 1.2–2.2)
ALBUMIN: 4.6 g/dL (ref 3.5–5.5)
ALK PHOS: 78 IU/L (ref 39–117)
ALT: 17 IU/L (ref 0–32)
AST (SGOT): 15 IU/L (ref 0–40)
BILIRUBIN TOTAL: 0.3 mg/dL (ref 0.0–1.2)
BUN / CREAT RATIO: 14 (ref 9–23)
BUN: 9 mg/dL (ref 6–24)
CHLORIDE: 101 mmol/L (ref 96–106)
Calcium, Ser: 9.1 mg/dL (ref 8.7–10.2)
Carbon Dioxide, Total: 25 mmol/L (ref 18–29)
Creatinine, Ser: 0.64 mg/dL (ref 0.57–1.00)
GFR calc non Af Amer: 105 mL/min/{1.73_m2} (ref >59)
GFR, EST AFRICAN AMERICAN: 121 mL/min/{1.73_m2} (ref >59)
GLOBULIN, TOTAL: 3 g/dL (ref 1.5–4.5)
GLUCOSE: 93 mg/dL (ref 65–99)
Potassium, Ser: 3.5 mmol/L (ref 3.5–5.2)
SODIUM: 133 mmol/L — AB (ref 134–144)
Total Protein: 7.6 g/dL (ref 6.0–8.5)

## 2016-02-25 LAB — CBC WITH DIFFERENTIAL (CANCER CENTER ONLY)
BASO#: 0 10*3/uL (ref 0.0–0.2)
BASO%: 0.3 % (ref 0.0–2.0)
EOS%: 3.1 % (ref 0.0–7.0)
Eosinophils Absolute: 0.2 10*3/uL (ref 0.0–0.5)
HCT: 39.2 % (ref 34.8–46.6)
HGB: 13.1 g/dL (ref 11.6–15.9)
LYMPH#: 2.2 10*3/uL (ref 0.9–3.3)
LYMPH%: 34.5 % (ref 14.0–48.0)
MCH: 30.7 pg (ref 26.0–34.0)
MCHC: 33.4 g/dL (ref 32.0–36.0)
MCV: 92 fL (ref 81–101)
MONO#: 0.7 10*3/uL (ref 0.1–0.9)
MONO%: 10.6 % (ref 0.0–13.0)
NEUT#: 3.4 10*3/uL (ref 1.5–6.5)
NEUT%: 51.5 % (ref 39.6–80.0)
PLATELETS: 273 10*3/uL (ref 145–400)
RBC: 4.27 10*6/uL (ref 3.70–5.32)
RDW: 13.1 % (ref 11.1–15.7)
WBC: 6.5 10*3/uL (ref 3.9–10.0)

## 2016-02-25 NOTE — Progress Notes (Signed)
Hematology and Oncology Follow Up Visit  [name] [id number] 02-27-1966 50 y.o. 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 The main issue right now is postmenopausal bleeding. She is being followed closely by her gynecologist. She's had some investigations. She had a biopsy. We do not have the results back yet. She is being followed very closely by Dr. Ophelia Wilkins.   Her parents are having a tough time. Her dad has the Parkinson's disease.  She is still working. She cannot stop working right now.\  She was having some pain in the left nipple. This was a sharp type pain. She did not note any swelling, redness, blisters or mass. I think she is due for a mammogram in the fall.  She has had no cough. There's been no shortness of breath. She's had no headache. She did have some migraines. She is on some medicine for this which she takes under the tongue.  Her overall performance status is ECOG 0.   Medications:  Current Outpatient Prescriptions:  .  aspirin-acetaminophen-caffeine (EXCEDRIN MIGRAINE) 250-250-65 MG per tablet, Take by mouth every 6 (six) hours as needed for headache., Disp: , Rfl:  .  Calcium Carbonate-Vitamin D (CALCIUM + D PO), Take by mouth., Disp: , Rfl:  .  Cholecalciferol (VITAMIN D) 2000 UNITS tablet, Take 1,000 Units by mouth daily. , Disp: , Rfl:  .  exemestane (AROMASIN) 25 MG tablet, Take 1 tablet (25 mg total) by mouth daily after breakfast., Disp: 30 tablet, Rfl: 12 .  KRILL OIL OMEGA-3 PO, Take by mouth., Disp: , Rfl:  .  Multiple Vitamin (MULTIVITAMIN) tablet, Take 1 tablet by mouth daily., Disp: , Rfl:  .  ondansetron (ZOFRAN ODT) 4 MG disintegrating tablet, Take 1 tablet (4 mg total) by mouth every 8 (eight) hours as needed for nausea or vomiting., Disp: 20 tablet, Rfl: 3 .  OVER THE COUNTER MEDICATION, Zquil  for sleep., Disp: , Rfl:  .  rizatriptan (MAXALT-MLT) 10 MG disintegrating tablet, Take 1 tablet (10 mg total) by mouth as needed. May repeat in 2 hours if needed, Disp: 12 tablet, Rfl: 6 .  SYNTHROID 100 MCG tablet, TK 1 T PO D, Disp: , Rfl: 2  Allergies:  Allergies  Allergen Reactions  . Penicillins Rash    Past Medical History, Surgical history, Social history, and Family History were reviewed and updated.  Review of Systems: As above  Physical Exam:  height is 5\' 7"  (1.702 m) and weight is 198 lb 1.9 oz (89.9 kg). Her oral temperature is 98.7 F (37.1 C). Her blood pressure is 116/71 and her pulse is 70. Her respiration is 18.   Wt Readings from Last 3 Encounters:  02/25/16 198 lb 1.9 oz (89.9 kg)  12/10/15 189 lb (85.7 kg)  10/24/15 185 lb (83.9 kg)     Well-developed well-nourished white female in no obvious distress. Head and neck exam shows no ocular or oral lesions. There are no palpable cervical or supraclavicular lymph nodes. Lungs are clear. Cardiac exam regular rate and rhythm with no murmurs, rubs or bruits. Abdomen is soft. She has good bowel sounds. There is no fluid wave. There may be some slight tenderness in the left lower quadrant to palpation. No fluid wave is noted. There is no palpable liver or spleen tip. Breast  exam shows right breast with no masses, edema or erythema. There is no right axillary adenopathy. Left breast shows a well-healed lumpectomy at the 1:00 position. She has no firmness at the lumpectomy site. She has no distinct mass in the left breast. There is no swelling about the left nipple. There is no redness associated with the [name]. I see no rash. There is no left axillary adenopathy. Extremities shows no clubbing, cyanosis or edema. She has good range of motion of his joints. Back exam shows no tenderness over the spine, ribs or hips. Skin exam shows no rashes, ecchymoses or petechia. Neurological exam shows no focal neurological deficits.  Lab  Results  Component Value Date   WBC 6.5 02/25/2016   HGB 13.1 02/25/2016   HCT 39.2 02/25/2016   MCV 92 02/25/2016   PLT 273 02/25/2016     Chemistry      Component Value Date/Time   NA 139 10/24/2015 1319   K 3.7 10/24/2015 1319   CL 103 08/17/2015 1416   CL 102 12/11/2014 1318   CO2 27 10/24/2015 1319   BUN 7.9 10/24/2015 1319   CREATININE 0.7 10/24/2015 1319      Component Value Date/Time   CALCIUM 9.3 10/24/2015 1319   ALKPHOS 53 10/24/2015 1319   AST 17 10/24/2015 1319   ALT 14 10/24/2015 1319   BILITOT 0.32 10/24/2015 1319         Impression and Plan: [name] is 47 year old white female. Overall, condition is doing okay. She is postmenopausal.  He'll will be interesting to see what the pathology report shows. I told her to try to get me a copy if possible.   Otherwise, I think we do not have to do anything else. I don't think when he any early mammogram for this left nipple pain.  For now, we'll plan to get her back in 6 months.    [name], MD 7/31/20173:12 PM

## 2016-02-26 ENCOUNTER — Encounter: Payer: Self-pay | Admitting: *Deleted

## 2016-02-26 LAB — VITAMIN D 25 HYDROXY (VIT D DEFICIENCY, FRACTURES): VIT D 25 HYDROXY: 34.7 ng/mL (ref 30.0–100.0)

## 2016-02-26 LAB — FOLLICLE STIMULATING HORMONE: FSH: 7.5 m[IU]/mL

## 2016-02-26 LAB — LUTEINIZING HORMONE: LH: 8 m[IU]/mL

## 2016-02-29 LAB — ESTRADIOL, ULTRA SENS: Estradiol, Sensitive: 80.2 pg/mL

## 2016-03-13 ENCOUNTER — Encounter: Payer: Self-pay | Admitting: Neurology

## 2016-03-13 ENCOUNTER — Ambulatory Visit (INDEPENDENT_AMBULATORY_CARE_PROVIDER_SITE_OTHER): Payer: BC Managed Care – PPO | Admitting: Neurology

## 2016-03-13 VITALS — BP 113/79 | HR 72 | Ht 67.0 in | Wt 195.8 lb

## 2016-03-13 DIAGNOSIS — G43709 Chronic migraine without aura, not intractable, without status migrainosus: Secondary | ICD-10-CM | POA: Diagnosis not present

## 2016-03-13 DIAGNOSIS — IMO0002 Reserved for concepts with insufficient information to code with codable children: Secondary | ICD-10-CM

## 2016-03-13 MED ORDER — RIZATRIPTAN BENZOATE 10 MG PO TBDP: 10.0000 mg | ORAL_TABLET | ORAL | 4 refills | Status: DC | PRN

## 2016-03-13 MED ORDER — NORTRIPTYLINE HCL 10 MG PO CAPS: ORAL_CAPSULE | ORAL | 11 refills | Status: DC

## 2016-03-13 MED ORDER — SUMATRIPTAN SUCCINATE 6 MG/0.5ML ~~LOC~~ SOAJ: 6.0000 mg | SUBCUTANEOUS | 11 refills | Status: DC | PRN

## 2016-03-13 NOTE — Progress Notes (Signed)
-     PATIENT: [name] DOB: 10/10/1965  Chief Complaint  Patient presents with  . Migraine    Reports migraine pattern fluctuates.  She may go several weeks without a headache then have one 3-4 days in a row.  Maxalt gives some relief when taken early after onset and she nearly always needs the repeat dose in two hours.     HISTORICAL  [name] is a 50 years old right-handed female, seen in refer by Dr. Arnetha Gula for evaluation of increased migraine headache in Dec 10 2015  She had a history of left breast cancer, status post left lobectomy in November 2014, taking Aromasin  She reported a history of migraine since 1994, her typical migraine are severe pounding headache with associated light noise sensitivity, nauseous, lasting for 2-3 days, she has to take off from work, she had frequent headaches around her mid thirties, even then she has headaches about every few months,  Since January 2017, after she stopped having migraines for few years, she began to have headaches again, she is under a lot of stress, taking care of her elderly father who suffered Alzheimer's disease  She is taking over-the-counter Excedrin Migraine with limited help, has tried different over-the-counter medications, Tylenol, Aleve, ice pack, pressure band with no help, she has never tried prescription medications   She has gone through menopause from September 2016 to February 7017.  Laboratory evaluation in December 2016, normal thyroid function test, F S H 30.7 normal CMP with creatinine of 0.65, lipid profile showed normal cholesterol 151, LDL 87, vitamin D level was within normal limits 40   UPDATE August 17th 2017: Since last visit in May, she used all prescribed Maxalt for her migraine, average 1 typical migraine each week, Sometimes the migraine is prolonged, require multiple dose of Maxalt, she also complains of excessive stress at home, taking care of her elderly parents, difficulty  sleeping,   REVIEW OF SYSTEMS: Full 14 system review of systems performed and notable only for As above  ALLERGIES: Allergies  Allergen Reactions  . Penicillins Rash    HOME MEDICATIONS: Current Outpatient Prescriptions  Medication Sig Dispense Refill  . aspirin-acetaminophen-caffeine (EXCEDRIN MIGRAINE) 250-250-65 MG per tablet Take by mouth every 6 (six) hours as needed for headache.    . Calcium Carbonate-Vitamin D (CALCIUM + D PO) Take by mouth.    . Cholecalciferol (VITAMIN D) 2000 UNITS tablet Take 1,000 Units by mouth daily.     Marland Kitchen exemestane (AROMASIN) 25 MG tablet Take 1 tablet (25 mg total) by mouth daily after breakfast. 30 tablet 12  . KRILL OIL OMEGA-3 PO Take by mouth.    . Multiple Vitamin (MULTIVITAMIN) tablet Take 1 tablet by mouth daily.    . ondansetron (ZOFRAN ODT) 4 MG disintegrating tablet Take 1 tablet (4 mg total) by mouth every 8 (eight) hours as needed for nausea or vomiting. 20 tablet 3  . OVER THE COUNTER MEDICATION Villanueva for sleep.    . rizatriptan (MAXALT-MLT) 10 MG disintegrating tablet Take 1 tablet (10 mg total) by mouth as needed. May repeat in 2 hours if needed 12 tablet 6  . SYNTHROID 100 MCG tablet TK 1 T PO D  2   No current facility-administered medications for this visit.     PAST MEDICAL HISTORY: Past Medical History:  Diagnosis Date  . Cancer ([place])    left breast  . Chronic diarrhea   . Complication of anesthesia    woke  up agitated after gum surgery  . History of anemia   . Hyperlipidemia   . Hypothyroidism   . Migraine     PAST SURGICAL HISTORY: Past Surgical History:  Procedure Laterality Date  . BREAST LUMPECTOMY WITH NEEDLE LOCALIZATION Left 06/20/2013   Procedure: BREAST LUMPECTOMY WITH NEEDLE LOCALIZATION;  Surgeon: Merrie Roof, MD;  Location: [facility];  Service: General;  Laterality: Left;  . GUM SURGERY      FAMILY HISTORY: Family History  Problem Relation Age of Onset  . Breast cancer Mother 16   . Heart disease Mother     Atrial fibrillation  . Melanoma Father 12  . Breast cancer Sister 81    ER+/her2-; onco score = 8  . Cancer Maternal Aunt     oral cancer; smoker  . Head & neck cancer Paternal Uncle     smoker  . Breast cancer Cousin     dx in her late 41s-60s    SOCIAL HISTORY:  Social History   Social History  . Marital status: Married    Spouse name: [name]  . Number of children: 0  . Years of education: Masters   Occupational History  . Medical Records    Social History Main Topics  . Smoking status: Never Smoker  . Smokeless tobacco: Never Used     Comment: NEVER USED TOBACCO  . Alcohol use No  . Drug use: No  . Sexual activity: Yes   Other Topics Concern  . Not on file   Social History Narrative   Lives at home with husband.   Right-handed.   2-3 cups caffeine daily.     PHYSICAL EXAM   Vitals:   03/13/16 0739  BP: 113/79  Pulse: 72  Weight: 195 lb 12 oz (88.8 kg)  Height: _0  (1.702 m)    Not recorded      Body mass index is 30.66 kg/m.  PHYSICAL EXAMNIATION:  Gen: NAD, conversant, well nourised, obese, well groomed                     Cardiovascular: Regular rate rhythm, no peripheral edema, warm, nontender. Eyes: Conjunctivae clear without exudates or hemorrhage Neck: Supple, no carotid bruise. Pulmonary: Clear to auscultation bilaterally   NEUROLOGICAL EXAM:  MENTAL STATUS: Speech:    Speech is normal; fluent and spontaneous with normal comprehension.  Cognition:     Orientation to time, place and person     Normal recent and remote memory     Normal Attention span and concentration     Normal Language, naming, repeating,spontaneous speech     Fund of knowledge   CRANIAL NERVES: CN II: Visual fields are full to confrontation. Fundoscopic exam is normal with sharp discs and no vascular changes. Pupils are round equal and briskly reactive to light. CN III, IV, VI: extraocular movement are normal. No ptosis. CN V:  Facial sensation is intact to pinprick in all 3 divisions bilaterally. Corneal responses are intact.  CN VII: Face is symmetric with normal eye closure and smile. CN VIII: Hearing is normal to rubbing fingers CN IX, X: Palate elevates symmetrically. Phonation is normal. CN XI: Head turning and shoulder shrug are intact CN XII: Tongue is midline with normal movements and no atrophy.  MOTOR: There is no pronator drift of out-stretched arms. Muscle bulk and tone are normal. Muscle strength is normal.  REFLEXES: Reflexes are 2+ and symmetric at the biceps, triceps, knees, and ankles. Plantar  responses are flexor.  SENSORY: Intact to light touch, pinprick, positional sensation and vibratory sensation are intact in fingers and toes.  COORDINATION: Rapid alternating movements and fine finger movements are intact. There is no dysmetria on finger-to-nose and heel-knee-shin.    GAIT/STANCE: Posture is normal. Gait is steady with normal steps, base, arm swing, and turning. Heel and toe walking are normal. Tandem gait is normal.  Romberg is absent.   DIAGNOSTIC DATA (LABS, IMAGING, TESTING) - I reviewed patient records, labs, notes, testing and imaging myself where available.   ASSESSMENT AND PLAN  Harper R Remigio is a 50 y.o. female   Chronic migraine  After discuss with patient we will start preventive medications, no drooping 10 mg titrating to 20 mg every day  Maxalt 10 mg as needed  Zofran 4 mg as needed  Imitrex 6 milligram as needed subcutaneous injection for his severe migraine headaches   Marcial Pacas, M.D. Ph.D.  [facility] [address] [place], [place] [address] Ph: [phone] Fax: [phone]  CC: Arvella Nigh, MD

## 2016-04-29 ENCOUNTER — Other Ambulatory Visit: Payer: Self-pay | Admitting: Hematology & Oncology

## 2016-04-29 DIAGNOSIS — Z1231 Encounter for screening mammogram for malignant neoplasm of breast: Secondary | ICD-10-CM

## 2016-05-29 ENCOUNTER — Telehealth: Payer: Self-pay | Admitting: *Deleted

## 2016-05-29 NOTE — Telephone Encounter (Signed)
Patient is having some GYN issues and is scheduled for a hysterectomy/oopherectomy and needs to speak with Dr Marin Olp regarding her Aromasin. She is scheduled for November 21st. Spoke to Dr Marin Olp and he wants patient to come in for appointment. Patient aware and message sent to scheduler.

## 2016-05-30 ENCOUNTER — Ambulatory Visit (HOSPITAL_BASED_OUTPATIENT_CLINIC_OR_DEPARTMENT_OTHER): Payer: BC Managed Care – PPO | Admitting: Hematology & Oncology

## 2016-05-30 ENCOUNTER — Other Ambulatory Visit (HOSPITAL_BASED_OUTPATIENT_CLINIC_OR_DEPARTMENT_OTHER): Payer: BC Managed Care – PPO

## 2016-05-30 VITALS — BP 117/71 | HR 84 | Temp 98.4°F | Resp 20 | Wt 202.1 lb

## 2016-05-30 DIAGNOSIS — Z170 Estrogen receptor positive status [ER+]: Secondary | ICD-10-CM | POA: Diagnosis not present

## 2016-05-30 DIAGNOSIS — Z79811 Long term (current) use of aromatase inhibitors: Secondary | ICD-10-CM

## 2016-05-30 DIAGNOSIS — D0502 Lobular carcinoma in situ of left breast: Secondary | ICD-10-CM

## 2016-05-30 DIAGNOSIS — T386X5A Adverse effect of antigonadotrophins, antiestrogens, antiandrogens, not elsewhere classified, initial encounter: Secondary | ICD-10-CM

## 2016-05-30 DIAGNOSIS — M818 Other osteoporosis without current pathological fracture: Secondary | ICD-10-CM

## 2016-05-30 LAB — COMPREHENSIVE METABOLIC PANEL
ALT: 11 U/L (ref 0–55)
AST: 16 U/L (ref 5–34)
Albumin: 4 g/dL (ref 3.5–5.0)
Alkaline Phosphatase: 71 U/L (ref 40–150)
Anion Gap: 9 mEq/L (ref 3–11)
BUN: 10 mg/dL (ref 7.0–26.0)
CALCIUM: 9.2 mg/dL (ref 8.4–10.4)
CHLORIDE: 104 meq/L (ref 98–109)
CO2: 25 mEq/L (ref 22–29)
CREATININE: 0.7 mg/dL (ref 0.6–1.1)
EGFR: >90 mL/min/{1.73_m2} (ref >90)
GLUCOSE: 71 mg/dL (ref 70–140)
Potassium: 3.8 mEq/L (ref 3.5–5.1)
Sodium: 138 mEq/L (ref 136–145)
Total Bilirubin: 0.83 mg/dL (ref 0.20–1.20)
Total Protein: 7.3 g/dL (ref 6.4–8.3)

## 2016-05-30 LAB — CBC WITH DIFFERENTIAL (CANCER CENTER ONLY)
BASO#: 0 10*3/uL (ref 0.0–0.2)
BASO%: 0.3 % (ref 0.0–2.0)
EOS%: 1.2 % (ref 0.0–7.0)
Eosinophils Absolute: 0.1 10*3/uL (ref 0.0–0.5)
HEMATOCRIT: 38.5 % (ref 34.8–46.6)
HEMOGLOBIN: 12.8 g/dL (ref 11.6–15.9)
LYMPH#: 1.9 10*3/uL (ref 0.9–3.3)
LYMPH%: 32 % (ref 14.0–48.0)
MCH: 29 pg (ref 26.0–34.0)
MCHC: 33.2 g/dL (ref 32.0–36.0)
MCV: 87 fL (ref 81–101)
MONO#: 0.6 10*3/uL (ref 0.1–0.9)
MONO%: 9.4 % (ref 0.0–13.0)
NEUT%: 57.1 % (ref 39.6–80.0)
NEUTROS ABS: 3.4 10*3/uL (ref 1.5–6.5)
Platelets: 325 10*3/uL (ref 145–400)
RBC: 4.42 10*6/uL (ref 3.70–5.32)
RDW: 12.9 % (ref 11.1–15.7)
WBC: 6 10*3/uL (ref 3.9–10.0)

## 2016-05-30 NOTE — Progress Notes (Signed)
Hematology and Oncology Follow Up Visit  ALIYAHA AULAKH [id number] February 02, 1966 50 y.o. 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 Matheny is in for an early follow-up. The main issue right now is postmenopausal bleeding. She has decided to have a hysterectomy. She'll have her ovaries taken out. She is still having issues with the bleeding. She had a biopsy earlier this year. The biopsy did not come back with any malignancy. I suppose that she can always still have an isolated area of malignancy that might because of this problem.   I told her that taking the Aromasin would have no effect on her in the postmenopausal state. In fact, I that she probably will do better given that she does not have any actual ovaries.   She still is in good shape. She does spend quite a bit of time taking care of her parents who have some illnesses.   Her surgery will be November 21. I'll make sure to stop by and see her in the hospital or when she is there overnight.   Overall, she is doing well. She still working. She does have quite a bit of stress at work.   She's had a good appetite. She's had no obvious change in bowel or bladder habits.  Her overall performance status is ECOG 0.   Medications:  Current Outpatient Prescriptions:  .  aspirin-acetaminophen-caffeine (EXCEDRIN MIGRAINE) 250-250-65 MG per tablet, Take by mouth every 6 (six) hours as needed for headache., Disp: , Rfl:  .  Calcium Carbonate-Vitamin D (CALCIUM + D PO), Take by mouth., Disp: , Rfl:  .  cholecalciferol (VITAMIN D) 1000 units tablet, Take 1,000 Units by mouth daily. , Disp: , Rfl:  .  cyanocobalamin 500 MCG tablet, Take 500 mcg by mouth daily., Disp: , Rfl:  .  exemestane (AROMASIN) 25 MG tablet, Take 1 tablet (25 mg total) by mouth daily after breakfast., Disp: 30 tablet, Rfl: 12 .  KRILL OIL OMEGA-3 PO, Take  by mouth., Disp: , Rfl:  .  magnesium oxide (MAG-OX) 400 MG tablet, Take 400 mg by mouth daily., Disp: , Rfl:  .  Multiple Vitamin (MULTIVITAMIN) tablet, Take 1 tablet by mouth daily., Disp: , Rfl:  .  ondansetron (ZOFRAN ODT) 4 MG disintegrating tablet, Take 1 tablet (4 mg total) by mouth every 8 (eight) hours as needed for nausea or vomiting., Disp: 20 tablet, Rfl: 3 .  OVER THE COUNTER MEDICATION, Zquil for sleep., Disp: , Rfl:  .  Riboflavin 100 MG CAPS, Take 100 mg by mouth daily., Disp: , Rfl:  .  rizatriptan (MAXALT-MLT) 10 MG disintegrating tablet, Take 1 tablet (10 mg total) by mouth as needed. May repeat in 2 hours if needed, Disp: 36 tablet, Rfl: 4 .  SUMAtriptan (IMITREX STATDOSE SYSTEM) 6 MG/0.5ML SOAJ, Inject 6 mg into the skin as needed., Disp: 10 cartridge, Rfl: 11 .  SYNTHROID 100 MCG tablet, TK 1 T PO D, Disp: , Rfl: 2  Allergies:  Allergies  Allergen Reactions  . Penicillins Rash    Past Medical History, Surgical history, Social history, and Family History were reviewed and updated.  Review of Systems: As above  Physical Exam:  weight is 202 lb 1.9 oz (91.7 kg). Her oral temperature is 98.4 F (36.9 C). Her blood pressure is 117/71 and her pulse is 84.  Her respiration is 20.   Wt Readings from Last 3 Encounters:  05/30/16 202 lb 1.9 oz (91.7 kg)  03/13/16 195 lb 12 oz (88.8 kg)  02/25/16 198 lb 1.9 oz (89.9 kg)     Well-developed well-nourished white female in no obvious distress. Head and neck exam shows no ocular or oral lesions. There are no palpable cervical or supraclavicular lymph nodes. Lungs are clear. Cardiac exam regular rate and rhythm with no murmurs, rubs or bruits. Abdomen is soft. She has good bowel sounds. There is no fluid wave. There may be some slight tenderness in the left lower quadrant to palpation. No fluid wave is noted. There is no palpable liver or spleen tip. Breast exam shows right breast with no masses, edema or erythema. There is no  right axillary adenopathy. Left breast shows a well-healed lumpectomy at the 1:00 position. She has no firmness at the lumpectomy site. She has no distinct mass in the left breast. There is no swelling about the left nipple. There is no redness associated with the [name]. I see no rash. There is no left axillary adenopathy. Extremities shows no clubbing, cyanosis or edema. She has good range of motion of his joints. Back exam shows no tenderness over the spine, ribs or hips. Skin exam shows no rashes, ecchymoses or petechia. Neurological exam shows no focal neurological deficits.  Lab Results  Component Value Date   WBC 6.0 05/30/2016   HGB 12.8 05/30/2016   HCT 38.5 05/30/2016   MCV 87 05/30/2016   PLT 325 05/30/2016     Chemistry      Component Value Date/Time   NA 133 (L) 02/25/2016 1430   NA 139 10/24/2015 1319   K 3.5 02/25/2016 1430   K 3.7 10/24/2015 1319   CL 101 02/25/2016 1430   CL 102 12/11/2014 1318   CO2 25 02/25/2016 1430   CO2 27 10/24/2015 1319   BUN 9 02/25/2016 1430   BUN 7.9 10/24/2015 1319   CREATININE 0.64 02/25/2016 1430   CREATININE 0.7 10/24/2015 1319      Component Value Date/Time   CALCIUM 9.1 02/25/2016 1430   CALCIUM 9.3 10/24/2015 1319   ALKPHOS 78 02/25/2016 1430   ALKPHOS 53 10/24/2015 1319   AST 15 02/25/2016 1430   AST 17 10/24/2015 1319   ALT 17 02/25/2016 1430   ALT 14 10/24/2015 1319   BILITOT 0.3 02/25/2016 1430   BILITOT 0.32 10/24/2015 1319         Impression and Plan: Ms. Budzynski is 50 year old white female. Overall, condition is doing okay. She is postmenopausal.  I do not see any problems with her having her hysterectomy. I told her that this clearly would be advantageous for her with respect to any kind of breast cancer recurrence. Again, she does not have breast cancer. She is at higher risk for breast cancer because  lobular carcinoma in situ.   I will plan to see her back in 3 months to make sure that everything is going  okay.   [name], MD 11/3/201710:19 AM

## 2016-06-02 NOTE — H&P (Signed)
Patient name [name], [name] DICTATION# [id number] CSN# [id number]  Darlyn Chamber, MD 06/02/2016 2:12 PM

## 2016-06-03 NOTE — H&P (Signed)
[name], PHANN                 ACCOUNT NO.:  [id number]  MEDICAL RECORD NO.:  [id number]  LOCATION:                                 FACILITY:  PHYSICIAN:  Darlyn Chamber, M.D.        DATE OF BIRTH:  DATE OF ADMISSION: DATE OF DISCHARGE:                             HISTORY & PHYSICAL   The date of her surgery is June 17, 2016, this is going to be done at [facility] at [place]:  The patient is a 50 year old nulligravida female, presents for laparoscopic-assisted vaginal hysterectomy with bilateral salpingo-oophorectomy.  She has had trouble with worsening perimenopausal bleeding, cycles vary from 4 days to 2 weeks, heavy with clots and dysmenorrhea.  Her hemoglobin is remained stable.  In December of last year, she underwent hysteroscopic resection for benign endometrial polyps.  She continued to have abnormal bleeding.  Another saline infusion ultrasound revealed a retained clot, this was removed through suction and all pathology was benign.  However, she continues to have abnormal uterine bleeding.  The patient does have a history of lobular carcinoma in situ.  She underwent a lumpectomy and is presently on aromatase inhibitor.  In view of the continued abnormal bleeding, we discussed various options.  She now wants to proceed with laparoscopic- assisted vaginal hysterectomy with removal of both tubes and ovaries. Risks of surgery have been discussed.  ALLERGIES:  She is allergic to PENICILLIN.  MEDICATIONS:  She is on Synthroid 100 mcg daily.  She uses Imitrex as needed for headaches.  She uses Maxalt as needed for headaches.  She is on Zofran.  She is on Aromasin 25 mg daily.  Also on some over-the- counter medications.  PAST MEDICAL HISTORY:  She has had a history of breast cancer as noted above.  She also has a history of chronic diarrhea.  History of anemia. Previous history of hyperlipidemia.  Hypothyroidism and  migraine headaches.  PAST SURGICAL HISTORY:  She had a lumpectomy of the breast.  SOCIAL HISTORY:  Reveals no tobacco or alcohol use.  FAMILY HISTORY:  Noncontributory.  PHYSICAL EXAMINATION:  VITAL SIGNS:  The patient is afebrile with stable vital signs.  HEENT:  The patient is normocephalic.  Pupils are equal, round, reactive to light and accommodation.  Extraocular movements are intact.  Sclerae and conjunctivae are clear.  Oropharynx clear. NECK:  Without thyromegaly. BREASTS:  Not examined. LUNGS:  Clear. CARDIOVASCULAR:  Regular rate.  No murmurs or gallops.  No carotid or abdominal bruits. ABDOMINAL:  Benign. PELVIC:  Normal external genitalia.  Vaginal mucosa is clear.  Cervix unremarkable.  Uterus normal size, shape, and contour.  Adnexa, free of masses or tenderness. EXTREMITIES:  Trace edema. NEUROLOGIC:  Grossly within normal limits.  IMPRESSION: 1. Persistent perimenopausal bleeding, unresponsive to conservative     management. 2. Personal history of breast cancer with previous lumpectomy. 3. Hypothyroidism.  PLAN:  The patient to undergo laparoscopic-assisted vaginal hysterectomy with removal of both tubes and ovary.  The risks of surgery have been discussed including the risk of infection.  Risk of hemorrhage that could require transfusion with the risk  of AIDS or hepatitis.  Risk of injury to adjacent organs including bladder, bowel, ureters that could require further exploratory surgery.  Risk of deep venous thrombosis and pulmonary embolus.  We are going to do cystoscopy after the procedure to rule out any ureter injuries.  The patient expressed understanding of indications, risks and alternatives.     Darlyn Chamber, M.D.     JSM/MEDQ  D:  06/02/2016  T:  06/03/2016  Job:  [id number]

## 2016-06-06 ENCOUNTER — Ambulatory Visit
Admission: RE | Admit: 2016-06-06 | Discharge: 2016-06-06 | Disposition: A | Discharge status: Home / Self Care | Payer: BC Managed Care – PPO | Source: Ambulatory Visit | Attending: Hematology & Oncology | Admitting: Hematology & Oncology

## 2016-06-06 DIAGNOSIS — Z1231 Encounter for screening mammogram for malignant neoplasm of breast: Secondary | ICD-10-CM

## 2016-06-10 ENCOUNTER — Encounter (HOSPITAL_BASED_OUTPATIENT_CLINIC_OR_DEPARTMENT_OTHER): Payer: Self-pay | Admitting: *Deleted

## 2016-06-11 ENCOUNTER — Encounter (HOSPITAL_BASED_OUTPATIENT_CLINIC_OR_DEPARTMENT_OTHER): Payer: Self-pay | Admitting: *Deleted

## 2016-06-11 ENCOUNTER — Other Ambulatory Visit: Payer: Self-pay | Admitting: Obstetrics and Gynecology

## 2016-06-11 DIAGNOSIS — Z853 Personal history of malignant neoplasm of breast: Secondary | ICD-10-CM

## 2016-06-11 DIAGNOSIS — Z803 Family history of malignant neoplasm of breast: Secondary | ICD-10-CM

## 2016-06-11 NOTE — Progress Notes (Addendum)
NPO AFTER MN.  ARRIVE AT 0600.  NEEDS ISTAT 8 AND T &S. (GENT. ORDERED),  OTHER CURRENT LABS IN CHART AND EPIC, CBC AND CMET.  WILL TAKE AROMASIN AND SYNTHROID AM DOS W/ SIPS OF WATER.  SPOKE W/ DR [place] PHONE,  OK TO USE CBC DONE 05-30-2016

## 2016-06-16 NOTE — Anesthesia Preprocedure Evaluation (Addendum)
Anesthesia Evaluation  Patient identified by MRN, date of birth, ID band Patient awake    Reviewed: Allergy & Precautions, NPO status , Patient's Chart, lab work & pertinent test results  History of Anesthesia Complications (+) history of anesthetic complications ("post-op panic attack; woke up agitated")  Airway Mallampati: III  TM Distance: <3 FB Neck ROM: Full    Dental  (+) Teeth Intact, Dental Advisory Given   Pulmonary neg pulmonary ROS,    Pulmonary exam normal breath sounds clear to auscultation       Cardiovascular Exercise Tolerance: Good negative cardio ROS Normal cardiovascular exam Rhythm:Regular Rate:Normal  Echo 03/21/14: Study Conclusions  - Left ventricle: The cavity size was normal. Systolic function wasnormal. The estimated ejection fraction was in the range of 60%to 65%. Wall motion was normal; there were no regional wallmotion abnormalities. There was an increased relativecontribution of atrial contraction to ventricular filling.Doppler parameters are consistent with abnormal left ventricularrelaxation (grade 1 diastolic dysfunction). - Tricuspid valve: There was trivial regurgitation. - Pulmonic valve: There was mild regurgitation.   Neuro/Psych  Headaches, negative psych ROS   GI/Hepatic negative GI ROS, Neg liver ROS,   Endo/Other  Hypothyroidism Obesity   Renal/GU negative Renal ROS     Musculoskeletal negative musculoskeletal ROS (+)   Abdominal   Peds  Hematology negative hematology ROS (+)   Anesthesia Other Findings Day of surgery medications reviewed with the patient.  Reproductive/Obstetrics Left breast cancer s/p lumpectomy Perimenopausal bleeding                             Anesthesia Physical Anesthesia Plan  ASA: II  Anesthesia Plan: General   Post-op Pain Management:    Induction: Intravenous  Airway Management Planned: Oral  ETT  Additional Equipment:   Intra-op Plan:   Post-operative Plan: Extubation in OR  Informed Consent: I have reviewed the patients History and Physical, chart, labs and discussed the procedure including the risks, benefits and alternatives for the proposed anesthesia with the patient or authorized representative who has indicated his/her understanding and acceptance.   Dental advisory given  Plan Discussed with: CRNA  Anesthesia Plan Comments: (Risks/benefits of general anesthesia discussed with patient including risk of damage to teeth, lips, gum, and tongue, nausea/vomiting, allergic reactions to medications, and the possibility of heart attack, stroke and death.  All patient questions answered.  Patient wishes to proceed.  2nd IV after induction.)       Anesthesia Quick Evaluation

## 2016-06-17 ENCOUNTER — Encounter (HOSPITAL_COMMUNITY)
Admission: RE | Disposition: A | Discharge status: Home / Self Care | Payer: Self-pay | Source: Ambulatory Visit | Attending: Obstetrics and Gynecology

## 2016-06-17 ENCOUNTER — Observation Stay (HOSPITAL_BASED_OUTPATIENT_CLINIC_OR_DEPARTMENT_OTHER)
Admission: RE | Admit: 2016-06-17 | Discharge: 2016-06-18 | Disposition: A | Discharge status: Home / Self Care | Payer: BC Managed Care – PPO | Source: Ambulatory Visit | Attending: Obstetrics and Gynecology | Admitting: Obstetrics and Gynecology

## 2016-06-17 ENCOUNTER — Ambulatory Visit (HOSPITAL_BASED_OUTPATIENT_CLINIC_OR_DEPARTMENT_OTHER): Payer: BC Managed Care – PPO | Admitting: Anesthesiology

## 2016-06-17 ENCOUNTER — Encounter (HOSPITAL_BASED_OUTPATIENT_CLINIC_OR_DEPARTMENT_OTHER): Payer: Self-pay

## 2016-06-17 DIAGNOSIS — N841 Polyp of cervix uteri: Secondary | ICD-10-CM | POA: Insufficient documentation

## 2016-06-17 DIAGNOSIS — N939 Abnormal uterine and vaginal bleeding, unspecified: Principal | ICD-10-CM | POA: Insufficient documentation

## 2016-06-17 DIAGNOSIS — D259 Leiomyoma of uterus, unspecified: Secondary | ICD-10-CM | POA: Diagnosis not present

## 2016-06-17 DIAGNOSIS — D270 Benign neoplasm of right ovary: Secondary | ICD-10-CM | POA: Diagnosis not present

## 2016-06-17 DIAGNOSIS — Z79811 Long term (current) use of aromatase inhibitors: Secondary | ICD-10-CM | POA: Insufficient documentation

## 2016-06-17 DIAGNOSIS — Z6831 Body mass index (BMI) 31.0-31.9, adult: Secondary | ICD-10-CM | POA: Insufficient documentation

## 2016-06-17 DIAGNOSIS — D271 Benign neoplasm of left ovary: Secondary | ICD-10-CM | POA: Insufficient documentation

## 2016-06-17 DIAGNOSIS — E669 Obesity, unspecified: Secondary | ICD-10-CM | POA: Diagnosis not present

## 2016-06-17 DIAGNOSIS — N950 Postmenopausal bleeding: Secondary | ICD-10-CM | POA: Insufficient documentation

## 2016-06-17 DIAGNOSIS — N946 Dysmenorrhea, unspecified: Secondary | ICD-10-CM | POA: Insufficient documentation

## 2016-06-17 DIAGNOSIS — Z86000 Personal history of in-situ neoplasm of breast: Secondary | ICD-10-CM | POA: Insufficient documentation

## 2016-06-17 DIAGNOSIS — E039 Hypothyroidism, unspecified: Secondary | ICD-10-CM | POA: Insufficient documentation

## 2016-06-17 DIAGNOSIS — G43909 Migraine, unspecified, not intractable, without status migrainosus: Secondary | ICD-10-CM | POA: Diagnosis not present

## 2016-06-17 DIAGNOSIS — Z90710 Acquired absence of both cervix and uterus: Secondary | ICD-10-CM | POA: Diagnosis present

## 2016-06-17 DIAGNOSIS — N838 Other noninflammatory disorders of ovary, fallopian tube and broad ligament: Secondary | ICD-10-CM | POA: Insufficient documentation

## 2016-06-17 DIAGNOSIS — Z79899 Other long term (current) drug therapy: Secondary | ICD-10-CM | POA: Insufficient documentation

## 2016-06-17 HISTORY — PX: LAPAROSCOPIC VAGINAL HYSTERECTOMY WITH SALPINGO OOPHORECTOMY: SHX6681

## 2016-06-17 LAB — POCT I-STAT, CHEM 8
BUN: 5 mg/dL — ABNORMAL LOW (ref 6–20)
CALCIUM ION: 1.22 mmol/L (ref 1.15–1.40)
CHLORIDE: 104 mmol/L (ref 101–111)
Creatinine, Ser: 0.6 mg/dL (ref 0.44–1.00)
GLUCOSE: 92 mg/dL (ref 65–99)
HCT: 39 % (ref 36.0–46.0)
HEMOGLOBIN: 13.3 g/dL (ref 12.0–15.0)
Potassium: 3.7 mmol/L (ref 3.5–5.1)
SODIUM: 141 mmol/L (ref 135–145)
TCO2: 22 mmol/L (ref 0–100)

## 2016-06-17 LAB — ABO/RH: ABO/RH(D): O POS

## 2016-06-17 LAB — TYPE AND SCREEN
ABO/RH(D): O POS
ANTIBODY SCREEN: NEGATIVE

## 2016-06-17 SURGERY — HYSTERECTOMY, VAGINAL, LAPAROSCOPY-ASSISTED, WITH SALPINGO-OOPHORECTOMY
Anesthesia: General | Site: Abdomen | Laterality: Bilateral

## 2016-06-17 MED ORDER — PROPOFOL 10 MG/ML IV BOLUS
INTRAVENOUS | Status: DC | PRN
  Administered 2016-06-17: 180 mg via INTRAVENOUS

## 2016-06-17 MED ORDER — GENTAMICIN SULFATE 40 MG/ML IJ SOLN
INTRAVENOUS | Status: DC
  Filled 2016-06-17: qty 11.5

## 2016-06-17 MED ORDER — FLUORESCEIN SODIUM 10 % IV SOLN
INTRAVENOUS | Status: AC
  Filled 2016-06-17: qty 5

## 2016-06-17 MED ORDER — SUGAMMADEX SODIUM 200 MG/2ML IV SOLN
INTRAVENOUS | Status: AC
  Filled 2016-06-17: qty 2

## 2016-06-17 MED ORDER — SUCCINYLCHOLINE CHLORIDE 200 MG/10ML IV SOSY
PREFILLED_SYRINGE | INTRAVENOUS | Status: DC | PRN
  Administered 2016-06-17: 120 mg via INTRAVENOUS

## 2016-06-17 MED ORDER — DEXAMETHASONE SODIUM PHOSPHATE 10 MG/ML IJ SOLN
INTRAMUSCULAR | Status: AC
  Filled 2016-06-17: qty 1

## 2016-06-17 MED ORDER — ACETAMINOPHEN 500 MG PO TABS
ORAL_TABLET | ORAL | Status: AC
  Filled 2016-06-17: qty 2

## 2016-06-17 MED ORDER — PROMETHAZINE HCL 25 MG/ML IJ SOLN
6.2500 mg | INTRAMUSCULAR | Status: DC | PRN
  Administered 2016-06-17: 6.25 mg via INTRAVENOUS
  Filled 2016-06-17: qty 1

## 2016-06-17 MED ORDER — DEXAMETHASONE SODIUM PHOSPHATE 4 MG/ML IJ SOLN
INTRAMUSCULAR | Status: DC | PRN
  Administered 2016-06-17: 10 mg via INTRAVENOUS

## 2016-06-17 MED ORDER — SODIUM CHLORIDE 0.9 % IR SOLN
Status: DC | PRN
Start: 2016-06-17 — End: 2016-06-17
  Administered 2016-06-17: 1000 mL

## 2016-06-17 MED ORDER — PROPOFOL 10 MG/ML IV BOLUS
INTRAVENOUS | Status: AC
  Filled 2016-06-17: qty 20

## 2016-06-17 MED ORDER — ONDANSETRON HCL 4 MG/2ML IJ SOLN
INTRAMUSCULAR | Status: AC
  Filled 2016-06-17: qty 2

## 2016-06-17 MED ORDER — LACTATED RINGERS IV SOLN
INTRAVENOUS | Status: DC
  Administered 2016-06-17 (×2): via INTRAVENOUS
  Filled 2016-06-17: qty 1000

## 2016-06-17 MED ORDER — MIDAZOLAM HCL 5 MG/5ML IJ SOLN
INTRAMUSCULAR | Status: DC | PRN
  Administered 2016-06-17: 2 mg via INTRAVENOUS

## 2016-06-17 MED ORDER — DIPHENHYDRAMINE HCL 12.5 MG/5ML PO ELIX: 12.5000 mg | ORAL_SOLUTION | Freq: Four times a day (QID) | ORAL | Status: DC | PRN

## 2016-06-17 MED ORDER — LIDOCAINE 2% (20 MG/ML) 5 ML SYRINGE
INTRAMUSCULAR | Status: AC
  Filled 2016-06-17: qty 5

## 2016-06-17 MED ORDER — ONDANSETRON HCL 4 MG/2ML IJ SOLN
INTRAMUSCULAR | Status: DC | PRN
  Administered 2016-06-17: 4 mg via INTRAVENOUS

## 2016-06-17 MED ORDER — SCOPOLAMINE 1 MG/3DAYS TD PT72
1.0000 | MEDICATED_PATCH | Freq: Once | TRANSDERMAL | Status: DC
  Administered 2016-06-17: 1.5 mg via TRANSDERMAL
  Filled 2016-06-17: qty 1

## 2016-06-17 MED ORDER — PROMETHAZINE HCL 25 MG/ML IJ SOLN
INTRAMUSCULAR | Status: AC
  Filled 2016-06-17: qty 1

## 2016-06-17 MED ORDER — GABAPENTIN 300 MG PO CAPS
300.0000 mg | ORAL_CAPSULE | Freq: Once | ORAL | Status: AC
  Administered 2016-06-17: 300 mg via ORAL
  Filled 2016-06-17: qty 1

## 2016-06-17 MED ORDER — EPHEDRINE 5 MG/ML INJ
INTRAVENOUS | Status: AC
  Filled 2016-06-17: qty 10

## 2016-06-17 MED ORDER — CLINDAMYCIN PHOSPHATE 900 MG/50ML IV SOLN
INTRAVENOUS | Status: AC
  Filled 2016-06-17: qty 50

## 2016-06-17 MED ORDER — SODIUM CHLORIDE 0.9% FLUSH: 9.0000 mL | INTRAVENOUS | Status: DC | PRN

## 2016-06-17 MED ORDER — ONDANSETRON HCL 4 MG PO TABS: 4.0000 mg | ORAL_TABLET | Freq: Four times a day (QID) | ORAL | Status: DC | PRN

## 2016-06-17 MED ORDER — ONDANSETRON HCL 4 MG/2ML IJ SOLN
4.0000 mg | Freq: Four times a day (QID) | INTRAMUSCULAR | Status: DC | PRN
  Administered 2016-06-17: 4 mg via INTRAVENOUS

## 2016-06-17 MED ORDER — CELECOXIB 200 MG PO CAPS
200.0000 mg | ORAL_CAPSULE | Freq: Once | ORAL | Status: AC
  Administered 2016-06-17: 200 mg via ORAL
  Filled 2016-06-17: qty 1

## 2016-06-17 MED ORDER — FENTANYL CITRATE (PF) 100 MCG/2ML IJ SOLN
INTRAMUSCULAR | Status: AC
  Filled 2016-06-17: qty 2

## 2016-06-17 MED ORDER — GABAPENTIN 300 MG PO CAPS
ORAL_CAPSULE | ORAL | Status: AC
  Filled 2016-06-17: qty 1

## 2016-06-17 MED ORDER — RIZATRIPTAN BENZOATE 10 MG PO TBDP: 10.0000 mg | ORAL_TABLET | Freq: Two times a day (BID) | ORAL | Status: DC | PRN

## 2016-06-17 MED ORDER — HYDROMORPHONE HCL 1 MG/ML IJ SOLN
0.2500 mg | INTRAMUSCULAR | Status: DC | PRN
  Administered 2016-06-17 (×2): 0.25 mg via INTRAVENOUS
  Filled 2016-06-17: qty 0.5

## 2016-06-17 MED ORDER — OXYCODONE-ACETAMINOPHEN 5-325 MG PO TABS: 1.0000 | ORAL_TABLET | ORAL | Status: DC | PRN

## 2016-06-17 MED ORDER — MIDAZOLAM HCL 2 MG/2ML IJ SOLN
INTRAMUSCULAR | Status: AC
  Filled 2016-06-17: qty 2

## 2016-06-17 MED ORDER — EPHEDRINE SULFATE 50 MG/ML IJ SOLN
INTRAMUSCULAR | Status: DC | PRN
  Administered 2016-06-17: 10 mg via INTRAVENOUS
  Administered 2016-06-17: 5 mg via INTRAVENOUS

## 2016-06-17 MED ORDER — SUCCINYLCHOLINE CHLORIDE 200 MG/10ML IV SOSY
PREFILLED_SYRINGE | INTRAVENOUS | Status: AC
  Filled 2016-06-17: qty 10

## 2016-06-17 MED ORDER — ROCURONIUM BROMIDE 50 MG/5ML IV SOSY
PREFILLED_SYRINGE | INTRAVENOUS | Status: DC | PRN
  Administered 2016-06-17: 15 mg via INTRAVENOUS
  Administered 2016-06-17: 10 mg via INTRAVENOUS
  Administered 2016-06-17: 20 mg via INTRAVENOUS
  Administered 2016-06-17: 5 mg via INTRAVENOUS

## 2016-06-17 MED ORDER — HYDROMORPHONE HCL 1 MG/ML IJ SOLN
INTRAMUSCULAR | Status: AC
  Filled 2016-06-17: qty 1

## 2016-06-17 MED ORDER — SIMETHICONE 80 MG PO CHEW
80.0000 mg | CHEWABLE_TABLET | Freq: Four times a day (QID) | ORAL | Status: DC | PRN
  Administered 2016-06-17 – 2016-06-18 (×2): 80 mg via ORAL
  Filled 2016-06-17 (×2): qty 1

## 2016-06-17 MED ORDER — NALOXONE HCL 0.4 MG/ML IJ SOLN: 0.4000 mg | INTRAMUSCULAR | Status: DC | PRN

## 2016-06-17 MED ORDER — ACETAMINOPHEN 325 MG PO TABS: 650.0000 mg | ORAL_TABLET | ORAL | Status: DC | PRN

## 2016-06-17 MED ORDER — SUGAMMADEX SODIUM 200 MG/2ML IV SOLN
INTRAVENOUS | Status: DC | PRN
  Administered 2016-06-17: 200 mg via INTRAVENOUS

## 2016-06-17 MED ORDER — GENTAMICIN SULFATE 40 MG/ML IJ SOLN
INTRAVENOUS | Status: AC
  Administered 2016-06-17: 100 mL via INTRAVENOUS
  Filled 2016-06-17: qty 9

## 2016-06-17 MED ORDER — SCOPOLAMINE 1 MG/3DAYS TD PT72
MEDICATED_PATCH | TRANSDERMAL | Status: AC
Start: 2016-06-17 — End: 2016-06-17
  Filled 2016-06-17: qty 1

## 2016-06-17 MED ORDER — LIDOCAINE HCL (CARDIAC) 20 MG/ML IV SOLN
INTRAVENOUS | Status: DC | PRN
  Administered 2016-06-17: 60 mg via INTRAVENOUS

## 2016-06-17 MED ORDER — SUCCINYLCHOLINE CHLORIDE 20 MG/ML IJ SOLN
INTRAMUSCULAR | Status: DC | PRN
  Administered 2016-06-17: 120 mg via INTRAVENOUS

## 2016-06-17 MED ORDER — MENTHOL 3 MG MT LOZG
1.0000 | LOZENGE | OROMUCOSAL | Status: DC | PRN
  Filled 2016-06-17: qty 9

## 2016-06-17 MED ORDER — HYDROMORPHONE 1 MG/ML IV SOLN
INTRAVENOUS | Status: DC
  Administered 2016-06-17: 14:00:00 via INTRAVENOUS
  Administered 2016-06-17: 0.2 mg via INTRAVENOUS
  Administered 2016-06-18 (×3): 0 mg via INTRAVENOUS
  Filled 2016-06-17: qty 25

## 2016-06-17 MED ORDER — ACETAMINOPHEN 500 MG PO TABS
1000.0000 mg | ORAL_TABLET | Freq: Once | ORAL | Status: AC
  Administered 2016-06-17: 1000 mg via ORAL
  Filled 2016-06-17: qty 2

## 2016-06-17 MED ORDER — FENTANYL CITRATE (PF) 100 MCG/2ML IJ SOLN
25.0000 ug | INTRAMUSCULAR | Status: DC | PRN
  Administered 2016-06-17 (×2): 25 ug via INTRAVENOUS
  Administered 2016-06-17: 50 ug via INTRAVENOUS
  Administered 2016-06-17 (×2): 25 ug via INTRAVENOUS
  Filled 2016-06-17: qty 1

## 2016-06-17 MED ORDER — ROCURONIUM BROMIDE 50 MG/5ML IV SOSY
PREFILLED_SYRINGE | INTRAVENOUS | Status: AC
  Filled 2016-06-17: qty 5

## 2016-06-17 MED ORDER — DIPHENHYDRAMINE HCL 50 MG/ML IJ SOLN: 12.5000 mg | Freq: Four times a day (QID) | INTRAMUSCULAR | Status: DC | PRN

## 2016-06-17 MED ORDER — BUPIVACAINE HCL (PF) 0.25 % IJ SOLN
INTRAMUSCULAR | Status: DC | PRN
  Administered 2016-06-17: 9 mL

## 2016-06-17 MED ORDER — LACTATED RINGERS IV SOLN
INTRAVENOUS | Status: DC
  Administered 2016-06-17: 11:00:00 via INTRAVENOUS
  Filled 2016-06-17: qty 1000

## 2016-06-17 MED ORDER — SUMATRIPTAN SUCCINATE 100 MG PO TABS
100.0000 mg | ORAL_TABLET | ORAL | Status: DC | PRN
  Administered 2016-06-18: 100 mg via ORAL
  Filled 2016-06-17 (×3): qty 1

## 2016-06-17 MED ORDER — KETOROLAC TROMETHAMINE 30 MG/ML IJ SOLN
INTRAMUSCULAR | Status: DC | PRN
  Administered 2016-06-17: 30 mg via INTRAVENOUS

## 2016-06-17 MED ORDER — STERILE WATER FOR IRRIGATION IR SOLN
Status: DC | PRN
  Administered 2016-06-17: 3000 mL

## 2016-06-17 MED ORDER — LEVOTHYROXINE SODIUM 100 MCG PO TABS
100.0000 ug | ORAL_TABLET | Freq: Every day | ORAL | Status: AC
  Administered 2016-06-18: 100 ug via ORAL
  Filled 2016-06-17: qty 1

## 2016-06-17 MED ORDER — ONDANSETRON HCL 4 MG/2ML IJ SOLN
4.0000 mg | Freq: Four times a day (QID) | INTRAMUSCULAR | Status: DC | PRN
  Filled 2016-06-17: qty 2

## 2016-06-17 MED ORDER — KETOROLAC TROMETHAMINE 30 MG/ML IJ SOLN
INTRAMUSCULAR | Status: AC
  Filled 2016-06-17: qty 1

## 2016-06-17 MED ORDER — CELECOXIB 200 MG PO CAPS
ORAL_CAPSULE | ORAL | Status: AC
  Filled 2016-06-17: qty 1

## 2016-06-17 MED ORDER — FENTANYL CITRATE (PF) 100 MCG/2ML IJ SOLN
INTRAMUSCULAR | Status: DC | PRN
  Administered 2016-06-17 (×6): 25 ug via INTRAVENOUS
  Administered 2016-06-17: 50 ug via INTRAVENOUS

## 2016-06-17 SURGICAL SUPPLY — 88 items
APPLICATOR COTTON TIP 6IN STRL (MISCELLANEOUS) ×3 IMPLANT
BAG SPEC RTRVL LRG 6X4 10 (ENDOMECHANICALS)
BANDAGE ADH SHEER 1  50/CT (GAUZE/BANDAGES/DRESSINGS) ×3 IMPLANT
BANDAGE ADHESIVE 1X3 (GAUZE/BANDAGES/DRESSINGS) IMPLANT
BLADE CLIPPER SURG (BLADE) ×1 IMPLANT
BLADE SURG 10 STRL SS (BLADE) ×3 IMPLANT
BLADE SURG 11 STRL SS (BLADE) ×3 IMPLANT
CANISTER SUCTION 2500CC (MISCELLANEOUS) ×6 IMPLANT
CATH ROBINSON RED A/P 16FR (CATHETERS) ×1 IMPLANT
CLOSURE WOUND 1/4X4 (GAUZE/BANDAGES/DRESSINGS)
COVER BACK TABLE 60X90IN (DRAPES) ×6 IMPLANT
COVER MAYO STAND STRL (DRAPES) ×6 IMPLANT
DRAPE LG THREE QUARTER DISP (DRAPES) ×3 IMPLANT
DRAPE UNDERBUTTOCKS STRL (DRAPE) ×3 IMPLANT
DRSG COVADERM PLUS 2X2 (GAUZE/BANDAGES/DRESSINGS) ×2 IMPLANT
ELECT REM PT RETURN 9FT ADLT (ELECTROSURGICAL) ×3
ELECTRODE REM PT RTRN 9FT ADLT (ELECTROSURGICAL) ×1 IMPLANT
FILTER SMOKE EVAC LAPAROSHD (FILTER) IMPLANT
GLOVE BIO SURGEON STRL SZ7 (GLOVE) ×12 IMPLANT
GLOVE BIO SURGEON STRL SZ7.5 (GLOVE) ×2 IMPLANT
GLOVE BIOGEL PI IND STRL 6.5 (GLOVE) ×1 IMPLANT
GLOVE BIOGEL PI IND STRL 7.0 (GLOVE) IMPLANT
GLOVE BIOGEL PI IND STRL 7.5 (GLOVE) IMPLANT
GLOVE BIOGEL PI INDICATOR 6.5 (GLOVE) ×2
GLOVE BIOGEL PI INDICATOR 7.0 (GLOVE) ×2
GLOVE BIOGEL PI INDICATOR 7.5 (GLOVE) ×4
GLOVE ECLIPSE 7.0 STRL STRAW (GLOVE) ×2 IMPLANT
GOWN STRL REUS W/ TWL LRG LVL3 (GOWN DISPOSABLE) ×1 IMPLANT
GOWN STRL REUS W/ TWL XL LVL3 (GOWN DISPOSABLE) ×1 IMPLANT
GOWN STRL REUS W/TWL LRG LVL3 (GOWN DISPOSABLE) ×7 IMPLANT
GOWN STRL REUS W/TWL XL LVL3 (GOWN DISPOSABLE) ×6
HOLDER FOLEY CATH W/STRAP (MISCELLANEOUS) ×3 IMPLANT
KIT ROOM TURNOVER WOR (KITS) ×3 IMPLANT
LIQUID BAND (GAUZE/BANDAGES/DRESSINGS) ×3 IMPLANT
NDL INSUFFLATION 14GA 120MM (NEEDLE) IMPLANT
NDL INSUFFLATION 14GA 150MM (NEEDLE) IMPLANT
NEEDLE HYPO 22GX1.5 SAFETY (NEEDLE) ×3 IMPLANT
NEEDLE INSUFFLATION 14GA 120MM (NEEDLE) ×3 IMPLANT
NEEDLE INSUFFLATION 14GA 150MM (NEEDLE) IMPLANT
NS IRRIG 500ML POUR BTL (IV SOLUTION) ×3 IMPLANT
PACK BASIN DAY SURGERY FS (CUSTOM PROCEDURE TRAY) ×3 IMPLANT
PAD OB MATERNITY 4.3X12.25 (PERSONAL CARE ITEMS) ×3 IMPLANT
PAD PREP 24X48 CUFFED NSTRL (MISCELLANEOUS) ×3 IMPLANT
PADDING ION DISPOSABLE (MISCELLANEOUS) ×3 IMPLANT
PENCIL BUTTON HOLSTER BLD 10FT (ELECTRODE) ×3 IMPLANT
POUCH SPECIMEN RETRIEVAL 10MM (ENDOMECHANICALS) IMPLANT
SCISSORS LAP 5X35 DISP (ENDOMECHANICALS) IMPLANT
SCISSORS LAP 5X45 EPIX DISP (ENDOMECHANICALS) IMPLANT
SEALER TISSUE G2 CVD JAW 45CM (ENDOMECHANICALS) ×3 IMPLANT
SET IRRIG TUBING LAPAROSCOPIC (IRRIGATION / IRRIGATOR) ×3 IMPLANT
SET IRRIG Y TYPE TUR BLADDER L (SET/KITS/TRAYS/PACK) IMPLANT
SHEET LAVH (DRAPES) ×3 IMPLANT
SOLUTION ANTI FOG 6CC (MISCELLANEOUS) ×3 IMPLANT
SOLUTION ELECTROLUBE (MISCELLANEOUS) IMPLANT
SPONGE GAUZE 2X2 8PLY STER LF (GAUZE/BANDAGES/DRESSINGS)
SPONGE GAUZE 2X2 8PLY STRL LF (GAUZE/BANDAGES/DRESSINGS) IMPLANT
SPONGE LAP 4X18 X RAY DECT (DISPOSABLE) ×3 IMPLANT
STRIP CLOSURE SKIN 1/4X4 (GAUZE/BANDAGES/DRESSINGS) IMPLANT
SUT MNCRL AB 4-0 PS2 18 (SUTURE) ×3 IMPLANT
SUT MON AB 2-0 CT1 36 (SUTURE) ×3 IMPLANT
SUT VIC AB 0 CT1 18XCR BRD8 (SUTURE) ×2 IMPLANT
SUT VIC AB 0 CT1 36 (SUTURE) ×6 IMPLANT
SUT VIC AB 0 CT1 8-18 (SUTURE) ×9
SUT VIC AB 2-0 CT1 (SUTURE) IMPLANT
SUT VIC AB 2-0 SH 27 (SUTURE)
SUT VIC AB 2-0 SH 27XBRD (SUTURE) IMPLANT
SUT VIC AB 3-0 SH 27 (SUTURE)
SUT VIC AB 3-0 SH 27X BRD (SUTURE) IMPLANT
SUT VICRYL 0 UR6 27IN ABS (SUTURE) IMPLANT
SUT VICRYL 1 TIES 12X18 (SUTURE) ×3 IMPLANT
SUT VICRYL 4-0 PS2 18IN ABS (SUTURE) ×3 IMPLANT
SYR BULB IRRIGATION 50ML (SYRINGE) ×3 IMPLANT
SYR CONTROL 10ML LL (SYRINGE) ×3 IMPLANT
SYRINGE 10CC LL (SYRINGE) ×3 IMPLANT
TOWEL OR 17X24 6PK STRL BLUE (TOWEL DISPOSABLE) ×6 IMPLANT
TRAY DSU PREP LF (CUSTOM PROCEDURE TRAY) ×3 IMPLANT
TRAY FOLEY CATH SILVER 14FR (SET/KITS/TRAYS/PACK) ×3 IMPLANT
TROCAR BALLN 12MMX100 BLUNT (TROCAR) IMPLANT
TROCAR OPTI TIP 5M 100M (ENDOMECHANICALS) ×3 IMPLANT
TROCAR XCEL 12X100 BLDLESS (ENDOMECHANICALS) IMPLANT
TROCAR XCEL DIL TIP R 11M (ENDOMECHANICALS) ×2 IMPLANT
TUBE CONNECTING 12'X1/4 (SUCTIONS) ×2
TUBE CONNECTING 12X1/4 (SUCTIONS) ×4 IMPLANT
TUBING INSUFFLATION 10FT LAP (TUBING) ×3 IMPLANT
TUBING TUR DISP (UROLOGICAL SUPPLIES) ×2 IMPLANT
WARMER LAPAROSCOPE (MISCELLANEOUS) ×3 IMPLANT
WATER STERILE IRR 500ML POUR (IV SOLUTION) ×3 IMPLANT
YANKAUER SUCT BULB TIP NO VENT (SUCTIONS) ×3 IMPLANT

## 2016-06-17 NOTE — Progress Notes (Signed)
Patient ID: [name], female   DOB: September 29, 1965, 50 y.o.   MRN: [id number] Af VSS ABS SOFT INC CLEAR GOOD UO MINIMAL BLEEDING

## 2016-06-17 NOTE — H&P (Signed)
  History and physical exam unchanged 

## 2016-06-17 NOTE — Anesthesia Procedure Notes (Signed)
Procedure Name: Intubation Date/Time: 06/17/2016 7:35 AM Performed by: Denna Haggard D Pre-anesthesia Checklist: Patient identified, Emergency Drugs available, Suction available and Patient being monitored Patient Re-evaluated:Patient Re-evaluated prior to inductionOxygen Delivery Method: Circle system utilized Preoxygenation: Pre-oxygenation with 100% oxygen Intubation Type: IV induction Ventilation: Mask ventilation without difficulty Laryngoscope Size: Mac and 3 Grade View: Grade I Tube type: Oral Tube size: 7.0 mm Number of attempts: 1 Airway Equipment and Method: Stylet and Oral airway Placement Confirmation: ETT inserted through vocal cords under direct vision,  positive ETCO2 and breath sounds checked- equal and bilateral Secured at: 22 cm Tube secured with: Tape Dental Injury: Teeth and Oropharynx as per pre-operative assessment

## 2016-06-17 NOTE — Procedures (Signed)
Patient name [name], [name] DICTATION#  [id number] CSN# [id number]  [facility], MD 06/17/2016 9:39 AM

## 2016-06-17 NOTE — Anesthesia Postprocedure Evaluation (Signed)
Anesthesia Post Note  Patient: [name]  Procedure(s) Performed: Procedure(s) (LRB): LAPAROSCOPIC ASSISTED VAGINAL HYSTERECTOMY WITH SALPINGO OOPHORECTOMY (Bilateral)  Patient location during evaluation: PACU Anesthesia Type: General Level of consciousness: awake and alert Pain management: pain level controlled Vital Signs Assessment: post-procedure vital signs reviewed and stable Respiratory status: spontaneous breathing, nonlabored ventilation, respiratory function stable and patient connected to nasal cannula oxygen Cardiovascular status: blood pressure returned to baseline and stable Postop Assessment: no signs of nausea or vomiting Anesthetic complications: no    Last Vitals:  Vitals:   06/17/16 1012 06/17/16 1015  BP:  116/67  Pulse: 81 73  Resp: 13 14  Temp:      Last Pain:  Vitals:   06/17/16 1015  TempSrc:   PainSc: [place]

## 2016-06-17 NOTE — Transfer of Care (Signed)
Immediate Anesthesia Transfer of Care Note  Patient: [name]  Procedure(s) Performed: Procedure(s) (LRB): LAPAROSCOPIC ASSISTED VAGINAL HYSTERECTOMY WITH SALPINGO OOPHORECTOMY (Bilateral)  Patient Location: PACU  Anesthesia Type: General  Level of Consciousness: awake, oriented, sedated and patient cooperative  Airway & Oxygen Therapy: Patient Spontanous Breathing and Patient connected to face mask oxygen  Post-op Assessment: Report given to PACU RN and Post -op Vital signs reviewed and stable  Post vital signs: Reviewed and stable  Complications: No apparent anesthesia complications  Last Vitals:  Vitals:   06/17/16 0945 06/17/16 0946  BP: 123/70   Pulse: 89   Resp: 13   Temp:  36.8 C

## 2016-06-18 DIAGNOSIS — N939 Abnormal uterine and vaginal bleeding, unspecified: Secondary | ICD-10-CM | POA: Diagnosis not present

## 2016-06-18 LAB — CBC
HCT: 29.6 % — ABNORMAL LOW (ref 36.0–46.0)
Hemoglobin: 9.9 g/dL — ABNORMAL LOW (ref 12.0–15.0)
MCH: 29 pg (ref 26.0–34.0)
MCHC: 33.4 g/dL (ref 30.0–36.0)
MCV: 86.8 fL (ref 78.0–100.0)
PLATELETS: 266 10*3/uL (ref 150–400)
RBC: 3.41 MIL/uL — ABNORMAL LOW (ref 3.87–5.11)
RDW: 13.8 % (ref 11.5–15.5)
WBC: 15.3 10*3/uL — ABNORMAL HIGH (ref 4.0–10.5)

## 2016-06-18 MED ORDER — OXYCODONE-ACETAMINOPHEN 7.5-325 MG PO TABS: 1.0000 | ORAL_TABLET | ORAL | 0 refills | Status: DC | PRN

## 2016-06-18 NOTE — Discharge Summary (Signed)
Patient name  [name], [place] DICTATION#  [id number] CSN# [id number]  Darlyn Chamber, MD 06/18/2016 8:38 AM

## 2016-06-18 NOTE — Progress Notes (Signed)
1 Day Post-Op Procedure(s) (LRB): LAPAROSCOPIC ASSISTED VAGINAL HYSTERECTOMY WITH SALPINGO OOPHORECTOMY (Bilateral)  Subjective: Patient reports tolerating PO and no problems voiding.    Objective: I have reviewed patient's vital signs and labs.  General: alert GI: soft, non-tender; bowel sounds normal; no masses,  no organomegaly Vaginal Bleeding: minimal  Assessment: s/p Procedure(s) with comments: LAPAROSCOPIC ASSISTED VAGINAL HYSTERECTOMY WITH SALPINGO OOPHORECTOMY (Bilateral) - need bed: stable  Plan: Discharge home  LOS: 0 days    Eara Burruel S 06/18/2016, 8:33 AM

## 2016-06-18 NOTE — Discharge Summary (Signed)
NAMESCOTTI, [name]                 ACCOUNT NO.:  [id number]  MEDICAL RECORD NO.:  [id number]  LOCATION:                                 FACILITY:  PHYSICIAN:  Darlyn Chamber, M.D.        DATE OF BIRTH:  DATE OF ADMISSION:  06/17/2016 DATE OF DISCHARGE:  06/18/2016                              DISCHARGE SUMMARY   ADMITTING DIAGNOSES:  Abnormal perimenopausal bleeding, unresponsive conservative management.  Personal history of breast cancer.  POSTOPERATIVE DIAGNOSES:  Abnormal perimenopausal bleeding, unresponsive conservative management.  Personal history of breast cancer.  OPERATIVE PROCEDURE:  Laparoscopic-assisted vaginal hysterectomy with bilateral salpingo-oophorectomy.  Cystoscopy.  For history and physical please see dictated note.  COURSE IN THE HOSPITAL:  The patient underwent above-noted surgery. Nothing unusual was encountered during the operative procedure. Postoperatively, she did well.  Her postop hemoglobin was 9.9.  On her 2nd postop day, she was afebrile, stable vital signs.  Abdomen was soft. Bowel sounds were active.  All incisions were clear.  She was having minimal bleeding, was voiding without difficulty.  In terms of complication, none were encountered during stay in the hospital.  The patient is discharged home in stable condition.  DISPOSITION:  The patient is to avoid heavy lifting, vaginal entrance, or driving of a car.  She was instructed to call should there be signs of heavy vaginal bleeding.  Fever should be reported.  Nausea, vomiting should be reported.  Excessive pain should be reported.  Instructed of signs and symptoms of deep venous thrombosis and pulmonary embolus. Given her prescription for Percocet to use as needed for pain.  The patient will follow up in the office in 1 week.     Darlyn Chamber, M.D.     JSM/MEDQ  D:  06/18/2016  T:  06/18/2016  Job:  [id number]

## 2016-06-18 NOTE — Op Note (Signed)
[name], [name]                 ACCOUNT NO.:  [id number]  MEDICAL RECORD NO.:  [id number]  LOCATION:                                 FACILITY:  PHYSICIAN:  Darlyn Chamber, M.D.   DATE OF BIRTH:  16-Mar-1966  DATE OF PROCEDURE:  06/17/2016 DATE OF DISCHARGE:                              OPERATIVE REPORT   PREOPERATIVE DIAGNOSES: 1. Abnormal uterine bleeding. 2. Personal history of breast cancer.  POSTOPERATIVE DIAGNOSES: 1. Abnormal uterine bleeding. 2. Personal history of breast cancer.  OPERATIVE PROCEDURES:  Laparoscopic-assisted vaginal hysterectomy, bilateral salpingo-oophorectomy, and cystoscopy.  ASSISTANT:  Dr. Lindwood Coke.  ANESTHESIA:  General endotracheal.  ESTIMATED BLOOD LOSS:  300 mL.  PACKS:  None.  DRAINS:  Clear urethral Foley.  INTRAOPERATIVE BLOOD PLACED:  None.  COMPLICATIONS:  None.  INDICATION:  As dictated in history and physical.  DESCRIPTION OF PROCEDURE:  The patient was taken to the OR and placed in supine position.  After satisfactory level of general endotracheal anesthesia was obtained, the patient was placed in the dorsal lithotomy position using the Allen stirrups.  The abdomen, perineum, and vagina were prepped out with Betadine.  Full bladder was emptied by in-and-out catheterization.  A Hulka tenaculum was put in place and secured.  The patient was then draped in sterile field.  A subumbilical incision was made with knife.  The Veress needle was induced into the abdominal cavity.  The abdomen was inflated with approximately 3 L of carbon dioxide.  The trocar was put in place along with the laparoscope visualization revealed __________ organs.  A 5-mm trocar was put in place under direct visualization.  Visualization of the upper abdomen including tip of the gallbladder and both lateral gutters including appendix were normal.  Uterus was enlarged, looked like it had a fundal fibroid.  She had a paratubal cyst on the right and  left duodenal__________.  We grasped the tube on the right side and elevated it along with the ovary.  The paratubal cyst was ruptured to get __________.  Using the EnSeal, the ovarian vasculature was identified, cauterized, and incised.  The mesenteric attachments of the ovary and tube were then cauterized and incised up to the round ligament.  The round ligament was then cauterized and incised.  We then went to the left side.  The left tube was used to elevate the ovary.  The ovarian vascular with identifying.  Using the EnSeal, it was cauterized and incised.  The perineal attachments of the tubes and the ovaries then cauterized and incised up to the round ligament.  The round ligament was then cauterized and incised.  We had good hemostasis.  The decision was to go vaginally.  The abdomen was desufflated with carbon dioxide.  The laparoscope was then removed.  The patient's legs were repositioned. The Hulka tenaculum was then removed.  A weighted speculum was placed in the vaginal vault.  Cervix was grasped with single-tooth tenaculum.  Cul- de-sac was entered sharply.  Both uterosacral ligaments were clamped, cut, and suture ligated with 0-Vicryl.  __________ anteriorly was incised using the Bovie.  We placed the bladder superiorly. Paracervical tissue was then clamped, cut,  and suture ligated with 0 Vicryl.  We kept dissecting the bladder superiorly __________ able to entered the vesicouterine space.  Using the clamped, cut, and tied technique with suture ligature __________ parametrium was serially separated from the sides of uterus.  We got to the point where we were up on the uterine walls, but could not go any further.  Decision was to __________.  First the cervix and cervical stump was excised.  We then excised portions of the uterus.  The uterus was rotated anteriorly and more sections were resected.  __________ delivered the uterine fundus. Using the finger, we were able to  go posterior to the fundus, identify the peritoneum to the bladder flap, and entered it sharply.  Next, the remaining pedicles were clamped and cut and uterus, tubes, and ovaries were passed off the operative field.  Held pedicles were secured with free ties of 0 Vicryl.  Posterior vaginal cuff was run with a running locking suture of 0 Vicryl.  Vaginal mucosa was reapproximated with figure-of-eights of 0 Vicryl.  Cystoscopy was then performed.  She had been given __________.  Green-tinged urine was noted to be coming from both ureteral orifices.  There was no bladder injury.  At this point in time, the cystoscope was removed.  A Foley was placed to straight drain.  Laparoscope was reintroduced as __________ carbon dioxide.  We irrigated the pelvis __________ clots and free blood.  The right pelvic sidewall was hemostatically intact.  With the ovary been removed, she had some bleeding from the vaginal cuff, but under control with the Enseal.  The area on the left pelvic sidewall where the ovary been removed was also hemostatically intact.  We __________ the abdomen and saw no further bleeding.  We further __________ the abdomen and the laparoscope and trocars were removed.  Subumbilical incision was closed with interrupted sutures of 4-0 Vicryl.  Suprapubic incision was closed with Dermabond. Sponge, instrument, and needle counts were reported as correct by circulating nurse x3.  Foley catheter remained clear at the time of closure.  The patient tolerated the procedure well, was returned to the recovery room in good condition.     Darlyn Chamber, M.D.   ______________________________ Darlyn Chamber, M.D.    JSM/MEDQ  D:  06/17/2016  T:  06/17/2016  Job:  [id number]

## 2016-06-23 ENCOUNTER — Encounter (HOSPITAL_BASED_OUTPATIENT_CLINIC_OR_DEPARTMENT_OTHER): Payer: Self-pay | Admitting: Obstetrics and Gynecology

## 2016-07-15 ENCOUNTER — Encounter: Payer: Self-pay | Admitting: Neurology

## 2016-07-15 ENCOUNTER — Ambulatory Visit (INDEPENDENT_AMBULATORY_CARE_PROVIDER_SITE_OTHER): Payer: BC Managed Care – PPO | Admitting: Neurology

## 2016-07-15 VITALS — BP 109/69 | HR 79 | Ht 67.0 in | Wt 198.5 lb

## 2016-07-15 DIAGNOSIS — G43709 Chronic migraine without aura, not intractable, without status migrainosus: Secondary | ICD-10-CM

## 2016-07-15 DIAGNOSIS — IMO0002 Reserved for concepts with insufficient information to code with codable children: Secondary | ICD-10-CM

## 2016-07-15 MED ORDER — PROPRANOLOL HCL 20 MG PO TABS: 20.0000 mg | ORAL_TABLET | Freq: Two times a day (BID) | ORAL | 11 refills | Status: DC

## 2016-07-15 NOTE — Progress Notes (Signed)
-     PATIENT: [name] DOB: September 16, 1965  Chief Complaint  Patient presents with  . Migraine    She stopped taking nortriptyline because she felt it caused constipation.  Also, she has noticed an increase in her headaches since having a hysterectomy on 06/17/16.  Reports tingling in her bilateral arms after injecting sumatriptan into her thigh.  She would like to discuss medication options.     HISTORICAL  [name] is a 50 years old right-handed female, seen in refer by Dr. Arnetha Gula for evaluation of increased migraine headache in Dec 10 2015  She had a history of left breast cancer, status post left lobectomy in November 2014, taking Aromasin  She reported a history of migraine since 1994, her typical migraine are severe pounding headache with associated light noise sensitivity, nauseous, lasting for 2-3 days, she has to take off from work, she had frequent headaches around her mid thirties, even then she has headaches about every few months,  Since January 2017, after she stopped having migraines for few years, she began to have headaches again, she is under a lot of stress, taking care of her elderly father who suffered Alzheimer's disease  She is taking over-the-counter Excedrin Migraine with limited help, has tried different over-the-counter medications, Tylenol, Aleve, ice pack, pressure band with no help, she has never tried prescription medications   She has gone through menopause from September 2016 to February 7017.  Laboratory evaluation in December 2016, normal thyroid function test, F S H 30.7 normal CMP with creatinine of 0.65, lipid profile showed normal cholesterol 151, LDL 87, vitamin D level was within normal limits 40   UPDATE August 17th 2017: Since last visit in May, she used all prescribed Maxalt for her migraine, average 1 typical migraine each week, Sometimes the migraine is prolonged, require multiple dose of Maxalt, she also complains of excessive  stress at home, taking care of her elderly parents, difficulty sleeping,   UPDATE Dec 19th 2017: She has increased migraine since her hysterectomy in Nov 2017, she has daily migraine since Nov 21, she is now taking Maxalt, which is helpful, imitrex SQ helps her headache, but her arm felt numb for 2 hours.   REVIEW OF SYSTEMS: Full 14 system review of systems performed and notable only for As above  ALLERGIES: Allergies  Allergen Reactions  . Penicillins Rash    Has patient had a PCN reaction causing immediate rash, facial/tongue/throat swelling, SOB or lightheadedness with hypotension: No. Rash not immediate.  Has patient had a PCN reaction causing severe rash involving mucus membranes or skin necrosis: No Has patient had a PCN reaction that required hospitalization No Has patient had a PCN reaction occurring within the last 10 years: No If all of the above answers are "NO", then may proceed with Cephalosporin use.     HOME MEDICATIONS: Current Outpatient Prescriptions  Medication Sig Dispense Refill  . aspirin-acetaminophen-caffeine (EXCEDRIN MIGRAINE) 250-250-65 MG per tablet Take by mouth every 6 (six) hours as needed for headache.    . Calcium Carbonate-Vitamin D (CALCIUM + D PO) Take 2 tablets by mouth daily.     . cholecalciferol (VITAMIN D) 1000 units tablet Take 1,000 Units by mouth daily.     . cyanocobalamin 500 MCG tablet Take 500 mcg by mouth daily.    Marland Kitchen exemestane (AROMASIN) 25 MG tablet Take 1 tablet (25 mg total) by mouth daily after breakfast. 30 tablet 12  . KRILL OIL OMEGA-3 PO Take  1 tablet by mouth daily.     . magnesium oxide (MAG-OX) 400 MG tablet Take 400 mg by mouth daily.    . Multiple Vitamins-Iron (MULTIVITAMIN/IRON PO) Take 1 tablet by mouth daily.    . ondansetron (ZOFRAN ODT) 4 MG disintegrating tablet Take 1 tablet (4 mg total) by mouth every 8 (eight) hours as needed for nausea or vomiting. 20 tablet 3  . OVER THE COUNTER MEDICATION [place] for sleep.      . Riboflavin 100 MG CAPS Take 100 mg by mouth daily.    . rizatriptan (MAXALT-MLT) 10 MG disintegrating tablet Take 1 tablet (10 mg total) by mouth as needed. May repeat in 2 hours if needed 36 tablet 4  . SUMAtriptan (IMITREX STATDOSE SYSTEM) 6 MG/0.5ML SOAJ Inject 6 mg into the skin as needed. 10 cartridge 11  . SYNTHROID 100 MCG tablet TK 1 T PO QD--  TAKES IN AM  2   No current facility-administered medications for this visit.     PAST MEDICAL HISTORY: Past Medical History:  Diagnosis Date  . Abnormal perimenopausal bleeding   . Chronic migraine   . Complication of anesthesia    woke up agitated after gum surgery/  post-op panic attack 2014 w/ breast lumpectomy  . History of exercise intolerance 03/21/2014   normal , no ischemia  . History of panic attacks    POST OP SURGERY  . Hyperlipidemia   . Hypothyroidism   . Lobular breast cancer, left [facility]) ONCOLOGIST-  DR Marin Olp-- no recurrence   dx  11/ 2014  Stage 0 (Tis, Nx, M0)  lobular carcinoma in situ-- s/p  left breast lumpectomy 06-20-2013-- no chemo or radiation--  currently on aromasin therapy  . Wears glasses     PAST SURGICAL HISTORY: Past Surgical History:  Procedure Laterality Date  . BREAST LUMPECTOMY WITH NEEDLE LOCALIZATION Left 06/20/2013   Procedure: BREAST LUMPECTOMY WITH NEEDLE LOCALIZATION;  Surgeon: Merrie Roof, MD;  Location: [place];  Service: General;  Laterality: Left;  . D & C HYSTERECTOMY W/ RESECTION POLYPS    . GUM SURGERY  1997  approx   SEDATION  . LAPAROSCOPIC VAGINAL HYSTERECTOMY WITH SALPINGO OOPHORECTOMY Bilateral 06/17/2016   Procedure: LAPAROSCOPIC ASSISTED VAGINAL HYSTERECTOMY WITH SALPINGO OOPHORECTOMY;  Surgeon: Arvella Nigh, MD;  Location: [place];  Service: Gynecology;  Laterality: Bilateral;  need bed  . TRANSTHORACIC ECHOCARDIOGRAM  03/21/2014   grade 1 diastolic dysfunction, ef 25-36%/  mild PR/  trivial TR    FAMILY HISTORY: Family History   Problem Relation Age of Onset  . Breast cancer Mother 55  . Heart disease Mother     Atrial fibrillation  . Melanoma Father 1  . Breast cancer Sister 57    ER+/her2-; onco score = 8  . Cancer Maternal Aunt     oral cancer; smoker  . Head & neck cancer Paternal Uncle     smoker  . Breast cancer Cousin     dx in her late 3s-60s    SOCIAL HISTORY:  Social History   Social History  . Marital status: Married    Spouse name: [name]  . Number of children: 0  . Years of education: Masters   Occupational History  . Medical Records    Social History Main Topics  . Smoking status: Never Smoker  . Smokeless tobacco: Never Used  . Alcohol use No  . Drug use: No  . Sexual activity: Yes   Other Topics Concern  .  Not on file   Social History Narrative   Lives at home with husband.   Right-handed.   2-3 cups caffeine daily.     PHYSICAL EXAM   Vitals:   07/15/16 0734  BP: 109/69  Pulse: 79  Weight: 198 lb 8 oz (90 kg)  Height: _0  (1.702 m)    Not recorded      Body mass index is 31.09 kg/m.  PHYSICAL EXAMNIATION:  Gen: NAD, conversant, well nourised, obese, well groomed                     Cardiovascular: Regular rate rhythm, no peripheral edema, warm, nontender. Eyes: Conjunctivae clear without exudates or hemorrhage Neck: Supple, no carotid bruise. Pulmonary: Clear to auscultation bilaterally   NEUROLOGICAL EXAM:  MENTAL STATUS: Speech:    Speech is normal; fluent and spontaneous with normal comprehension.  Cognition:     Orientation to time, place and person     Normal recent and remote memory     Normal Attention span and concentration     Normal Language, naming, repeating,spontaneous speech     Fund of knowledge   CRANIAL NERVES: CN II: Visual fields are full to confrontation. Fundoscopic exam is normal with sharp discs and no vascular changes. Pupils are round equal and briskly reactive to light. CN III, IV, VI: extraocular movement are  normal. No ptosis. CN V: Facial sensation is intact to pinprick in all 3 divisions bilaterally. Corneal responses are intact.  CN VII: Face is symmetric with normal eye closure and smile. CN VIII: Hearing is normal to rubbing fingers CN IX, X: Palate elevates symmetrically. Phonation is normal. CN XI: Head turning and shoulder shrug are intact CN XII: Tongue is midline with normal movements and no atrophy.  MOTOR: There is no pronator drift of out-stretched arms. Muscle bulk and tone are normal. Muscle strength is normal.  REFLEXES: Reflexes are 2+ and symmetric at the biceps, triceps, knees, and ankles. Plantar responses are flexor.  SENSORY: Intact to light touch, pinprick, positional sensation and vibratory sensation are intact in fingers and toes.  COORDINATION: Rapid alternating movements and fine finger movements are intact. There is no dysmetria on finger-to-nose and heel-knee-shin.    GAIT/STANCE: Posture is normal. Gait is steady with normal steps, base, arm swing, and turning. Heel and toe walking are normal. Tandem gait is normal.  Romberg is absent.   DIAGNOSTIC DATA (LABS, IMAGING, TESTING) - I reviewed patient records, labs, notes, testing and imaging myself where available.   ASSESSMENT AND PLAN  [name] is a 50 y.o. female   Chronic migraine  Could not tolerate nortriptyline due to constipation  Inderal 52m bid as preventive medications.  Maxalt 10 mg as needed  Zofran 4 mg as needed  Imitrex 6 milligram as needed subcutaneous injection for his severe migraine headaches   YMarcial Pacas M.D. Ph.D.  [facility] [address] [place] [address]Ph: [phone]: [phone] CC: JArvella Nigh MD

## 2016-07-29 ENCOUNTER — Ambulatory Visit
Admission: RE | Admit: 2016-07-29 | Discharge: 2016-07-29 | Disposition: A | Discharge status: Home / Self Care | Payer: BC Managed Care – PPO | Source: Ambulatory Visit | Attending: Obstetrics and Gynecology | Admitting: Obstetrics and Gynecology

## 2016-07-29 DIAGNOSIS — Z803 Family history of malignant neoplasm of breast: Secondary | ICD-10-CM

## 2016-07-29 DIAGNOSIS — Z853 Personal history of malignant neoplasm of breast: Secondary | ICD-10-CM

## 2016-07-29 MED ORDER — GADOBENATE DIMEGLUMINE 529 MG/ML IV SOLN
19.0000 mL | Freq: Once | INTRAVENOUS | Status: AC | PRN
  Administered 2016-07-29: 19 mL via INTRAVENOUS

## 2016-07-30 ENCOUNTER — Other Ambulatory Visit: Payer: Self-pay | Admitting: Obstetrics and Gynecology

## 2016-07-31 ENCOUNTER — Other Ambulatory Visit: Payer: Self-pay | Admitting: Obstetrics and Gynecology

## 2016-07-31 DIAGNOSIS — R9389 Abnormal findings on diagnostic imaging of other specified body structures: Secondary | ICD-10-CM

## 2016-08-04 ENCOUNTER — Ambulatory Visit
Admission: RE | Admit: 2016-08-04 | Discharge: 2016-08-04 | Disposition: A | Discharge status: Home / Self Care | Payer: BC Managed Care – PPO | Source: Ambulatory Visit | Attending: Obstetrics and Gynecology | Admitting: Obstetrics and Gynecology

## 2016-08-04 DIAGNOSIS — R9389 Abnormal findings on diagnostic imaging of other specified body structures: Secondary | ICD-10-CM

## 2016-08-04 MED ORDER — GADOBENATE DIMEGLUMINE 529 MG/ML IV SOLN
19.0000 mL | Freq: Once | INTRAVENOUS | Status: AC | PRN
  Administered 2016-08-04: 19 mL via INTRAVENOUS

## 2016-08-06 ENCOUNTER — Telehealth: Payer: Self-pay | Admitting: *Deleted

## 2016-08-06 NOTE — Telephone Encounter (Signed)
Patient is an established patient of Dr Marin Olp and she has been given a new breast cancer diagnosis. She is seeing her surgeon on Friday and wants to know when she should see Dr Marin Olp.  Spoke with Dr Marin Olp and he wants patient to come in next week. Patient aware and message sent to scheduler.

## 2016-08-12 DIAGNOSIS — D0512 Intraductal carcinoma in situ of left breast: Secondary | ICD-10-CM | POA: Insufficient documentation

## 2016-08-13 ENCOUNTER — Other Ambulatory Visit (HOSPITAL_BASED_OUTPATIENT_CLINIC_OR_DEPARTMENT_OTHER): Payer: BC Managed Care – PPO

## 2016-08-13 ENCOUNTER — Ambulatory Visit (HOSPITAL_BASED_OUTPATIENT_CLINIC_OR_DEPARTMENT_OTHER): Payer: BC Managed Care – PPO | Admitting: Hematology & Oncology

## 2016-08-13 VITALS — BP 107/68 | HR 76 | Temp 98.2°F | Resp 16 | Wt 199.0 lb

## 2016-08-13 DIAGNOSIS — M818 Other osteoporosis without current pathological fracture: Secondary | ICD-10-CM

## 2016-08-13 DIAGNOSIS — Z171 Estrogen receptor negative status [ER-]: Secondary | ICD-10-CM

## 2016-08-13 DIAGNOSIS — D0512 Intraductal carcinoma in situ of left breast: Secondary | ICD-10-CM

## 2016-08-13 DIAGNOSIS — D0502 Lobular carcinoma in situ of left breast: Secondary | ICD-10-CM

## 2016-08-13 DIAGNOSIS — T386X5A Adverse effect of antigonadotrophins, antiestrogens, antiandrogens, not elsewhere classified, initial encounter: Secondary | ICD-10-CM

## 2016-08-13 LAB — CBC WITH DIFFERENTIAL (CANCER CENTER ONLY)
BASO#: 0 10*3/uL (ref 0.0–0.2)
BASO%: 0.4 % (ref 0.0–2.0)
EOS%: 1 % (ref 0.0–7.0)
Eosinophils Absolute: 0.1 10*3/uL (ref 0.0–0.5)
HEMATOCRIT: 38.6 % (ref 34.8–46.6)
HGB: 12.7 g/dL (ref 11.6–15.9)
LYMPH#: 2 10*3/uL (ref 0.9–3.3)
LYMPH%: 39.8 % (ref 14.0–48.0)
MCH: 28.1 pg (ref 26.0–34.0)
MCHC: 32.9 g/dL (ref 32.0–36.0)
MCV: 85 fL (ref 81–101)
MONO#: 0.5 10*3/uL (ref 0.1–0.9)
MONO%: 9.6 % (ref 0.0–13.0)
NEUT#: 2.5 10*3/uL (ref 1.5–6.5)
NEUT%: 49.2 % (ref 39.6–80.0)
Platelets: 314 10*3/uL (ref 145–400)
RBC: 4.52 10*6/uL (ref 3.70–5.32)
RDW: 13.5 % (ref 11.1–15.7)
WBC: 5 10*3/uL (ref 3.9–10.0)

## 2016-08-13 LAB — CMP (CANCER CENTER ONLY)
ALK PHOS: 65 U/L (ref 26–84)
ALT: 21 U/L (ref 10–47)
AST: 22 U/L (ref 11–38)
Albumin: 4.4 g/dL (ref 3.3–5.5)
BUN, Bld: 10 mg/dL (ref 7–22)
CALCIUM: 9.4 mg/dL (ref 8.0–10.3)
CO2: 29 mEq/L (ref 18–33)
Chloride: 102 mEq/L (ref 98–108)
Creat: 0.7 mg/dl (ref 0.6–1.2)
GLUCOSE: 104 mg/dL (ref 73–118)
POTASSIUM: 3.5 meq/L (ref 3.3–4.7)
Sodium: 136 mEq/L (ref 128–145)
Total Bilirubin: 0.8 mg/dl (ref 0.20–1.60)
Total Protein: 7.7 g/dL (ref 6.4–8.1)

## 2016-08-13 NOTE — Progress Notes (Signed)
Hematology and Oncology Follow Up Visit  [name] [id number] 11/18/1965 51 y.o. 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 Unfortunately, we now have a new problem. She now has a carcinoma in situ in the left breast.  We last saw her back in November. She wanted to have a hysterectomy. She had this without difficulty. The pathology report [id number]) did not show any malignancy with the uterus, cervix or ovaries.  She then noticed some firmness in the left breast. Of note, she had a mammogram done back in November which was normal. She uly then had an MRI. This was in early January. Shockingly, the MRI showed 2 masses in the left breast. One measured 5.2 x 2.4 x 1 cm. This is in the lower outer quadrant. A second mass was 1.6 x 1.4 x 0.9 cm. This was in the anterior inferior portion.  She then underwent biopsies. This was done on January 8. The pathology report ([id number]) showed high-grade ductal carcinoma in situ. There was suspicious of invasion in the first tumor.  Prognostic markers were done. She was ER negative and PR negative.  She now comes in to see me. She is seeing Dr. Marlou Starks of surgery. She is seeing plastic surgery. Dr. Marlou Starks felt that a bilateral mastectomy would be needed with reconstruction.  She is feeling well. She recovered from her hysterectomy without any problems. She is not having any issues with swelling or pain in the left breast. There is no cough or shortness of breath. She's had no hot flashes or sweats. She is on Aromasin. I told her to go ahead and stop this. Not sure how much this really is going to help her right now since the carcinoma in situ is ER negative.   She's had no change in bowel or bladder habits.   Her overall performance status is ECOG  0.   Medications:  Current Outpatient Prescriptions:  .  aspirin-acetaminophen-caffeine (EXCEDRIN MIGRAINE) 250-250-65 MG per tablet, Take by mouth every 6 (six) hours as needed for headache., Disp: , Rfl:  .  Calcium Carbonate-Vitamin D (CALCIUM + D PO), Take 2 tablets by mouth daily. , Disp: , Rfl:  .  cholecalciferol (VITAMIN D) 1000 units tablet, Take 1,000 Units by mouth daily. , Disp: , Rfl:  .  cyanocobalamin 500 MCG tablet, Take 500 mcg by mouth daily., Disp: , Rfl:  .  exemestane (AROMASIN) 25 MG tablet, Take 1 tablet (25 mg total) by mouth daily after breakfast., Disp: 30 tablet, Rfl: 12 .  KRILL OIL OMEGA-3 PO, Take 1 tablet by mouth daily. , Disp: , Rfl:  .  magnesium oxide (MAG-OX) 400 MG tablet, Take 400 mg by mouth daily., Disp: , Rfl:  .  Multiple Vitamins-Iron (MULTIVITAMIN/IRON PO), Take 1 tablet by mouth daily., Disp: , Rfl:  .  ondansetron (ZOFRAN ODT) 4 MG disintegrating tablet, Take 1 tablet (4 mg total) by mouth every 8 (eight) hours as needed for nausea or vomiting., Disp: 20 tablet, Rfl: 3 .  OVER THE COUNTER MEDICATION, Zquil for sleep., Disp: , Rfl:  .  propranolol (INDERAL) 20 MG tablet, Take 1 tablet (20 mg total) by mouth 2 (two) times daily., Disp: 60 tablet, Rfl: 11 .  Riboflavin 100 MG CAPS, Take 100 mg by  mouth daily., Disp: , Rfl:  .  rizatriptan (MAXALT-MLT) 10 MG disintegrating tablet, Take 1 tablet (10 mg total) by mouth as needed. May repeat in 2 hours if needed, Disp: 36 tablet, Rfl: 4 .  SUMAtriptan (IMITREX STATDOSE SYSTEM) 6 MG/0.5ML SOAJ, Inject 6 mg into the skin as needed., Disp: 10 cartridge, Rfl: 11 .  SYNTHROID 100 MCG tablet, TK 1 T PO QD--  TAKES IN AM, Disp: , Rfl: 2  Allergies:  Allergies  Allergen Reactions  . Penicillin G Rash  . Penicillins Rash    Has patient had a PCN reaction causing immediate rash, facial/tongue/throat swelling, SOB or lightheadedness with hypotension: No. Rash not immediate.  Has patient had a PCN reaction  causing severe rash involving mucus membranes or skin necrosis: No Has patient had a PCN reaction that required hospitalization No Has patient had a PCN reaction occurring within the last 10 years: No If all of the above answers are "NO", then may proceed with Cephalosporin use.     Past Medical History, Surgical history, Social history, and Family History were reviewed and updated.  Review of Systems: As above  Physical Exam:  weight is 199 lb (90.3 kg). Her oral temperature is 98.2 F (36.8 C). Her blood pressure is 107/68 and her pulse is 76. Her respiration is 16 and oxygen saturation is 100%.   Wt Readings from Last 3 Encounters:  08/13/16 199 lb (90.3 kg)  07/15/16 198 lb 8 oz (90 kg)  06/17/16 199 lb 1.2 oz (90.3 kg)     Well-developed well-nourished white female in no obvious distress. Head and neck exam shows no ocular or oral lesions. There are no palpable cervical or supraclavicular lymph nodes. Lungs are clear. Cardiac exam regular rate and rhythm with no murmurs, rubs or bruits. Abdomen is soft. She has good bowel sounds. There is no fluid wave. There may be some slight tenderness in the left lower quadrant to palpation. No fluid wave is noted. There is no palpable liver or spleen tip. Breast exam shows right breast with no masses, edema or erythema. There is no right axillary adenopathy. Left breast shows a well-healed lumpectomy at the 1:00 position. She has no firmness at the lumpectomy site. She has no distinct mass in the left breast. There is no swelling about the left nipple. There is no redness associated with the [name]. I see no rash. There is no left axillary adenopathy. Extremities shows no clubbing, cyanosis or edema. She has good range of motion of his joints. Back exam shows no tenderness over the spine, ribs or hips. Skin exam shows no rashes, ecchymoses or petechia. Neurological exam shows no focal neurological deficits.  Lab Results  Component Value Date   WBC  5.0 08/13/2016   HGB 12.7 08/13/2016   HCT 38.6 08/13/2016   MCV 85 08/13/2016   PLT 314 08/13/2016     Chemistry      Component Value Date/Time   NA 136 08/13/2016 1149   NA 138 05/30/2016 0746   K 3.5 08/13/2016 1149   K 3.8 05/30/2016 0746   CL 102 08/13/2016 1149   CO2 29 08/13/2016 1149   CO2 25 05/30/2016 0746   BUN 10 08/13/2016 1149   BUN 10.0 05/30/2016 0746   CREATININE 0.7 08/13/2016 1149   CREATININE 0.7 05/30/2016 0746      Component Value Date/Time   CALCIUM 9.4 08/13/2016 1149   CALCIUM 9.2 05/30/2016 0746   ALKPHOS 65 08/13/2016 1149   ALKPHOS  71 05/30/2016 0746   AST 22 08/13/2016 1149   AST 16 05/30/2016 0746   ALT 21 08/13/2016 1149   ALT 11 05/30/2016 0746   BILITOT 0.80 08/13/2016 1149   BILITOT 0.83 05/30/2016 0746         Impression and Plan: Ms. Chambley is 51 year old white female. Overall, condition is doing okay. She is postmenopausal. She had her ovaries removed.  I agree with Dr. Marlou Starks that bilateral mastectomy is probably indicated. It is amazing that she had an MRI that was negative a year ago. Her mammogram that was done just 2 months ago did not show any breast mass. Given that she has the lobular carcinoma in situ, this would be construed as a risk factor for invasive disease.  I think that a bilateral mastectomy would decrease her risk of getting breast cancer by 97-98%. I told her that even with mastectomies, there is still a little bit of breast tissue that is left behind.  With reconstruction techniques, she should have a really good cosmetic result. She will not need radiation from my point of view.  I think the real question is whether not she will need chemotherapy. If she has invasive estrogen negative breast cancer, then I would think chemotherapy is going to be necessary.  I spent about 45 minutes with she and her husband. I'm totally "blown away" by the fact that she has carcinoma in situ and that this was so large and yet  not identified on a mammogram 2 months ago. I told her this can often happen with carcinoma in situ and that having a mastectomy I think will be the best thing for her for a positive long-term outcome.  She feels better now about having a bilateral mastectomy done.  I will like to see her back in about 6 weeks. By then, she should've had her surgery and we can see what the pathology shows.  [name], MD 1/17/201812:23 PM

## 2016-08-15 LAB — LUTEINIZING HORMONE: LH: 44 m[IU]/mL

## 2016-08-15 LAB — FOLLICLE STIMULATING HORMONE: FSH: 103.4 m[IU]/mL

## 2016-08-15 LAB — VITAMIN D 25 HYDROXY (VIT D DEFICIENCY, FRACTURES): Vitamin D, 25-Hydroxy: 47.7 ng/mL (ref 30.0–100.0)

## 2016-08-18 LAB — ESTRADIOL, ULTRA SENS

## 2016-08-20 ENCOUNTER — Ambulatory Visit: Payer: Self-pay | Admitting: General Surgery

## 2016-08-20 DIAGNOSIS — D0512 Intraductal carcinoma in situ of left breast: Secondary | ICD-10-CM

## 2016-08-27 ENCOUNTER — Other Ambulatory Visit: Payer: BC Managed Care – PPO

## 2016-08-27 ENCOUNTER — Ambulatory Visit: Payer: BC Managed Care – PPO | Admitting: Hematology & Oncology

## 2016-09-02 ENCOUNTER — Encounter (HOSPITAL_COMMUNITY): Payer: Self-pay | Admitting: *Deleted

## 2016-09-02 ENCOUNTER — Encounter (HOSPITAL_COMMUNITY)
Admission: RE | Admit: 2016-09-02 | Discharge: 2016-09-02 | Disposition: A | Discharge status: Home / Self Care | Payer: BC Managed Care – PPO | Source: Ambulatory Visit | Attending: General Surgery | Admitting: General Surgery

## 2016-09-02 DIAGNOSIS — Z01818 Encounter for other preprocedural examination: Secondary | ICD-10-CM | POA: Diagnosis not present

## 2016-09-02 DIAGNOSIS — D0512 Intraductal carcinoma in situ of left breast: Secondary | ICD-10-CM | POA: Diagnosis not present

## 2016-09-02 LAB — BASIC METABOLIC PANEL
ANION GAP: 8 (ref 5–15)
BUN: 14 mg/dL (ref 6–20)
CALCIUM: 9.6 mg/dL (ref 8.9–10.3)
CO2: 26 mmol/L (ref 22–32)
CREATININE: 0.67 mg/dL (ref 0.44–1.00)
Chloride: 104 mmol/L (ref 101–111)
GFR calc Af Amer: >60 mL/min (ref >60)
GLUCOSE: 97 mg/dL (ref 65–99)
Potassium: 3.9 mmol/L (ref 3.5–5.1)
Sodium: 138 mmol/L (ref 135–145)

## 2016-09-02 LAB — CBC
HCT: 39.9 % (ref 36.0–46.0)
Hemoglobin: 12.9 g/dL (ref 12.0–15.0)
MCH: 27.3 pg (ref 26.0–34.0)
MCHC: 32.3 g/dL (ref 30.0–36.0)
MCV: 84.5 fL (ref 78.0–100.0)
PLATELETS: 273 10*3/uL (ref 150–400)
RBC: 4.72 MIL/uL (ref 3.87–5.11)
RDW: 13.9 % (ref 11.5–15.5)
WBC: 4 10*3/uL (ref 4.0–10.5)

## 2016-09-02 NOTE — Pre-Procedure Instructions (Signed)
    [name]  09/02/2016      [facility] [phone] - [place], [place] - [address] [place] [address] Phone: [phone] Fax: [phone]    Your procedure is scheduled on Monday, February 12.  Report to [facility] Admitting at 7:00 AM              Your surgery or procedure is scheduled for 9:00 AM   Call this number if you have problems the morning of surgery: [phone]                For any other questions, please call [phone], Monday - Friday 8 AM - 4 PM.    Remember:  Do not eat food or drink liquids after midnight Sunday, February 11.                At 5:00 am drink the carton of  'Breeze' that you were given in PAT.  Take these medicines the morning of surgery with A SIP OF WATER: SYNTHROID.                  May take if needed :rizatriptan (MAXALT-MLT).                 1 Week prior to surgery STOP taking Aspirin, Aspirin Products (Goody Powder, Excedrin Migraine), Ibuprofen (Advil), Naproxen (Aleve), Vitamins and Herbal Products (ie Fish Oil)   Do not wear jewelry, make-up or nail polish.  Do not wear lotions, powders, or perfumes, or deodorant.  Do not shave 48 hours prior to surgery.   Do not bring valuables to the hospital.  [facility] is not responsible for any belongings or valuables.  Contacts, dentures or bridgework may not be worn into surgery.  Leave your suitcase in the car.  After surgery it may be brought to your room.  For patients admitted to the hospital, discharge time will be determined by your treatment team.  Patients discharged the day of surgery will not be allowed to drive home.   Name and phone number of your driver:   -  Special instructions:  Review  [place] - Preparing For Surgery.   Please read over the following fact sheets that you were given: Review  [place] - Preparing For Surgery, Coughing and Deep Breathing and Pain Booklet

## 2016-09-02 NOTE — Pre-Procedure Instructions (Signed)
    [name]  09/02/2016      [facility] [phone] - [place], [place] - [address] [place] [address] Phone: [phone] Fax: [phone]    Your procedure is scheduled on Monday, February 12.  Report to [facility] Admitting at 7:00 AM              Your surgery or procedure is scheduled for 9:00 AM   Call this number if you have problems the morning of surgery: [phone]                For any other questions, please call [phone], Monday - Friday 8 AM - 4 PM.    Remember:  Do not eat food or drink liquids after midnight Sunday, February 11.  Take these medicines the morning of surgery with A SIP OF WATER: SYNTHROID.                  May take if needed :rizatriptan (MAXALT-MLT).                 1 Week prior to surgery STOP taking Aspirin, Aspirin Products (Goody Powder, Excedrin Migraine), Ibuprofen (Advil), Naproxen (Aleve), Vitamins and Herbal Products (ie Fish Oil)   Do not wear jewelry, make-up or nail polish.  Do not wear lotions, powders, or perfumes, or deodorant.  Do not shave 48 hours prior to surgery.   Do not bring valuables to the hospital.  [facility] is not responsible for any belongings or valuables.  Contacts, dentures or bridgework may not be worn into surgery.  Leave your suitcase in the car.  After surgery it may be brought to your room.  For patients admitted to the hospital, discharge time will be determined by your treatment team.  Patients discharged the day of surgery will not be allowed to drive home.   Name and phone number of your driver:   -  Special instructions:  Review  [place] - Preparing For Surgery.   Please read over the following fact sheets that you were given: Review  [place] - Preparing For Surgery, Coughing and Deep Breathing and Pain Booklet

## 2016-09-02 NOTE — Progress Notes (Addendum)
PCP:Dr. Arvella Nigh  No cardiologist. Echo/stress normal 2015, pt. States no problems since then.  Notified Dr. Eusebio Friendly office of no orders in epic.

## 2016-09-05 ENCOUNTER — Ambulatory Visit: Payer: Self-pay | Admitting: Plastic Surgery

## 2016-09-05 DIAGNOSIS — C50912 Malignant neoplasm of unspecified site of left female breast: Secondary | ICD-10-CM

## 2016-09-05 DIAGNOSIS — Z171 Estrogen receptor negative status [ER-]: Principal | ICD-10-CM

## 2016-09-05 NOTE — H&P (Signed)
[name] is an 51 y.o. female.   Chief Complaint: breast cancer HPI: The patient is a 51 y.o. yrs old wf here for evaluation and second opinion for breast reconstruction.  She was diagnosed with LEFT ductal carcinoma in situ, ER/PR negative January 2018 .  She had a mammogram in November which was negative.  She had an MRI in January which showed 2 masses in the LEFT breast 5.2 x 2.4 x 1 cm in the lower outer quadrant and 1.6 x 1.4 x 0.9 cm in the anterior inferior portion.  Biopsies on 1/8 showed high-grade ductal carcinoma in situ.  She has seen Dr. Marlou Starks and is considering bilateral mastectomies.  She had left breast lobular carcinoma in situ several years ago but did not have radiation.  She is 5 feet 7 inches tall, weight 197 pounds.  Preop bra= 38 DD.  She had a list of questions we reviewed and she explained that her busty look is part of her identity.  Her breasts are large and she has grade III ptosis bilaterally.  The previous scar is well healed.  No masses palpated and no lymph nodes enlarged.    Past Medical History:  Diagnosis Date  . Abnormal perimenopausal bleeding   . Chronic migraine   . Complication of anesthesia    woke up agitated after gum surgery/  post-op panic attack 2014 w/ breast lumpectomy  . Complication of anesthesia    clinches her teeth  . History of exercise intolerance 03/21/2014   normal , no ischemia  . History of panic attacks    POST OP SURGERY  . Hyperlipidemia   . Hypothyroidism   . Lobular breast cancer, left [facility]) ONCOLOGIST-  DR Marin Olp-- no recurrence   dx  11/ 2014  Stage 0 (Tis, Nx, M0)  lobular carcinoma in situ-- s/p  left breast lumpectomy 06-20-2013-- no chemo or radiation--  currently on aromasin therapy  . Wears glasses     Past Surgical History:  Procedure Laterality Date  . BREAST LUMPECTOMY WITH NEEDLE LOCALIZATION Left 06/20/2013   Procedure: BREAST LUMPECTOMY WITH NEEDLE LOCALIZATION;  Surgeon: Merrie Roof, MD;  Location:  [place];  Service: General;  Laterality: Left;  . D & C HYSTERECTOMY W/ RESECTION POLYPS    . GUM SURGERY  1997  approx   SEDATION  . LAPAROSCOPIC VAGINAL HYSTERECTOMY WITH SALPINGO OOPHORECTOMY Bilateral 06/17/2016   Procedure: LAPAROSCOPIC ASSISTED VAGINAL HYSTERECTOMY WITH SALPINGO OOPHORECTOMY;  Surgeon: Arvella Nigh, MD;  Location: [place];  Service: Gynecology;  Laterality: Bilateral;  need bed  . TRANSTHORACIC ECHOCARDIOGRAM  03/21/2014   grade 1 diastolic dysfunction, ef 42-70%/  mild PR/  trivial TR    Family History  Problem Relation Age of Onset  . Breast cancer Mother 32  . Heart disease Mother     Atrial fibrillation  . Melanoma Father 11  . Breast cancer Sister 31    ER+/her2-; onco score = 8  . Cancer Maternal Aunt     oral cancer; smoker  . Head & neck cancer Paternal Uncle     smoker  . Breast cancer Cousin     dx in her late 63s-60s   Social History:  reports that she has never smoked. She has never used smokeless tobacco. She reports that she does not drink alcohol or use drugs.  Allergies:  Allergies  Allergen Reactions  . Penicillins Rash    Has patient had a PCN reaction causing immediate  rash, facial/tongue/throat swelling, SOB or lightheadedness with hypotension: No. Rash not immediate.  Has patient had a PCN reaction causing severe rash involving mucus membranes or skin necrosis: No Has patient had a PCN reaction that required hospitalization No Has patient had a PCN reaction occurring within the last 10 years: No If all of the above answers are "NO", then may proceed with Cephalosporin use.      (Not in a hospital admission)  No results found for this or any previous visit (from the past 48 hour(s)). No results found.  Review of Systems  Constitutional: Negative.   HENT: Negative.   Eyes: Negative.   Respiratory: Negative.   Cardiovascular: Negative.   Gastrointestinal: Negative.   Genitourinary:  Negative.   Musculoskeletal: Negative.   Skin: Negative.   Neurological: Negative.   Psychiatric/Behavioral: Negative.     Last menstrual period 02/01/2014. Physical Exam  Constitutional: She is oriented to person, place, and time. She appears well-developed and well-nourished.  HENT:  Head: Normocephalic and atraumatic.  Eyes: EOM are normal. Pupils are equal, round, and reactive to light.  Cardiovascular: Normal rate.   Respiratory: Effort normal. No respiratory distress.  GI: She exhibits no distension.  Musculoskeletal: Normal range of motion. She exhibits no edema.  Neurological: She is alert and oriented to person, place, and time.  Skin: Skin is warm. No erythema.  Psychiatric: She has a normal mood and affect. Her behavior is normal. Judgment and thought content normal.     Assessment/Plan Once all reconstruction options were presented, a focused discussion was had regarding the patient's suitability for each of these procedures.  She has decided on immediate implant based reconstruction.  We reviewed in detail that I can't make them look like they do now.  The goal is as described above, symmetry in clothing and she will not be as large as she is now.     Wallace Going, DO 09/05/2016, 12:34 PM

## 2016-09-07 MED ORDER — VANCOMYCIN HCL 10 G IV SOLR
1500.0000 mg | INTRAVENOUS | Status: AC
  Administered 2016-09-08: 1500 mg via INTRAVENOUS
  Filled 2016-09-07: qty 1500

## 2016-09-08 ENCOUNTER — Encounter (HOSPITAL_COMMUNITY): Payer: Self-pay | Admitting: *Deleted

## 2016-09-08 ENCOUNTER — Encounter (HOSPITAL_COMMUNITY)
Admission: AD | Disposition: A | Discharge status: Home / Self Care | Payer: Self-pay | Source: Ambulatory Visit | Attending: Plastic Surgery

## 2016-09-08 ENCOUNTER — Ambulatory Visit (HOSPITAL_COMMUNITY): Payer: BC Managed Care – PPO | Admitting: Certified Registered"

## 2016-09-08 ENCOUNTER — Observation Stay (HOSPITAL_COMMUNITY)
Admission: AD | Admit: 2016-09-08 | Discharge: 2016-09-09 | DRG: 581 | Disposition: A | Discharge status: Home / Self Care | Payer: BC Managed Care – PPO | Source: Ambulatory Visit | Attending: Plastic Surgery | Admitting: Plastic Surgery

## 2016-09-08 ENCOUNTER — Ambulatory Visit (HOSPITAL_COMMUNITY)
Admission: RE | Admit: 2016-09-08 | Discharge: 2016-09-08 | Disposition: A | Discharge status: Home / Self Care | Payer: BC Managed Care – PPO | Source: Ambulatory Visit | Attending: General Surgery | Admitting: General Surgery

## 2016-09-08 DIAGNOSIS — I739 Peripheral vascular disease, unspecified: Secondary | ICD-10-CM | POA: Insufficient documentation

## 2016-09-08 DIAGNOSIS — C50919 Malignant neoplasm of unspecified site of unspecified female breast: Secondary | ICD-10-CM | POA: Diagnosis present

## 2016-09-08 DIAGNOSIS — D0512 Intraductal carcinoma in situ of left breast: Principal | ICD-10-CM | POA: Insufficient documentation

## 2016-09-08 DIAGNOSIS — Z880 Allergy status to penicillin: Secondary | ICD-10-CM | POA: Diagnosis not present

## 2016-09-08 DIAGNOSIS — C50912 Malignant neoplasm of unspecified site of left female breast: Secondary | ICD-10-CM | POA: Diagnosis present

## 2016-09-08 DIAGNOSIS — Z803 Family history of malignant neoplasm of breast: Secondary | ICD-10-CM | POA: Insufficient documentation

## 2016-09-08 DIAGNOSIS — Z90710 Acquired absence of both cervix and uterus: Secondary | ICD-10-CM | POA: Diagnosis not present

## 2016-09-08 DIAGNOSIS — Z171 Estrogen receptor negative status [ER-]: Secondary | ICD-10-CM

## 2016-09-08 DIAGNOSIS — Z4001 Encounter for prophylactic removal of breast: Secondary | ICD-10-CM | POA: Diagnosis not present

## 2016-09-08 DIAGNOSIS — E039 Hypothyroidism, unspecified: Secondary | ICD-10-CM | POA: Insufficient documentation

## 2016-09-08 DIAGNOSIS — Z79899 Other long term (current) drug therapy: Secondary | ICD-10-CM | POA: Diagnosis not present

## 2016-09-08 HISTORY — PX: MASTECTOMY W/ SENTINEL NODE BIOPSY: SHX2001

## 2016-09-08 HISTORY — PX: TISSUE EXPANDER PLACEMENT: SHX2530

## 2016-09-08 SURGERY — MASTECTOMY WITH SENTINEL LYMPH NODE BIOPSY
Anesthesia: General | Site: Breast | Laterality: Bilateral

## 2016-09-08 MED ORDER — ONDANSETRON HCL 4 MG/2ML IJ SOLN: 4.0000 mg | Freq: Four times a day (QID) | INTRAMUSCULAR | Status: DC | PRN

## 2016-09-08 MED ORDER — MIDAZOLAM HCL 2 MG/2ML IJ SOLN
1.5000 mg | Freq: Once | INTRAMUSCULAR | Status: AC
  Administered 2016-09-08: 1.5 mg via INTRAVENOUS

## 2016-09-08 MED ORDER — SUGAMMADEX SODIUM 200 MG/2ML IV SOLN
INTRAVENOUS | Status: AC
  Filled 2016-09-08: qty 2

## 2016-09-08 MED ORDER — ONDANSETRON HCL 4 MG/2ML IJ SOLN
4.0000 mg | Freq: Four times a day (QID) | INTRAMUSCULAR | Status: DC | PRN
Start: 2016-09-08 — End: 2016-09-09
  Administered 2016-09-08: 4 mg via INTRAVENOUS
  Filled 2016-09-08: qty 2

## 2016-09-08 MED ORDER — POLYETHYLENE GLYCOL 3350 17 G PO PACK: 17.0000 g | PACK | Freq: Every day | ORAL | Status: DC | PRN

## 2016-09-08 MED ORDER — FENTANYL CITRATE (PF) 100 MCG/2ML IJ SOLN
INTRAMUSCULAR | Status: AC
  Administered 2016-09-08: 75 ug via INTRAVENOUS
  Filled 2016-09-08: qty 2

## 2016-09-08 MED ORDER — CELECOXIB 200 MG PO CAPS
400.0000 mg | ORAL_CAPSULE | ORAL | Status: AC
  Administered 2016-09-08: 400 mg via ORAL
  Filled 2016-09-08: qty 2

## 2016-09-08 MED ORDER — DIAZEPAM 2 MG PO TABS: 2.0000 mg | ORAL_TABLET | Freq: Two times a day (BID) | ORAL | Status: DC | PRN

## 2016-09-08 MED ORDER — MORPHINE SULFATE (PF) 2 MG/ML IV SOLN
1.0000 mg | INTRAVENOUS | Status: DC | PRN
  Administered 2016-09-08: 2 mg via INTRAVENOUS
  Filled 2016-09-08: qty 1

## 2016-09-08 MED ORDER — MAGNESIUM OXIDE 400 (241.3 MG) MG PO TABS
400.0000 mg | ORAL_TABLET | Freq: Every day | ORAL | Status: DC
  Administered 2016-09-08 – 2016-09-09 (×2): 400 mg via ORAL
  Filled 2016-09-08 (×2): qty 1

## 2016-09-08 MED ORDER — GABAPENTIN 300 MG PO CAPS
300.0000 mg | ORAL_CAPSULE | ORAL | Status: AC
  Administered 2016-09-08: 300 mg via ORAL
  Filled 2016-09-08: qty 1

## 2016-09-08 MED ORDER — LIDOCAINE 2% (20 MG/ML) 5 ML SYRINGE
INTRAMUSCULAR | Status: AC
  Filled 2016-09-08: qty 5

## 2016-09-08 MED ORDER — LACTATED RINGERS IV SOLN
Freq: Once | INTRAVENOUS | Status: AC
  Administered 2016-09-08: 08:00:00 via INTRAVENOUS

## 2016-09-08 MED ORDER — PANTOPRAZOLE SODIUM 40 MG IV SOLR
40.0000 mg | Freq: Every day | INTRAVENOUS | Status: DC
  Administered 2016-09-08: 40 mg via INTRAVENOUS
  Filled 2016-09-08: qty 40

## 2016-09-08 MED ORDER — NAPROXEN 250 MG PO TABS: 500.0000 mg | ORAL_TABLET | Freq: Two times a day (BID) | ORAL | Status: DC | PRN

## 2016-09-08 MED ORDER — VITAMIN D 1000 UNITS PO TABS
1000.0000 [IU] | ORAL_TABLET | Freq: Every day | ORAL | Status: DC
  Administered 2016-09-08 – 2016-09-09 (×2): 1000 [IU] via ORAL
  Filled 2016-09-08 (×2): qty 1

## 2016-09-08 MED ORDER — CHLORHEXIDINE GLUCONATE CLOTH 2 % EX PADS: 6.0000 | MEDICATED_PAD | Freq: Once | CUTANEOUS | Status: DC

## 2016-09-08 MED ORDER — HYDROMORPHONE HCL 1 MG/ML IJ SOLN
INTRAMUSCULAR | Status: AC
Start: 2016-09-08 — End: 2016-09-09
  Filled 2016-09-08: qty 1

## 2016-09-08 MED ORDER — SODIUM CHLORIDE 0.9 % IR SOLN
Status: DC | PRN
  Administered 2016-09-08: 1000 mL

## 2016-09-08 MED ORDER — KCL IN DEXTROSE-NACL 20-5-0.45 MEQ/L-%-% IV SOLN: INTRAVENOUS | Status: DC

## 2016-09-08 MED ORDER — HEPARIN SODIUM (PORCINE) 5000 UNIT/ML IJ SOLN
5000.0000 [IU] | Freq: Three times a day (TID) | INTRAMUSCULAR | Status: DC
  Administered 2016-09-09: 5000 [IU] via SUBCUTANEOUS
  Filled 2016-09-08: qty 1

## 2016-09-08 MED ORDER — SODIUM CHLORIDE 0.9 % IJ SOLN
INTRAMUSCULAR | Status: AC
  Filled 2016-09-08: qty 10

## 2016-09-08 MED ORDER — PROPRANOLOL HCL 10 MG PO TABS
20.0000 mg | ORAL_TABLET | Freq: Two times a day (BID) | ORAL | Status: DC
  Filled 2016-09-08 (×2): qty 2

## 2016-09-08 MED ORDER — METHOCARBAMOL 500 MG PO TABS
500.0000 mg | ORAL_TABLET | Freq: Four times a day (QID) | ORAL | Status: DC | PRN
  Administered 2016-09-08 – 2016-09-09 (×2): 500 mg via ORAL
  Filled 2016-09-08 (×2): qty 1

## 2016-09-08 MED ORDER — PROPOFOL 10 MG/ML IV BOLUS
INTRAVENOUS | Status: AC
  Filled 2016-09-08: qty 20

## 2016-09-08 MED ORDER — DIPHENHYDRAMINE HCL 50 MG/ML IJ SOLN: 12.5000 mg | Freq: Four times a day (QID) | INTRAMUSCULAR | Status: DC | PRN

## 2016-09-08 MED ORDER — ONDANSETRON 4 MG PO TBDP: 4.0000 mg | ORAL_TABLET | Freq: Four times a day (QID) | ORAL | Status: DC | PRN

## 2016-09-08 MED ORDER — LEVOTHYROXINE SODIUM 112 MCG PO TABS
112.0000 ug | ORAL_TABLET | Freq: Every day | ORAL | Status: DC
  Administered 2016-09-09: 112 ug via ORAL
  Filled 2016-09-08: qty 1

## 2016-09-08 MED ORDER — POLYMYXIN B SULFATE 500000 UNITS IJ SOLR
INTRAMUSCULAR | Status: DC | PRN
  Administered 2016-09-08: 500 mL

## 2016-09-08 MED ORDER — SENNOSIDES-DOCUSATE SODIUM 8.6-50 MG PO TABS
ORAL_TABLET | Freq: Every day | ORAL | Status: DC
  Administered 2016-09-08 – 2016-09-09 (×2): 1 via ORAL
  Filled 2016-09-08 (×2): qty 1

## 2016-09-08 MED ORDER — FENTANYL CITRATE (PF) 100 MCG/2ML IJ SOLN
INTRAMUSCULAR | Status: AC
  Filled 2016-09-08: qty 2

## 2016-09-08 MED ORDER — DIPHENHYDRAMINE HCL 12.5 MG/5ML PO ELIX: 12.5000 mg | ORAL_SOLUTION | Freq: Four times a day (QID) | ORAL | Status: DC | PRN

## 2016-09-08 MED ORDER — FENTANYL CITRATE (PF) 100 MCG/2ML IJ SOLN
75.0000 ug | Freq: Once | INTRAMUSCULAR | Status: AC
  Administered 2016-09-08: 75 ug via INTRAVENOUS

## 2016-09-08 MED ORDER — HYDROCODONE-ACETAMINOPHEN 5-325 MG PO TABS: 1.0000 | ORAL_TABLET | ORAL | Status: DC | PRN

## 2016-09-08 MED ORDER — ONDANSETRON HCL 4 MG/2ML IJ SOLN
4.0000 mg | Freq: Once | INTRAMUSCULAR | Status: AC
  Administered 2016-09-08: 4 mg via INTRAVENOUS
  Filled 2016-09-08 (×2): qty 2

## 2016-09-08 MED ORDER — EPHEDRINE SULFATE 50 MG/ML IJ SOLN
INTRAMUSCULAR | Status: DC | PRN
  Administered 2016-09-08: 5 mg via INTRAVENOUS
  Administered 2016-09-08: 10 mg via INTRAVENOUS

## 2016-09-08 MED ORDER — SUCCINYLCHOLINE CHLORIDE 200 MG/10ML IV SOSY
PREFILLED_SYRINGE | INTRAVENOUS | Status: AC
  Filled 2016-09-08: qty 10

## 2016-09-08 MED ORDER — HYDROCODONE-ACETAMINOPHEN 5-325 MG PO TABS
1.0000 | ORAL_TABLET | ORAL | Status: DC | PRN
  Administered 2016-09-08: 2 via ORAL
  Filled 2016-09-08 (×2): qty 2

## 2016-09-08 MED ORDER — ACETAMINOPHEN 500 MG PO TABS
1000.0000 mg | ORAL_TABLET | Freq: Four times a day (QID) | ORAL | Status: DC
  Administered 2016-09-08 – 2016-09-09 (×3): 1000 mg via ORAL
  Filled 2016-09-08 (×3): qty 2

## 2016-09-08 MED ORDER — VITAMIN B-12 1000 MCG PO TABS
500.0000 ug | ORAL_TABLET | Freq: Every day | ORAL | Status: DC
  Administered 2016-09-08 – 2016-09-09 (×2): 500 ug via ORAL
  Filled 2016-09-08 (×2): qty 1

## 2016-09-08 MED ORDER — LACTATED RINGERS IV SOLN
INTRAVENOUS | Status: DC | PRN
  Administered 2016-09-08 (×2): via INTRAVENOUS

## 2016-09-08 MED ORDER — MIDAZOLAM HCL 2 MG/2ML IJ SOLN
INTRAMUSCULAR | Status: DC | PRN
  Administered 2016-09-08: 1 mg via INTRAVENOUS

## 2016-09-08 MED ORDER — MIDAZOLAM HCL 2 MG/2ML IJ SOLN
INTRAMUSCULAR | Status: AC
  Administered 2016-09-08: 1.5 mg via INTRAVENOUS
  Filled 2016-09-08: qty 2

## 2016-09-08 MED ORDER — PROPOFOL 10 MG/ML IV BOLUS
INTRAVENOUS | Status: DC | PRN
  Administered 2016-09-08: 60 mg via INTRAVENOUS
  Administered 2016-09-08: 100 mg via INTRAVENOUS
  Administered 2016-09-08: 40 mg via INTRAVENOUS

## 2016-09-08 MED ORDER — HYDROMORPHONE HCL 2 MG/ML IJ SOLN: 1.0000 mg | INTRAMUSCULAR | Status: DC | PRN

## 2016-09-08 MED ORDER — PROMETHAZINE HCL 25 MG/ML IJ SOLN
6.2500 mg | INTRAMUSCULAR | Status: DC | PRN
Start: 2016-09-08 — End: 2016-09-08

## 2016-09-08 MED ORDER — ACETAMINOPHEN 500 MG PO TABS
1000.0000 mg | ORAL_TABLET | ORAL | Status: AC
  Administered 2016-09-08: 1000 mg via ORAL
  Filled 2016-09-08: qty 2

## 2016-09-08 MED ORDER — ONDANSETRON HCL 4 MG/2ML IJ SOLN
INTRAMUSCULAR | Status: DC | PRN
  Administered 2016-09-08: 4 mg via INTRAVENOUS

## 2016-09-08 MED ORDER — PHENYLEPHRINE HCL 10 MG/ML IJ SOLN
INTRAMUSCULAR | Status: DC | PRN
  Administered 2016-09-08 (×3): 80 ug via INTRAVENOUS
  Administered 2016-09-08: 40 ug via INTRAVENOUS

## 2016-09-08 MED ORDER — HYDROMORPHONE HCL 1 MG/ML IJ SOLN
0.2500 mg | INTRAMUSCULAR | Status: DC | PRN
  Administered 2016-09-08 (×4): 0.5 mg via INTRAVENOUS

## 2016-09-08 MED ORDER — FENTANYL CITRATE (PF) 100 MCG/2ML IJ SOLN
INTRAMUSCULAR | Status: DC | PRN
  Administered 2016-09-08: 100 ug via INTRAVENOUS
  Administered 2016-09-08 (×2): 50 ug via INTRAVENOUS

## 2016-09-08 MED ORDER — METHYLENE BLUE 0.5 % INJ SOLN
INTRAVENOUS | Status: AC
  Filled 2016-09-08: qty 10

## 2016-09-08 MED ORDER — BISACODYL 10 MG RE SUPP: 10.0000 mg | Freq: Every day | RECTAL | Status: DC | PRN

## 2016-09-08 MED ORDER — ROCURONIUM BROMIDE 10 MG/ML (PF) SYRINGE
PREFILLED_SYRINGE | INTRAVENOUS | Status: DC | PRN
  Administered 2016-09-08: 10 mg via INTRAVENOUS
  Administered 2016-09-08: 50 mg via INTRAVENOUS
  Administered 2016-09-08: 20 mg via INTRAVENOUS

## 2016-09-08 MED ORDER — SENNA 8.6 MG PO TABS
1.0000 | ORAL_TABLET | Freq: Two times a day (BID) | ORAL | Status: DC
  Administered 2016-09-08 – 2016-09-09 (×3): 8.6 mg via ORAL
  Filled 2016-09-08 (×3): qty 1

## 2016-09-08 MED ORDER — CIPROFLOXACIN IN D5W 400 MG/200ML IV SOLN
400.0000 mg | INTRAVENOUS | Status: AC
  Administered 2016-09-08: 400 mg via INTRAVENOUS
  Filled 2016-09-08: qty 200

## 2016-09-08 MED ORDER — PROMETHAZINE HCL 25 MG/ML IJ SOLN
12.5000 mg | INTRAMUSCULAR | Status: DC | PRN
  Administered 2016-09-08: 12.5 mg via INTRAVENOUS
  Filled 2016-09-08: qty 1

## 2016-09-08 MED ORDER — MIDAZOLAM HCL 2 MG/2ML IJ SOLN
INTRAMUSCULAR | Status: AC
  Filled 2016-09-08: qty 2

## 2016-09-08 MED ORDER — RIBOFLAVIN 100 MG PO CAPS: 100.0000 mg | ORAL_CAPSULE | Freq: Every day | ORAL | Status: DC

## 2016-09-08 MED ORDER — CIPROFLOXACIN IN D5W 400 MG/200ML IV SOLN
400.0000 mg | Freq: Two times a day (BID) | INTRAVENOUS | Status: DC
  Administered 2016-09-08 – 2016-09-09 (×2): 400 mg via INTRAVENOUS
  Filled 2016-09-08 (×3): qty 200

## 2016-09-08 MED ORDER — 0.9 % SODIUM CHLORIDE (POUR BTL) OPTIME
TOPICAL | Status: DC | PRN
  Administered 2016-09-08 (×2): 1000 mL

## 2016-09-08 MED ORDER — LIDOCAINE 2% (20 MG/ML) 5 ML SYRINGE
INTRAMUSCULAR | Status: DC | PRN
  Administered 2016-09-08: 60 mg via INTRAVENOUS

## 2016-09-08 MED ORDER — ROCURONIUM BROMIDE 50 MG/5ML IV SOSY
PREFILLED_SYRINGE | INTRAVENOUS | Status: AC
  Filled 2016-09-08: qty 5

## 2016-09-08 MED ORDER — TECHNETIUM TC 99M SULFUR COLLOID FILTERED
1.0000 | Freq: Once | INTRAVENOUS | Status: AC | PRN
  Administered 2016-09-08: 1 via INTRADERMAL

## 2016-09-08 MED ORDER — KCL IN DEXTROSE-NACL 20-5-0.9 MEQ/L-%-% IV SOLN
INTRAVENOUS | Status: DC
  Administered 2016-09-08 – 2016-09-09 (×2): via INTRAVENOUS
  Filled 2016-09-08 (×2): qty 1000

## 2016-09-08 MED ORDER — BUPIVACAINE-EPINEPHRINE (PF) 0.5% -1:200000 IJ SOLN
INTRAMUSCULAR | Status: DC | PRN
  Administered 2016-09-08: 40 mL

## 2016-09-08 MED ORDER — DEXTROSE 5 % IV SOLN
INTRAVENOUS | Status: DC | PRN
  Administered 2016-09-08: 25 ug/min via INTRAVENOUS

## 2016-09-08 MED ORDER — VITAMIN C 500 MG PO TABS
500.0000 mg | ORAL_TABLET | Freq: Two times a day (BID) | ORAL | Status: DC
  Administered 2016-09-08 – 2016-09-09 (×3): 500 mg via ORAL
  Filled 2016-09-08 (×3): qty 1

## 2016-09-08 MED ORDER — HYDROMORPHONE HCL 1 MG/ML IJ SOLN
INTRAMUSCULAR | Status: AC
  Filled 2016-09-08: qty 1

## 2016-09-08 MED ORDER — SUGAMMADEX SODIUM 200 MG/2ML IV SOLN
INTRAVENOUS | Status: DC | PRN
  Administered 2016-09-08: 200 mg via INTRAVENOUS

## 2016-09-08 MED ORDER — DEXAMETHASONE SODIUM PHOSPHATE 10 MG/ML IJ SOLN
10.0000 mg | Freq: Once | INTRAMUSCULAR | Status: AC
  Administered 2016-09-08: 10 mg via INTRAVENOUS
  Filled 2016-09-08 (×2): qty 1

## 2016-09-08 SURGICAL SUPPLY — 73 items
ADH SKN CLS APL DERMABOND .7 (GAUZE/BANDAGES/DRESSINGS) ×2
APPLIER CLIP 9.375 MED OPEN (MISCELLANEOUS) ×2
APR CLP MED 9.3 20 MLT OPN (MISCELLANEOUS) ×1
BAG DECANTER FOR FLEXI CONT (MISCELLANEOUS) ×2 IMPLANT
BINDER BREAST XLRG (GAUZE/BANDAGES/DRESSINGS) ×1 IMPLANT
BIOPATCH RED 1 DISK 7.0 (GAUZE/BANDAGES/DRESSINGS) ×2 IMPLANT
BLADE PHOTON ILLUMINATED (MISCELLANEOUS) ×1 IMPLANT
CANISTER SUCTION 2500CC (MISCELLANEOUS) ×2 IMPLANT
CHLORAPREP W/TINT 26ML (MISCELLANEOUS) ×4 IMPLANT
CLIP APPLIE 9.375 MED OPEN (MISCELLANEOUS) ×1 IMPLANT
CONT SPEC 4OZ CLIKSEAL STRL BL (MISCELLANEOUS) ×1 IMPLANT
COVER PROBE W GEL 5X96 (DRAPES) ×2 IMPLANT
COVER SURGICAL LIGHT HANDLE (MISCELLANEOUS) ×3 IMPLANT
DERMABOND ADVANCED (GAUZE/BANDAGES/DRESSINGS) ×2
DERMABOND ADVANCED .7 DNX12 (GAUZE/BANDAGES/DRESSINGS) ×2 IMPLANT
DRAIN CHANNEL 19F RND (DRAIN) ×3 IMPLANT
DRAPE ORTHO SPLIT 77X108 STRL (DRAPES) ×4
DRAPE PROXIMA HALF (DRAPES) ×4 IMPLANT
DRAPE SURG 17X23 STRL (DRAPES) ×8 IMPLANT
DRAPE SURG ORHT 6 SPLT 77X108 (DRAPES) ×2 IMPLANT
DRAPE WARM FLUID 44X44 (DRAPE) ×2 IMPLANT
DRSG PAD ABDOMINAL 8X10 ST (GAUZE/BANDAGES/DRESSINGS) ×4 IMPLANT
ELECT BLADE 4.0 EZ CLEAN MEGAD (MISCELLANEOUS) ×2
ELECT CAUTERY BLADE 6.4 (BLADE) ×2 IMPLANT
ELECT REM PT RETURN 9FT ADLT (ELECTROSURGICAL) ×2
ELECTRODE BLDE 4.0 EZ CLN MEGD (MISCELLANEOUS) ×1 IMPLANT
ELECTRODE REM PT RTRN 9FT ADLT (ELECTROSURGICAL) ×1 IMPLANT
EVACUATOR SILICONE 100CC (DRAIN) ×3 IMPLANT
GAUZE SPONGE 4X4 12PLY STRL (GAUZE/BANDAGES/DRESSINGS) ×1 IMPLANT
GLOVE BIO SURGEON STRL SZ 6 (GLOVE) ×1 IMPLANT
GLOVE BIO SURGEON STRL SZ 6.5 (GLOVE) ×3 IMPLANT
GLOVE BIO SURGEON STRL SZ7 (GLOVE) ×1 IMPLANT
GLOVE BIO SURGEON STRL SZ7.5 (GLOVE) ×2 IMPLANT
GLOVE BIOGEL PI IND STRL 7.0 (GLOVE) IMPLANT
GLOVE BIOGEL PI INDICATOR 7.0 (GLOVE) ×3
GLOVE SURG SS PI 6.5 STRL IVOR (GLOVE) ×4 IMPLANT
GOWN STRL REUS W/ TWL LRG LVL3 (GOWN DISPOSABLE) ×4 IMPLANT
GOWN STRL REUS W/TWL LRG LVL3 (GOWN DISPOSABLE) ×14
GRAFT FLEX HD 4X16 THICK (Tissue Mesh) ×2 IMPLANT
IMPL BREAST TIS EXP M 350CC (Breast) IMPLANT
IMPLANT BREAST TIS EXP M 350CC (Breast) ×4 IMPLANT
KIT BASIN OR (CUSTOM PROCEDURE TRAY) ×2 IMPLANT
KIT ROOM TURNOVER OR (KITS) ×2 IMPLANT
LIGHT WAVEGUIDE WIDE FLAT (MISCELLANEOUS) ×1 IMPLANT
NDL 18GX1X1/2 (RX/OR ONLY) (NEEDLE) IMPLANT
NDL FILTER BLUNT 18X1 1/2 (NEEDLE) IMPLANT
NDL HYPO 25GX1X1/2 BEV (NEEDLE) IMPLANT
NEEDLE 18GX1X1/2 (RX/OR ONLY) (NEEDLE) IMPLANT
NEEDLE FILTER BLUNT 18X 1/2SAF (NEEDLE)
NEEDLE FILTER BLUNT 18X1 1/2 (NEEDLE) IMPLANT
NEEDLE HYPO 25GX1X1/2 BEV (NEEDLE) IMPLANT
NS IRRIG 1000ML POUR BTL (IV SOLUTION) ×3 IMPLANT
PACK GENERAL/GYN (CUSTOM PROCEDURE TRAY) ×4 IMPLANT
PAD ARMBOARD 7.5X6 YLW CONV (MISCELLANEOUS) ×2 IMPLANT
SET ASEPTIC TRANSFER (MISCELLANEOUS) ×2 IMPLANT
SPECIMEN JAR X LARGE (MISCELLANEOUS) ×3 IMPLANT
SUT ETHILON 3 0 FSL (SUTURE) ×1 IMPLANT
SUT MNCRL AB 3-0 PS2 18 (SUTURE) ×2 IMPLANT
SUT MNCRL AB 4-0 PS2 18 (SUTURE) ×2 IMPLANT
SUT MON AB 5-0 PS2 18 (SUTURE) ×4 IMPLANT
SUT PDS AB 2-0 CT1 27 (SUTURE) ×9 IMPLANT
SUT PDS AB 3-0 SH 27 (SUTURE) IMPLANT
SUT SILK 3 0 SH 30 (SUTURE) ×1 IMPLANT
SUT SILK 4 0 PS 2 (SUTURE) ×2 IMPLANT
SUT VIC AB 3-0 SH 18 (SUTURE) ×2 IMPLANT
SUT VIC AB 3-0 SH 27 (SUTURE)
SUT VIC AB 3-0 SH 27X BRD (SUTURE) ×3 IMPLANT
SUT VICRYL 4-0 PS2 18IN ABS (SUTURE) ×2 IMPLANT
SYR CONTROL 10ML LL (SYRINGE) IMPLANT
TOWEL OR 17X24 6PK STRL BLUE (TOWEL DISPOSABLE) ×2 IMPLANT
TOWEL OR 17X26 10 PK STRL BLUE (TOWEL DISPOSABLE) ×2 IMPLANT
TRAY FOLEY CATH 14FRSI W/METER (CATHETERS) ×1 IMPLANT
YANKAUER SUCT BULB TIP NO VENT (SUCTIONS) ×1 IMPLANT

## 2016-09-08 NOTE — Op Note (Signed)
Op report    DATE OF OPERATION:  09/08/2016  LOCATION: [facility] main Operating Room inpatient  SURGICAL DIVISION: Plastic Surgery  PREOPERATIVE DIAGNOSES:  1. Left Breast cancer.    POSTOPERATIVE DIAGNOSES:  1. Left Breast cancer.   PROCEDURE:  1. Bilateral immediate breast reconstruction with placement of Acellular Dermal Matrix and tissue expanders.  SURGEON: Claire Sanger Dillingham, DO  ASSISTANT: Shawn Rayburn, PA  ANESTHESIA:  General.   COMPLICATIONS: None.   IMPLANTS: Left - Mentor 350 cc with 200 cc of saline placed Right - Mentor 350 cc. With 200 cc of saline placed Acellular Dermal Matrix 4 x 16 cm two  INDICATIONS FOR PROCEDURE:  The patient, [name], is a 51 y.o. female born on Jan 27, 1966, is here for  immediate first stage breast reconstruction with placement of bilateral tissue expander and Acellular dermal matrix. MRN: [id number]  CONSENT:  Informed consent was obtained directly from the patient. Risks, benefits and alternatives were fully discussed. Specific risks including but not limited to bleeding, infection, hematoma, seroma, scarring, pain, implant infection, implant extrusion, capsular contracture, asymmetry, wound healing problems, and need for further surgery were all discussed. The patient did have an ample opportunity to have her questions answered to her satisfaction.   DESCRIPTION OF PROCEDURE:  The patient was taken to the operating room by the general surgery team. SCDs were placed and IV antibiotics were given. The patient's chest was prepped and draped in a sterile fashion. A time out was performed and the implants to be used were identified.  bilateral mastectomies were performed.  Once the general surgery team had completed their portion of the case the patient was rendered to the plastic and reconstructive surgery team.  Right: The pectoralis major muscle was lifted from the chest wall with release of the lateral edge and lateral  inframammary fold.  The pocket was irrigated with antibiotic solution and hemostasis was achieved with electrocautery.  The ADM was then prepared according to the manufacture guidelines and slits placed to help with postoperative fluid management.  The ADM was then sutured to the inferior and lateral edge of the inframammary fold with 2-0 PDS starting with an interrupted stitch and then a running stitch.  The lateral portion was sutured to with interrupted sutures after the expander was placed.  The expander was prepared according to the manufacture guidelines, the air evacuated and then it was placed under the ADM and pectoralis major muscle.  The inferior and lateral tabs were used to secure the expander to the chest wall with 2-0 PDS.  The drain was placed at the inframammary fold over the ADM and secured to the skin with 3-0 Silk.    Left:  The pectoralis major muscle was lifted from the chest wall with release of the lateral edge and lateral inframammary fold.  The pocket was irrigated with antibiotic solution and hemostasis was achieved with electrocautery.  The ADM was then prepared according to the manufacture guidelines and slits placed to help with postoperative fluid management.  The ADM was then sutured to the inferior and lateral edge of the inframammary fold with 2-0 PDS starting with an interrupted stitch and then a running stitch.  The lateral portion was sutured to with interrupted sutures after the expander was placed.  The expander was prepared according to the manufacture guidelines, the air evacuated and then it was placed under the ADM and pectoralis major muscle.  The inferior and lateral tabs were used to secure the expander to the  chest wall with 2-0 PDS.  The drain was placed at the inframammary fold over the ADM and secured to the skin with 3-0 Silk.    The deep layers were closed with 3-0 Monocryl followed by 4-0 Monocryl.  The skin was closed with 5-0 Monocryl and then dermabond was  applied.  The ABDs and breast binder were placed.  The patient tolerated the procedure well and there were no complications.  The patient was allowed to wake from anesthesia and taken to the recovery room in satisfactory condition.

## 2016-09-08 NOTE — Interval H&P Note (Signed)
History and Physical Interval Note:  09/08/2016 8:55 AM  Analyah R Mceachern  has presented today for surgery, with the diagnosis of LEFT BREAST DCIS AND LCIS  The various methods of treatment have been discussed with the patient and family. After consideration of risks, benefits and other options for treatment, the patient has consented to  Procedure(s): LEFT MASTECTOMY WITH LEFT SENTINEL LYMPH NODE BIOPSY, RIGHT PROPHYLACTIC MASTECTOMY (Bilateral) IMMEDIATE PLACEMENT OF BILATERAL TISSUE EXPANDERS AFTER MASTECTOMIES (Bilateral) as a surgical intervention .  The patient's history has been reviewed, patient examined, no change in status, stable for surgery.  I have reviewed the patient's chart and labs.  Questions were answered to the patient's satisfaction.     TOTH III,PAUL S

## 2016-09-08 NOTE — Op Note (Signed)
09/08/2016  11:34 AM  PATIENT:  [name]  51 y.o. female  PRE-OPERATIVE DIAGNOSIS:  LEFT BREAST DCIS AND LCIS  POST-OPERATIVE DIAGNOSIS:  LEFT BREAST DCIS AND LCIS  PROCEDURE:  Procedure(s): LEFT MASTECTOMY WITH LEFT DEEP AXILLARY SENTINEL LYMPH NODE BIOPSY, RIGHT PROPHYLACTIC MASTECTOMY (Bilateral) IMMEDIATE PLACEMENT OF BILATERAL TISSUE EXPANDERS AFTER MASTECTOMIES (Bilateral)  SURGEON:  Surgeon(s) and Role: Panel 1:    * Jovita Kussmaul, MD - Primary  Panel 2:    * Loel Lofty Dillingham, DO - Primary  PHYSICIAN ASSISTANT:   ASSISTANTS: Jamie Sipsis, RNFA   ANESTHESIA:   general  EBL:  Total I/O In: 1000 [I.V.:1000] Out: 130 [Urine:80; Blood:50]  BLOOD ADMINISTERED:none  DRAINS: none   LOCAL MEDICATIONS USED:  NONE  SPECIMEN:  Source of Specimen:  bilateral breasts and left deep axillary sentinel node  DISPOSITION OF SPECIMEN:  PATHOLOGY  COUNTS:  YES  TOURNIQUET:  * No tourniquets in log *  DICTATION: .Dragon Dictation   After informed consent was obtained patient was brought to the operating room and placed in the supine position on the operating room table. After adequate induction of general anesthesia the patient's bilateral chest, breast, and axillary areas were prepped with ChloraPrep, allowed to dry, and draped in usual sterile manner. An appropriate timeout was performed. Earlier in the day the patient underwent injection of 1 mCi of technetium sulfur colloid in the subareolar position on the left. Attention was first turned to the right breast. An elliptical incision was made with a 10 blade knife around the nipple and areola complex in order to spare the skin. The incision was carried through the skin and subcutaneous tissue sharply with the photon blade. Breast hooks were used to elevate the skin flaps anteriorly towards the ceiling. Thin skin flaps were created circumferentially by dissecting between the breast tissue in the subcutaneous fat. This  dissection was carried all the way to the chest wall. Next the breast was removed from the pectoralis muscle with the pectoralis fascia. Once the breast was completely detached then the breast was marked with a stitch on the lateral skin and sent to pathology for further evaluation. Hemostasis was achieved using the photon blade. The wound was irrigated with saline and packed with a moistened lap sponge. The area was then covered with a sterile blue towel. Attention was then turned to the left breast. A similar elliptical incision was made with a 10 blade knife. The incision was carried through the skin and subcutaneous tissue sharply with the photon blade. Breast hooks were used to elevate the skin flaps anteriorly towards the ceiling. Thin skin flaps were created circumferentially between the breast tissue and the subcutaneous fat. This dissection was carried all the way to the chest wall circumferentially. The breast was then removed from the pectoralis muscle with the pectoralis fascia. Once this was accomplished the breast was marked with a stitch on the lateral skin and sent to pathology for further evaluation. The neoprobe was set to technetium in the left axilla was examined. The neoprobe was used to direct blunt hemostat dissection until a hot lymph node was identified. This lymph node was excised sharply with the photon blade. Ex vivo counts on this node were approximately 4500. No other hot or palpable lymph nodes were identified in the left axilla. The wound was then irrigated with copious amounts of saline and packed with a moistened lap sponge. The skin flaps were healthy and viable. At this point the patient had tolerated  the procedure well. All needle sponge and instrument counts were correct. The operation was then turned over to Dr. Marla Roe for the reconstruction. Her portion of the case will be dictated separately.  PLAN OF CARE: Admit for overnight observation  PATIENT DISPOSITION:  PACU -  hemodynamically stable.   Delay start of Pharmacological VTE agent (>24hrs) due to surgical blood loss or risk of bleeding: no

## 2016-09-08 NOTE — H&P (Addendum)
[name]  Location: [facility] Patient #: [id number] DOB: 1965-11-28 Married / Language: English / Race: White Female   History of Present Illness The patient is a 51 year old female who presents for a follow-up for Breast cancer. 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 She has been on Aromasin for chemoprevention. The recently had a follow-up MRI study which showed 2 lesions in the outer and lower outer left breast. One of these measured 5.5 cm and the other 1.6 cm. 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 She does have a significant family history of breast cancer in her mother and a sister. 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 Calcium Carbonate-Vitamin D (500-400MG -UNIT Tablet, Oral) Active. Vitamin D (2000UNIT Capsule, Oral) Active. Cholestyramine Light (4GM/DOSE Powder, Oral) Active. Krill Oil Omega-3 (300MG  Capsule, Oral) Active. Levothyroxine Sodium (112MCG Tablet, Oral) Active. Multi Vitamin Daily (Oral) Active. Zolpidem Tartrate ER (12.5MG  Tablet ER, Oral) Active. Tamoxifen Citrate (20MG  Tablet, Oral as needed) Active. Synthroid (112MCG Tablet, Oral daily) Active. Hyoscyamine Sulfate (0.125MG  Tab Sublingual, Sublingual daily) Active. Triamcinolone Acetonide (0.1% Cream, External daily) Active. Medications Reconciled    Review of Systems General Not Present- Appetite Loss, Chills, Fatigue, Fever, Night Sweats, Weight Gain and Weight Loss. Skin Not Present- Change in Wart/Mole, Dryness, Hives, Jaundice, New Lesions, Non-Healing Wounds, Rash and Ulcer. HEENT  Present- Wears glasses/contact lenses. Not Present- Earache, Hearing Loss, Hoarseness, Nose Bleed, Oral Ulcers, Ringing in the Ears, Seasonal Allergies, Sinus Pain, Sore Throat, Visual Disturbances and Yellow Eyes. Respiratory Present- Snoring. Not Present- Bloody sputum, Chronic Cough, Difficulty Breathing and Wheezing. Breast Not Present- Breast Mass, Breast Pain, Nipple Discharge and Skin Changes. Cardiovascular Present- Chest Pain. Not Present- Difficulty Breathing Lying Down, Leg Cramps, Palpitations, Rapid Heart Rate, Shortness of Breath and Swelling of Extremities. Gastrointestinal Present- Chronic diarrhea. Not Present- Abdominal Pain, Bloating, Bloody Stool, Change in Bowel Habits, Constipation, Difficulty Swallowing, Excessive gas, Gets full quickly at meals, Hemorrhoids, Indigestion, Nausea, Rectal Pain and Vomiting. Female Genitourinary Not Present- Frequency, Nocturia, Painful Urination, Pelvic Pain and Urgency. Musculoskeletal Not Present- Back Pain, Joint Pain, Joint Stiffness, Muscle Pain, Muscle Weakness and Swelling of Extremities. Neurological Not Present- Decreased Memory, Fainting, Headaches, Numbness, Seizures, Tingling, Tremor, Trouble walking and Weakness. Psychiatric Not Present- Anxiety, Bipolar, Change in Sleep Pattern, Depression, Fearful and Frequent crying. Endocrine Present- Hot flashes. Not Present- Cold Intolerance, Excessive Hunger, Hair Changes, Heat Intolerance and New Diabetes. Hematology Not Present- Easy Bruising, Excessive bleeding, Gland problems, HIV and Persistent Infections.  Vitals   Weight: 199 lb Height: 67.5in Body Surface Area: 2.03 m Body Mass Index: 30.71 kg/m  Temp.: 98.28F  Pulse: 98 (Regular)  BP: 132/82 (Sitting, Left Arm, Standard)       Physical Exam  General Mental Status-Alert. General Appearance-Consistent with stated age. Hydration-Well hydrated. Voice-Normal.  Head and Neck Head-normocephalic,  atraumatic with no lesions or palpable masses. Trachea-midline. Thyroid Gland Characteristics - normal size and consistency.  Eye Eyeball - Bilateral-Extraocular movements intact. Sclera/Conjunctiva - Bilateral-No scleral icterus.  Chest and Lung Exam Chest and lung exam reveals -quiet, even and easy respiratory effort with  no use of accessory muscles and on auscultation, normal breath sounds, no adventitious sounds and normal vocal resonance. Inspection Chest Wall - Normal. Back - normal.  Breast Note: There is no palpable mass in either breast. There is no palpable axillary, supraclavicular, or cervical lymphadenopathy.   Cardiovascular Cardiovascular examination reveals -normal heart sounds, regular rate and rhythm with no murmurs and normal pedal pulses bilaterally.  Abdomen Inspection Inspection of the abdomen reveals - No Hernias. Skin - Scar - no surgical scars. Palpation/Percussion Palpation and Percussion of the abdomen reveal - Soft, Non Tender, No Rebound tenderness, No Rigidity (guarding) and No hepatosplenomegaly. Auscultation Auscultation of the abdomen reveals - Bowel sounds normal.  Neurologic Neurologic evaluation reveals -alert and oriented x 3 with no impairment of recent or remote memory. Mental Status-Normal.  Musculoskeletal Normal Exam - Left-Upper Extremity Strength Normal and Lower Extremity Strength Normal. Normal Exam - Right-Upper Extremity Strength Normal and Lower Extremity Strength Normal.  Lymphatic Head & Neck  General Head & Neck Lymphatics: Bilateral - Description - Normal. Axillary  General Axillary Region: Bilateral - Description - Normal. Tenderness - Non Tender. Femoral & Inguinal  Generalized Femoral & Inguinal Lymphatics: Bilateral - Description - Normal. Tenderness - Non Tender.     BREAST NEOPLASM, TIS (LCIS), LEFT (D05.02) Impression: The patient has a newly diagnosed area of DCIS in the lateral and  inferior left breast. One of these areas covers about 5.5 centimeters and the other about 1-1/2 cm. I have talked to her in detail about the different options for treatment and at this point she cannot decide between breast conservation versus mastectomy. She will think about it over the weekend and let us know. We will plan to refer her to plastic surgery to talk about options for reconstruction. I discussed with her in detail the patient benefits of the surgeries as well as some of the technical aspects and she understands. Current Plans Referred to Surgery - Plastic, for evaluation and follow up (Plastic Surgery). Routine. Patient wants bilateral mastectomy with reconstruction with sentinel node mapping on the left

## 2016-09-08 NOTE — Anesthesia Procedure Notes (Addendum)
Anesthesia Regional Block:  Pectoralis block  Pre-Anesthetic Checklist: ,, timeout performed, Correct Patient, Correct Site, Correct Laterality, Correct Procedure, Correct Position, site marked, Risks and benefits discussed,  Surgical consent,  Pre-op evaluation,  At surgeon's request and post-op pain management  Laterality: Right and Left  Prep: chloraprep       Needles:   Needle Type: Echogenic Stimulator Needle     Needle Length: 9cm 9 cm Needle Gauge: 21 and 21 G  Needle insertion depth: 6 cm   Additional Needles:  Procedures: ultrasound guided (picture in chart) Pectoralis block Narrative:  Start time: 09/08/2016 8:10 AM End time: 09/08/2016 8:36 AM Injection made incrementally with aspirations every 5 mL.  Performed by: Personally  Anesthesiologist: Avary Pitsenbarger  Additional Notes: This block performed bilaterally s problems

## 2016-09-08 NOTE — Anesthesia Postprocedure Evaluation (Addendum)
Anesthesia Post Note  Patient: PERSEIS BARCLAY  Procedure(s) Performed: Procedure(s) (LRB): LEFT MASTECTOMY WITH LEFT SENTINEL LYMPH NODE BIOPSY, RIGHT PROPHYLACTIC MASTECTOMY (Bilateral) IMMEDIATE PLACEMENT OF BILATERAL TISSUE EXPANDERS AFTER MASTECTOMIES (Bilateral)  Patient location during evaluation: PACU Anesthesia Type: General and Regional Level of consciousness: sedated and awake Pain management: pain level controlled Vital Signs Assessment: post-procedure vital signs reviewed and stable Respiratory status: spontaneous breathing, nonlabored ventilation, respiratory function stable and patient connected to nasal cannula oxygen Cardiovascular status: blood pressure returned to baseline and stable Postop Assessment: no signs of nausea or vomiting Anesthetic complications: no       Last Vitals:  Vitals:   09/08/16 1330 09/08/16 1345  BP: 123/80 115/68  Pulse: (!) 104 97  Resp: 13 11  Temp:      Last Pain:  Vitals:   09/08/16 1345  TempSrc:   PainSc: 6                  Dominika Losey,JAMES TERRILL

## 2016-09-08 NOTE — Anesthesia Preprocedure Evaluation (Addendum)
Anesthesia Evaluation  Patient identified by MRN, date of birth, ID band Patient awake    History of Anesthesia Complications (+) history of anesthetic complications  Airway Mallampati: II  TM Distance: >3 FB Neck ROM: Full    Dental  (+) Teeth Intact, Dental Advisory Given   Pulmonary neg pulmonary ROS,    breath sounds clear to auscultation       Cardiovascular + Peripheral Vascular Disease   Rhythm:Regular Rate:Normal     Neuro/Psych  Headaches,    GI/Hepatic   Endo/Other  Hypothyroidism   Renal/GU      Musculoskeletal   Abdominal   Peds  Hematology   Anesthesia Other Findings   Reproductive/Obstetrics                           Anesthesia Physical Anesthesia Plan  ASA: II  Anesthesia Plan: General   Post-op Pain Management:  Regional for Post-op pain   Induction: Intravenous  Airway Management Planned: Oral ETT  Additional Equipment:   Intra-op Plan:   Post-operative Plan: Extubation in OR  Informed Consent: I have reviewed the patients History and Physical, chart, labs and discussed the procedure including the risks, benefits and alternatives for the proposed anesthesia with the patient or authorized representative who has indicated his/her understanding and acceptance.   Dental advisory given  Plan Discussed with: CRNA and Anesthesiologist  Anesthesia Plan Comments:        Anesthesia Quick Evaluation

## 2016-09-08 NOTE — Transfer of Care (Signed)
Immediate Anesthesia Transfer of Care Note  Patient: [name]  Procedure(s) Performed: Procedure(s): LEFT MASTECTOMY WITH LEFT SENTINEL LYMPH NODE BIOPSY, RIGHT PROPHYLACTIC MASTECTOMY (Bilateral) IMMEDIATE PLACEMENT OF BILATERAL TISSUE EXPANDERS AFTER MASTECTOMIES (Bilateral)  Patient Location: PACU  Anesthesia Type:GA combined with regional for post-op pain  Level of Consciousness: awake, alert , oriented and patient cooperative  Airway & Oxygen Therapy: Patient Spontanous Breathing  Post-op Assessment: Report given to RN, Post -op Vital signs reviewed and stable and Patient moving all extremities X 4  Post vital signs: Reviewed and stable  Last Vitals:  Vitals:   09/08/16 0835 09/08/16 0840  BP: 130/72   Pulse: 99   Resp: 11 10  Temp:      Last Pain:  Vitals:   09/08/16 0733  TempSrc:   PainSc: 6       Patients Stated Pain Goal: 7 ([id number])  Complications: No apparent anesthesia complications

## 2016-09-08 NOTE — Anesthesia Procedure Notes (Signed)
Procedure Name: Intubation Date/Time: 09/08/2016 9:22 AM Performed by: Julieta Bellini Pre-anesthesia Checklist: Emergency Drugs available, Patient identified, Suction available, Patient being monitored and Timeout performed Patient Re-evaluated:Patient Re-evaluated prior to inductionOxygen Delivery Method: Circle system utilized Preoxygenation: Pre-oxygenation with 100% oxygen Intubation Type: IV induction Ventilation: Mask ventilation without difficulty Laryngoscope Size: Miller and 2 Grade View: Grade III Tube type: Oral Number of attempts: 2 Airway Equipment and Method: Stylet Placement Confirmation: ETT inserted through vocal cords under direct vision,  positive ETCO2 and breath sounds checked- equal and bilateral Secured at: 22 cm Tube secured with: Tape Dental Injury: Teeth and Oropharynx as per pre-operative assessment  Difficulty Due To: Difficult Airway- due to anterior larynx Comments: First attempt at intubation with Mac 3, no view by CRNA. Second attempt with Sabra Heck 2, Grade 4 view by CRNA. MD intubated using Miller 2.

## 2016-09-09 DIAGNOSIS — D0512 Intraductal carcinoma in situ of left breast: Secondary | ICD-10-CM | POA: Diagnosis not present

## 2016-09-09 LAB — CBC
HCT: 33.2 % — ABNORMAL LOW (ref 36.0–46.0)
Hemoglobin: 10.7 g/dL — ABNORMAL LOW (ref 12.0–15.0)
MCH: 27.5 pg (ref 26.0–34.0)
MCHC: 32.2 g/dL (ref 30.0–36.0)
MCV: 85.3 fL (ref 78.0–100.0)
Platelets: 250 K/uL (ref 150–400)
RBC: 3.89 MIL/uL (ref 3.87–5.11)
RDW: 14.6 % (ref 11.5–15.5)
WBC: 12.7 K/uL — ABNORMAL HIGH (ref 4.0–10.5)

## 2016-09-09 LAB — BASIC METABOLIC PANEL WITH GFR
Anion gap: 10 (ref 5–15)
BUN: 8 mg/dL (ref 6–20)
CO2: 24 mmol/L (ref 22–32)
Calcium: 9 mg/dL (ref 8.9–10.3)
Chloride: 104 mmol/L (ref 101–111)
Creatinine, Ser: 0.62 mg/dL (ref 0.44–1.00)
GFR calc Af Amer: >60 mL/min (ref >60)
GFR calc non Af Amer: >60 mL/min (ref >60)
Glucose, Bld: 101 mg/dL — ABNORMAL HIGH (ref 65–99)
Potassium: 4 mmol/L (ref 3.5–5.1)
Sodium: 138 mmol/L (ref 135–145)

## 2016-09-09 MED ORDER — BISACODYL 10 MG RE SUPP: 10.0000 mg | Freq: Every day | RECTAL | 0 refills | Status: DC | PRN

## 2016-09-09 MED ORDER — HYDROCODONE-ACETAMINOPHEN 5-325 MG PO TABS: 1.0000 | ORAL_TABLET | ORAL | 0 refills | Status: DC | PRN

## 2016-09-09 MED ORDER — ACETAMINOPHEN 500 MG PO TABS: 1000.0000 mg | ORAL_TABLET | Freq: Four times a day (QID) | ORAL | 0 refills | Status: DC

## 2016-09-09 MED ORDER — PANTOPRAZOLE SODIUM 40 MG PO TBEC: 40.0000 mg | DELAYED_RELEASE_TABLET | Freq: Every day | ORAL | Status: DC

## 2016-09-09 MED ORDER — DIAZEPAM 2 MG PO TABS: 2.0000 mg | ORAL_TABLET | Freq: Two times a day (BID) | ORAL | 0 refills | Status: DC | PRN

## 2016-09-09 MED ORDER — POLYETHYLENE GLYCOL 3350 17 G PO PACK: 17.0000 g | PACK | Freq: Every day | ORAL | 0 refills | Status: DC | PRN

## 2016-09-09 NOTE — Discharge Summary (Signed)
**Note De-Identified Stephanie Obfuscation** Physician Discharge Summary  Patient ID: [name] MRN: [id number] DOB/AGE: June 19, 1966 51 y.o.  Admit date: 09/08/2016 Discharge date: 09/09/2016  Discharge Diagnoses Patient Active Problem List   Diagnosis Date Noted  . Breast cancer ([place]) 09/08/2016  . 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 S/P laparoscopic assisted vaginal hysterectomy ([facility]) 06/17/2016  . Chronic migraine 12/10/2015  . Chest pain 02/10/2014  . Myalgia 02/10/2014  . Hot flashes due to tamoxifen 02/10/2014  . Neoplasm of left breast, primary tumor staging category Tis 05/19/2013    Consultants NONE  Procedures POST-OPERATIVE DIAGNOSIS:  LEFT BREAST DCIS AND LCIS   LEFT MASTECTOMY WITH LEFT DEEP AXILLARY SENTINEL LYMPH NODE BIOPSY, RIGHT PROPHYLACTIC MASTECTOMY (Bilateral) IMMEDIATE PLACEMENT OF BILATERAL TISSUE EXPANDERS AFTER MASTECTOMIES (Bilateral)  IMPLANTS: Left - Mentor 350 cc with 200 cc of saline placed Right - Mentor 350 cc. With 200 cc of saline placed Acellular Dermal Matrix 4 x 16 cm two  HPI: [name] is a 51 yo female admitted for bilateral mastectomies and breast reconstruction for left cancer.    Hospital Course: The patient was admitted and underwent bilateral mastectomies for left breast DCIS and LCIS, followed by immediate bilateral breast reconstruction with tissue expanders and acellular dermal matrix (Flex HD). She did well following surgery and had no complications and is discharged home in stable condition.     Allergies as of 09/09/2016      Reactions   Penicillins Rash   Has patient had a PCN reaction causing immediate rash, facial/tongue/throat swelling, SOB or lightheadedness with hypotension: No. Rash not immediate.  Has patient had a PCN reaction causing severe rash involving mucus membranes or skin necrosis: No Has patient had a PCN reaction that required hospitalization No Has patient had a PCN reaction occurring within the last 10 years:  No If all of the above answers are "NO", then may proceed with Cephalosporin use.      Medication List    TAKE these medications   acetaminophen 500 MG tablet Commonly known as:  TYLENOL Take 2 tablets (1,000 mg total) by mouth every 6 (six) hours.   aspirin-acetaminophen-caffeine 250-250-65 MG tablet Commonly known as:  EXCEDRIN MIGRAINE Take 2 tablets by mouth every 6 (six) hours as needed for headache.   bisacodyl 10 MG suppository Commonly known as:  DULCOLAX Place 1 suppository (10 mg total) rectally daily as needed for moderate constipation.   CALCIUM + D PO Take 2 tablets by mouth daily.   cholecalciferol 1000 units tablet Commonly known as:  VITAMIN D Take 1,000 Units by mouth daily.   cyanocobalamin 500 MCG tablet Take 500 mcg by mouth daily.   diazepam 2 MG tablet Commonly known as:  VALIUM Take 1 tablet (2 mg total) by mouth every 12 (twelve) hours as needed for muscle spasms.   HYDROcodone-acetaminophen 5-325 MG tablet Commonly known as:  NORCO/VICODIN Take 1-2 tablets by mouth every 4 (four) hours as needed for moderate pain.   KRILL OIL OMEGA-3 PO Take 1 tablet by mouth daily.   magnesium oxide 400 MG tablet Commonly known as:  MAG-OX Take 400 mg by mouth daily.   multivitamin with minerals Tabs tablet Take 1 tablet by mouth daily.   ondansetron 4 MG disintegrating tablet Commonly known as:  ZOFRAN ODT Take 1 tablet (4 mg total) by mouth every 8 (eight) hours as needed for nausea or vomiting.   polyethylene glycol packet Commonly known as:  MIRALAX / GLYCOLAX Take 17 g  by mouth daily as needed for mild constipation.   propranolol 20 MG tablet Commonly known as:  INDERAL Take 1 tablet (20 mg total) by mouth 2 (two) times daily.   Riboflavin 100 MG Caps Take 100 mg by mouth daily.   rizatriptan 10 MG disintegrating tablet Commonly known as:  MAXALT-MLT Take 1 tablet (10 mg total) by mouth as needed. May repeat in 2 hours if needed What  changed:  reasons to take this  additional instructions   sennosides-docusate sodium 8.6-50 MG tablet Commonly known as:  SENOKOT-S Take 2 tablets by mouth daily.   SUMAtriptan 6 MG/0.5ML Soaj Commonly known as:  IMITREX STATDOSE SYSTEM Inject 6 mg into the skin as needed. What changed:  reasons to take this   SYNTHROID 112 MCG tablet Generic drug:  levothyroxine Take 112 mcg by mouth daily. BRAND ONLY SYNTHROID   vitamin C 500 MG tablet Commonly known as:  ASCORBIC ACID Take 500 mg by mouth 2 (two) times daily.   ZZZQUIL 50 MG/30ML Liqd Generic drug:  DiphenhydrAMINE HCl Take 30 mLs by mouth at bedtime as needed (sleep).        Follow-up Information    CLAIRE S DILLINGHAM, DO In 1 week.   Specialty:  Plastic Surgery Contact information: [place] [address] [phone]        Merrie Roof, MD In 2 weeks.   Specialty:  General Surgery Contact information: [place] [address] [phone]           Signed: [facility] [phone]  09/09/2016, 2:31 PM

## 2016-09-09 NOTE — Progress Notes (Signed)
1 Day Post-Op  Subjective: Nausea and vomiting last pm but better today and has eaten some. Up mobilizing in hallway and feels ready to go home.  Bilateral breast flaps are viable and without signs of hematoma . JP drainage is moderate   Objective: Vital signs in last 24 hours: Temp:  [97.7 F (36.5 C)-98.6 F (37 C)] 98.6 F (37 C) (02/13 0958) Pulse Rate:  [75-116] 89 (02/13 0958) Resp:  [6-19] 18 (02/13 0958) BP: (100-129)/(55-80) 102/55 (02/13 0958) SpO2:  [95 %-100 %] 100 % (02/13 0958) Last BM Date: 09/07/16  Intake/Output from previous day: 02/12 0701 - 02/13 0700 In: 2732.5 [P.O.:60; I.V.:2672.5] Out: 500 [Urine:150; Drains:290; Blood:60] Intake/Output this shift: Total I/O In: 600 [P.O.:600] Out: -   General appearance: alert, cooperative, appears stated age and mild distress Bilateral breast flaps are viable and without signs of hematom, seroma or infection.   Lab Results:   Recent Labs  09/09/16 0558  WBC 12.7*  HGB 10.7*  HCT 33.2*  PLT 250   BMET  Recent Labs  09/09/16 0558  NA 138  K 4.0  CL 104  CO2 24  GLUCOSE 101*  BUN 8  CREATININE 0.62  CALCIUM 9.0   PT/INR No results for input(s): LABPROT, INR in the last 72 hours. ABG No results for input(s): PHART, HCO3 in the last 72 hours.  Invalid input(s): PCO2, PO2  Studies/Results: No results found.  Anti-infectives: Anti-infectives    Start     Dose/Rate Route Frequency Ordered Stop   09/08/16 1515  ciprofloxacin (CIPRO) IVPB 400 mg     400 mg 200 mL/hr over 60 Minutes Intravenous Every 12 hours 09/08/16 1500     09/08/16 1148  polymyxin B 500,000 Units, bacitracin 50,000 Units in sodium chloride irrigation 0.9 % 500 mL irrigation  Status:  Discontinued       As needed 09/08/16 1148 09/08/16 1254   09/08/16 0845  vancomycin (VANCOCIN) 1,500 mg in sodium chloride 0.9 % 500 mL IVPB     1,500 mg 250 mL/hr over 120 Minutes Intravenous On call to O.R. 09/07/16 [id number] 09/08/16 1107   09/08/16 0709  ciprofloxacin (CIPRO) IVPB 400 mg     400 mg 200 mL/hr over 60 Minutes Intravenous On call to O.R. 09/08/16 0709 09/08/16 1110      Assessment/Plan: s/p Procedure(s): LEFT MASTECTOMY WITH LEFT SENTINEL LYMPH NODE BIOPSY, RIGHT PROPHYLACTIC MASTECTOMY (Bilateral) IMMEDIATE PLACEMENT OF BILATERAL TISSUE EXPANDERS AFTER MASTECTOMIES (Bilateral) Discharge home with husband today.  follow up in office next week  LOS: 1 day    [facility] [phone]

## 2016-09-09 NOTE — Progress Notes (Signed)
1 Day Post-Op  Subjective: Had some nausea overnight but seems to be better today. No significant pain  Objective: Vital signs in last 24 hours: Temp:  [97.7 F (36.5 C)-98.6 F (37 C)] 98.6 F (37 C) (02/13 0958) Pulse Rate:  [75-116] 89 (02/13 0958) Resp:  [6-19] 18 (02/13 0958) BP: (100-129)/(55-80) 102/55 (02/13 0958) SpO2:  [95 %-100 %] 100 % (02/13 0958) Last BM Date: 09/07/16  Intake/Output from previous day: 02/12 0701 - 02/13 0700 In: 2732.5 [P.O.:60; I.V.:2672.5] Out: 500 [Urine:150; Drains:290; Blood:60] Intake/Output this shift: Total I/O In: 480 [P.O.:480] Out: -   Resp: clear to auscultation bilaterally Chest wall: skin flaps look good Cardio: regular rate and rhythm GI: soft, non-tender; bowel sounds normal; no masses,  no organomegaly  Lab Results:   Recent Labs  09/09/16 0558  WBC 12.7*  HGB 10.7*  HCT 33.2*  PLT 250   BMET  Recent Labs  09/09/16 0558  NA 138  K 4.0  CL 104  CO2 24  GLUCOSE 101*  BUN 8  CREATININE 0.62  CALCIUM 9.0   PT/INR No results for input(Wilkins): LABPROT, INR in the last 72 hours. ABG No results for input(Wilkins): PHART, HCO3 in the last 72 hours.  Invalid input(Wilkins): PCO2, PO2  Studies/Results: No results found.  Anti-infectives: Anti-infectives    Start     Dose/Rate Route Frequency Ordered Stop   09/08/16 1515  ciprofloxacin (CIPRO) IVPB 400 mg     400 mg 200 mL/hr over 60 Minutes Intravenous Every 12 hours 09/08/16 1500     09/08/16 1148  polymyxin B 500,000 Units, bacitracin 50,000 Units in sodium chloride irrigation 0.9 % 500 mL irrigation  Status:  Discontinued       As needed 09/08/16 1148 09/08/16 1254   09/08/16 0845  vancomycin (VANCOCIN) 1,500 mg in sodium chloride 0.9 % 500 mL IVPB     1,500 mg 250 mL/hr over 120 Minutes Intravenous On call to O.R. 09/07/16 [id number] 09/08/16 1107   09/08/16 0709  ciprofloxacin (CIPRO) IVPB 400 mg     400 mg 200 mL/hr over 60 Minutes Intravenous On call to O.R. 09/08/16  0709 09/08/16 1110      Assessment/Plan: Wilkins/p Procedure(Wilkins): LEFT MASTECTOMY WITH LEFT SENTINEL LYMPH NODE BIOPSY, RIGHT PROPHYLACTIC MASTECTOMY (Bilateral) IMMEDIATE PLACEMENT OF BILATERAL TISSUE EXPANDERS AFTER MASTECTOMIES (Bilateral) Advance diet Discharge ok from general surgical standpoint  LOS: 1 day    Stephanie Stephanie,Stephanie Stephanie 09/09/2016

## 2016-09-09 NOTE — Progress Notes (Signed)
IV removed. Discharge paperwork reviewed with patient and patient's husband. Patient's husband demonstrated emptying and milking a JP drain. RN provided patient  with JP drain sheet and instructed patient and husband how to document . Patient is ready for discharge.

## 2016-09-10 ENCOUNTER — Encounter (HOSPITAL_COMMUNITY): Payer: Self-pay | Admitting: General Surgery

## 2016-09-23 DIAGNOSIS — Z9013 Acquired absence of bilateral breasts and nipples: Secondary | ICD-10-CM | POA: Insufficient documentation

## 2016-09-25 ENCOUNTER — Ambulatory Visit (HOSPITAL_BASED_OUTPATIENT_CLINIC_OR_DEPARTMENT_OTHER): Payer: BC Managed Care – PPO | Admitting: Hematology & Oncology

## 2016-09-25 ENCOUNTER — Other Ambulatory Visit (HOSPITAL_BASED_OUTPATIENT_CLINIC_OR_DEPARTMENT_OTHER): Payer: BC Managed Care – PPO

## 2016-09-25 VITALS — BP 108/68 | HR 89 | Temp 98.6°F | Resp 18 | Wt 196.0 lb

## 2016-09-25 DIAGNOSIS — M818 Other osteoporosis without current pathological fracture: Secondary | ICD-10-CM

## 2016-09-25 DIAGNOSIS — D0512 Intraductal carcinoma in situ of left breast: Secondary | ICD-10-CM | POA: Diagnosis not present

## 2016-09-25 DIAGNOSIS — T386X5A Adverse effect of antigonadotrophins, antiestrogens, antiandrogens, not elsewhere classified, initial encounter: Secondary | ICD-10-CM

## 2016-09-25 LAB — COMPREHENSIVE METABOLIC PANEL (CC13)
A/G RATIO: 1.5 (ref 1.2–2.2)
ALBUMIN: 4.1 g/dL (ref 3.5–5.5)
ALT: 16 IU/L (ref 0–32)
AST (SGOT): 20 IU/L (ref 0–40)
Alkaline Phosphatase, S: 88 IU/L (ref 39–117)
BUN / CREAT RATIO: 21 (ref 9–23)
BUN: 13 mg/dL (ref 6–24)
CHLORIDE: 100 mmol/L (ref 96–106)
Calcium, Ser: 9.6 mg/dL (ref 8.7–10.2)
Carbon Dioxide, Total: 27 mmol/L (ref 18–29)
Creatinine, Ser: 0.63 mg/dL (ref 0.57–1.00)
GFR calc non Af Amer: 106 mL/min/{1.73_m2} (ref >59)
GFR, EST AFRICAN AMERICAN: 122 mL/min/{1.73_m2} (ref >59)
GLUCOSE: 102 mg/dL — AB (ref 65–99)
Globulin, Total: 2.7 g/dL (ref 1.5–4.5)
Potassium, Ser: 4.1 mmol/L (ref 3.5–5.2)
Sodium: 134 mmol/L (ref 134–144)
TOTAL PROTEIN: 6.8 g/dL (ref 6.0–8.5)

## 2016-09-25 LAB — CBC WITH DIFFERENTIAL (CANCER CENTER ONLY)
BASO#: 0.1 10*3/uL (ref 0.0–0.2)
BASO%: 1 % (ref 0.0–2.0)
EOS ABS: 0.4 10*3/uL (ref 0.0–0.5)
EOS%: 5.8 % (ref 0.0–7.0)
HCT: 37.6 % (ref 34.8–46.6)
HEMOGLOBIN: 12.6 g/dL (ref 11.6–15.9)
LYMPH#: 2.5 10*3/uL (ref 0.9–3.3)
LYMPH%: 36.3 % (ref 14.0–48.0)
MCH: 28.6 pg (ref 26.0–34.0)
MCHC: 33.5 g/dL (ref 32.0–36.0)
MCV: 86 fL (ref 81–101)
MONO#: 0.6 10*3/uL (ref 0.1–0.9)
MONO%: 8.7 % (ref 0.0–13.0)
NEUT%: 48.2 % (ref 39.6–80.0)
NEUTROS ABS: 3.3 10*3/uL (ref 1.5–6.5)
Platelets: 442 10*3/uL — ABNORMAL HIGH (ref 145–400)
RBC: 4.4 10*6/uL (ref 3.70–5.32)
RDW: 14.2 % (ref 11.1–15.7)
WBC: 6.8 10*3/uL (ref 3.9–10.0)

## 2016-09-25 NOTE — Progress Notes (Signed)
Hematology and Oncology Follow Up Visit  [name] [id number] Aug 14, 1965 51 y.o. 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 She did have her bilateral mastectomy. This is done on February 12. The pathology report [id number]) shows high-grade ductal carcinoma in situ. She had 2 foci measuring 1.1 and 0.7 cm. All margins were negative. An anterior margin was focally less than 0.1 cm. Surprisingly, the right breast showed focal ductal hyperplasia. A sentinel lymph node was taken in the left ankle which was negative.  She is having reconstruction. She had expanders placed. She is getting these filled.  She still has a drainage tube under the left mastectomy site. The right breast drainage tube was taken out.  She has had no problems with fever. She's had no problems with cough. There's been no arm swelling. Her shoulders are a stiff.  She does not have any rashes. Her appetite has been pretty good. There is no issues with bowels or bladder.  Overall, her performance status is ECOG 1.  Medications:  Current Outpatient Prescriptions:  .  acetaminophen (TYLENOL) 500 MG tablet, Take 2 tablets (1,000 mg total) by mouth every 6 (six) hours., Disp: 30 tablet, Rfl: 0 .  aspirin-acetaminophen-caffeine (EXCEDRIN MIGRAINE) [id number] MG per tablet, Take 2 tablets by mouth every 6 (six) hours as needed for headache. , Disp: , Rfl:  .  Calcium Carbonate-Vitamin D (CALCIUM + D PO), Take 2 tablets by mouth daily. , Disp: , Rfl:  .  cholecalciferol (VITAMIN D) 1000 units tablet, Take 1,000 Units by mouth daily. , Disp: , Rfl:  .  cyanocobalamin 500 MCG tablet, Take 500 mcg by mouth daily., Disp: , Rfl:  .  diazepam (VALIUM) 2 MG tablet, Take 1 tablet (2 mg total) by mouth every 12 (twelve) hours as needed for muscle spasms., Disp: 30 tablet, Rfl: 0 .   DiphenhydrAMINE HCl (ZZZQUIL) 50 MG/30ML LIQD, Take 30 mLs by mouth at bedtime as needed (sleep)., Disp: , Rfl:  .  HYDROcodone-acetaminophen (NORCO/VICODIN) 5-325 MG tablet, Take 1-2 tablets by mouth every 4 (four) hours as needed for moderate pain., Disp: 30 tablet, Rfl: 0 .  KRILL OIL OMEGA-3 PO, Take 1 tablet by mouth daily. , Disp: , Rfl:  .  magnesium oxide (MAG-OX) 400 MG tablet, Take 400 mg by mouth daily., Disp: , Rfl:  .  Multiple Vitamin (MULTIVITAMIN WITH MINERALS) TABS tablet, Take 1 tablet by mouth daily., Disp: , Rfl:  .  ondansetron (ZOFRAN ODT) 4 MG disintegrating tablet, Take 1 tablet (4 mg total) by mouth every 8 (eight) hours as needed for nausea or vomiting., Disp: 20 tablet, Rfl: 3 .  Riboflavin 100 MG CAPS, Take 100 mg by mouth daily., Disp: , Rfl:  .  rizatriptan (MAXALT-MLT) 10 MG disintegrating tablet, Take 1 tablet (10 mg total) by mouth as needed. May repeat in 2 hours if needed (Patient taking differently: Take 10 mg by mouth as needed for migraine. May repeat in 2 hours if needed), Disp: 36 tablet, Rfl: 4 .  SUMAtriptan (IMITREX STATDOSE SYSTEM) 6 MG/0.5ML SOAJ, Inject 6 mg into the skin as needed. (Patient taking differently: Inject 6 mg into the skin as needed (migraines). ), Disp: 10 cartridge, Rfl: 11 .  SYNTHROID 112 MCG tablet, Take 125 mcg by mouth daily. BRAND ONLY SYNTHROID , Disp: , Rfl: 1 .  vitamin C (ASCORBIC ACID) 500 MG tablet, Take 500 mg by mouth 2 (two) times daily., Disp: , Rfl:   Allergies:  Allergies  Allergen Reactions  . Penicillins Rash    Has patient had a PCN reaction causing immediate rash, facial/tongue/throat swelling, SOB or lightheadedness with hypotension: No. Rash not immediate.  Has patient had a PCN reaction causing severe rash involving mucus membranes or skin necrosis: No Has patient had a PCN reaction that required hospitalization No Has patient had a PCN reaction occurring within the last 10 years: No If all of the above answers  are "NO", then may proceed with Cephalosporin use.     Past Medical History, Surgical history, Social history, and Family History were reviewed and updated.  Review of Systems: As above  Physical Exam:  weight is 196 lb (88.9 kg). Her oral temperature is 98.6 F (37 C). Her blood pressure is 108/68 and her pulse is 89. Her respiration is 18 and oxygen saturation is 99%.   Wt Readings from Last 3 Encounters:  09/25/16 196 lb (88.9 kg)  09/02/16 192 lb 11.2 oz (87.4 kg)  08/13/16 199 lb (90.3 kg)     Well-developed and well-nourished white female. Head and neck exam shows no ocular or oral lesions. She has no palpable cervical or supraclavicular lymph nodes. Lungs are clear bilaterally. Cardiac exam regular rate and rhythm with no murmurs, rubs or bruits. Chest wall exam shows bilateral mastectomies. There are healing nicely. There is no erythema. She has no nodularity. She does have the drainage tube under the left chest wall. Abdomen is soft. She has good bowel sounds. There is no fluid wave. There is no palpable liver or spleen tip. Extremities shows no clubbing, cyanosis or edema. She has no lymphedema in her arms. Skin exam shows no rashes, ecchymoses or petechia.  Lab Results  Component Value Date   WBC 6.8 09/25/2016   HGB 12.6 09/25/2016   HCT 37.6 09/25/2016   MCV 86 09/25/2016   PLT 442 (H) 09/25/2016     Chemistry      Component Value Date/Time   NA 138 09/09/2016 0558   NA 136 08/13/2016 1149   NA 138 05/30/2016 0746   K 4.0 09/09/2016 0558   K 3.5 08/13/2016 1149   K 3.8 05/30/2016 0746   CL 104 09/09/2016 0558   CL 102 08/13/2016 1149   CO2 24 09/09/2016 0558   CO2 29 08/13/2016 1149   CO2 25 05/30/2016 0746   BUN 8 09/09/2016 0558   BUN 10 08/13/2016 1149   BUN 10.0 05/30/2016 0746   CREATININE 0.62 09/09/2016 0558   CREATININE 0.7 08/13/2016 1149   CREATININE 0.7 05/30/2016 0746      Component Value Date/Time   CALCIUM 9.0 09/09/2016 0558   CALCIUM  9.4 08/13/2016 1149   CALCIUM 9.2 05/30/2016 0746   ALKPHOS 65 08/13/2016 1149   ALKPHOS 71 05/30/2016 0746   AST 22 08/13/2016 1149   AST 16 05/30/2016 0746   ALT 21 08/13/2016 1149   ALT 11 05/30/2016 0746   BILITOT 0.80 08/13/2016 1149   BILITOT 0.83 05/30/2016 0746         Impression and Plan: Ms. Strid is a 51 year old white female. She had ductal carcinoma in situ of the left breast. This was multifocal. She underwent bilateral mastectomies.  I'm still surprised that the MRI that she had pre-surgery showed such large tumors and yet the actual tumors were quite small. I'm sure that there is  a reason for this.  I don't see that there is any indication for any type of adjuvant therapy. She's had bilateral mastectomies. Her margins are negative. I do not see that we have to put her through radiation.  I will plan to get her back in 4 months. I think this is reasonable.  I'm just glad that she got through surgery so well. She has a strong faith. She will do well.   [name], MD 3/1/20182:24 PM

## 2016-09-26 ENCOUNTER — Telehealth: Payer: Self-pay | Admitting: *Deleted

## 2016-09-26 LAB — VITAMIN D 25 HYDROXY (VIT D DEFICIENCY, FRACTURES): Vitamin D, 25-Hydroxy: 34.3 ng/mL (ref 30.0–100.0)

## 2016-09-26 NOTE — Telephone Encounter (Addendum)
Patient aware of results  ----- Message from [name], MD sent at 09/26/2016  6:50 AM EST ----- Call - Vit D and labs are great!!  [name]

## 2016-10-14 ENCOUNTER — Ambulatory Visit: Payer: BC Managed Care – PPO | Admitting: Neurology

## 2016-12-25 ENCOUNTER — Encounter (HOSPITAL_BASED_OUTPATIENT_CLINIC_OR_DEPARTMENT_OTHER): Payer: Self-pay | Admitting: *Deleted

## 2016-12-25 DIAGNOSIS — S40811A Abrasion of right upper arm, initial encounter: Secondary | ICD-10-CM

## 2016-12-25 HISTORY — DX: Abrasion of right upper arm, initial encounter: S40.811A

## 2016-12-26 NOTE — Addendum Note (Signed)
Addendum  created 12/26/16 1117 by Rica Koyanagi, MD   Sign clinical note

## 2017-01-01 ENCOUNTER — Ambulatory Visit: Payer: Self-pay | Admitting: Plastic Surgery

## 2017-01-01 ENCOUNTER — Ambulatory Visit (HOSPITAL_BASED_OUTPATIENT_CLINIC_OR_DEPARTMENT_OTHER)
Admission: RE | Admit: 2017-01-01 | Discharge: 2017-01-01 | Disposition: A | Discharge status: Home / Self Care | Payer: BC Managed Care – PPO | Source: Ambulatory Visit | Attending: Plastic Surgery | Admitting: Plastic Surgery

## 2017-01-01 ENCOUNTER — Encounter (HOSPITAL_BASED_OUTPATIENT_CLINIC_OR_DEPARTMENT_OTHER)
Admission: RE | Disposition: A | Discharge status: Home / Self Care | Payer: Self-pay | Source: Ambulatory Visit | Attending: Plastic Surgery

## 2017-01-01 ENCOUNTER — Encounter (HOSPITAL_BASED_OUTPATIENT_CLINIC_OR_DEPARTMENT_OTHER): Payer: Self-pay

## 2017-01-01 ENCOUNTER — Ambulatory Visit (HOSPITAL_BASED_OUTPATIENT_CLINIC_OR_DEPARTMENT_OTHER): Payer: BC Managed Care – PPO | Admitting: Anesthesiology

## 2017-01-01 DIAGNOSIS — Z9013 Acquired absence of bilateral breasts and nipples: Secondary | ICD-10-CM | POA: Diagnosis not present

## 2017-01-01 DIAGNOSIS — Z79899 Other long term (current) drug therapy: Secondary | ICD-10-CM | POA: Insufficient documentation

## 2017-01-01 DIAGNOSIS — E039 Hypothyroidism, unspecified: Secondary | ICD-10-CM | POA: Diagnosis not present

## 2017-01-01 DIAGNOSIS — Z171 Estrogen receptor negative status [ER-]: Secondary | ICD-10-CM | POA: Insufficient documentation

## 2017-01-01 DIAGNOSIS — Z853 Personal history of malignant neoplasm of breast: Secondary | ICD-10-CM | POA: Insufficient documentation

## 2017-01-01 DIAGNOSIS — Z421 Encounter for breast reconstruction following mastectomy: Secondary | ICD-10-CM | POA: Diagnosis not present

## 2017-01-01 DIAGNOSIS — Z880 Allergy status to penicillin: Secondary | ICD-10-CM | POA: Diagnosis not present

## 2017-01-01 HISTORY — DX: Abrasion of right upper arm, initial encounter: S40.811A

## 2017-01-01 HISTORY — DX: Personal history of malignant neoplasm of breast: Z85.3

## 2017-01-01 HISTORY — PX: REMOVAL OF BILATERAL TISSUE EXPANDERS WITH PLACEMENT OF BILATERAL BREAST IMPLANTS: SHX6431

## 2017-01-01 HISTORY — DX: Nausea with vomiting, unspecified: R11.2

## 2017-01-01 HISTORY — DX: Migraine, unspecified, not intractable, without status migrainosus: G43.909

## 2017-01-01 HISTORY — DX: Other specified postprocedural states: Z98.890

## 2017-01-01 SURGERY — REMOVAL, TISSUE EXPANDER, BREAST, BILATERAL, WITH BILATERAL IMPLANT IMPLANT INSERTION
Anesthesia: General | Site: Breast | Laterality: Bilateral

## 2017-01-01 MED ORDER — ROCURONIUM BROMIDE 100 MG/10ML IV SOLN
INTRAVENOUS | Status: DC | PRN
  Administered 2017-01-01: 50 mg via INTRAVENOUS

## 2017-01-01 MED ORDER — SODIUM CHLORIDE 0.9 % IV SOLN: 250.0000 mL | INTRAVENOUS | Status: DC | PRN

## 2017-01-01 MED ORDER — MIDAZOLAM HCL 2 MG/2ML IJ SOLN
INTRAMUSCULAR | Status: AC
  Filled 2017-01-01: qty 2

## 2017-01-01 MED ORDER — FENTANYL CITRATE (PF) 100 MCG/2ML IJ SOLN: 50.0000 ug | INTRAMUSCULAR | Status: DC | PRN

## 2017-01-01 MED ORDER — KETOROLAC TROMETHAMINE 30 MG/ML IJ SOLN: INTRAMUSCULAR | Status: DC | PRN

## 2017-01-01 MED ORDER — SODIUM CHLORIDE 0.9% FLUSH: 3.0000 mL | Freq: Two times a day (BID) | INTRAVENOUS | Status: DC

## 2017-01-01 MED ORDER — SUGAMMADEX SODIUM 200 MG/2ML IV SOLN
INTRAVENOUS | Status: DC | PRN
  Administered 2017-01-01: 175 mg via INTRAVENOUS

## 2017-01-01 MED ORDER — CIPROFLOXACIN IN D5W 400 MG/200ML IV SOLN
INTRAVENOUS | Status: AC
  Filled 2017-01-01: qty 200

## 2017-01-01 MED ORDER — DEXAMETHASONE SODIUM PHOSPHATE 4 MG/ML IJ SOLN
INTRAMUSCULAR | Status: DC | PRN
  Administered 2017-01-01: 10 mg via INTRAVENOUS

## 2017-01-01 MED ORDER — LIDOCAINE HCL 1 % IJ SOLN
INTRAMUSCULAR | Status: AC
  Filled 2017-01-01: qty 60

## 2017-01-01 MED ORDER — LIDOCAINE-EPINEPHRINE 1 %-1:100000 IJ SOLN
INTRAMUSCULAR | Status: AC
  Filled 2017-01-01: qty 1

## 2017-01-01 MED ORDER — HYDROMORPHONE HCL 1 MG/ML IJ SOLN
INTRAMUSCULAR | Status: AC
  Filled 2017-01-01: qty 0.5

## 2017-01-01 MED ORDER — PROPOFOL 10 MG/ML IV BOLUS
INTRAVENOUS | Status: DC | PRN
  Administered 2017-01-01: 150 mg via INTRAVENOUS

## 2017-01-01 MED ORDER — BUPIVACAINE-EPINEPHRINE (PF) 0.25% -1:200000 IJ SOLN
INTRAMUSCULAR | Status: AC
Start: 2017-01-01 — End: 2017-01-01
  Filled 2017-01-01: qty 30

## 2017-01-01 MED ORDER — MEPERIDINE HCL 25 MG/ML IJ SOLN: 6.2500 mg | INTRAMUSCULAR | Status: DC | PRN

## 2017-01-01 MED ORDER — SCOPOLAMINE 1 MG/3DAYS TD PT72
1.0000 | MEDICATED_PATCH | Freq: Once | TRANSDERMAL | Status: DC | PRN
Start: 2017-01-01 — End: 2017-01-01
  Administered 2017-01-01: 1.5 mg via TRANSDERMAL

## 2017-01-01 MED ORDER — PROMETHAZINE HCL 25 MG/ML IJ SOLN
6.2500 mg | INTRAMUSCULAR | Status: AC | PRN
  Administered 2017-01-01 (×2): 6.25 mg via INTRAVENOUS

## 2017-01-01 MED ORDER — SODIUM CHLORIDE 0.9% FLUSH: 3.0000 mL | INTRAVENOUS | Status: DC | PRN

## 2017-01-01 MED ORDER — LIDOCAINE HCL (CARDIAC) 20 MG/ML IV SOLN
INTRAVENOUS | Status: DC | PRN
  Administered 2017-01-01: 75 mg via INTRAVENOUS

## 2017-01-01 MED ORDER — SUFENTANIL CITRATE 50 MCG/ML IV SOLN
INTRAVENOUS | Status: DC | PRN
  Administered 2017-01-01 (×2): 10 ug via INTRAVENOUS
  Administered 2017-01-01: 5 ug via INTRAVENOUS

## 2017-01-01 MED ORDER — OXYCODONE HCL 5 MG PO TABS
ORAL_TABLET | ORAL | Status: AC
  Filled 2017-01-01: qty 1

## 2017-01-01 MED ORDER — OXYCODONE HCL 5 MG/5ML PO SOLN: 5.0000 mg | Freq: Once | ORAL | Status: AC | PRN

## 2017-01-01 MED ORDER — PHENYLEPHRINE HCL 10 MG/ML IJ SOLN
INTRAMUSCULAR | Status: DC | PRN
  Administered 2017-01-01 (×2): 40 ug via INTRAVENOUS

## 2017-01-01 MED ORDER — PROPOFOL 10 MG/ML IV BOLUS
INTRAVENOUS | Status: AC
  Filled 2017-01-01: qty 20

## 2017-01-01 MED ORDER — LACTATED RINGERS IV SOLN
INTRAVENOUS | Status: DC
  Administered 2017-01-01 (×3): via INTRAVENOUS

## 2017-01-01 MED ORDER — MIDAZOLAM HCL 2 MG/2ML IJ SOLN
1.0000 mg | INTRAMUSCULAR | Status: DC | PRN
  Administered 2017-01-01: 2 mg via INTRAVENOUS

## 2017-01-01 MED ORDER — OXYCODONE HCL 5 MG PO TABS
5.0000 mg | ORAL_TABLET | Freq: Once | ORAL | Status: AC | PRN
  Administered 2017-01-01: 5 mg via ORAL

## 2017-01-01 MED ORDER — HYDROMORPHONE HCL 1 MG/ML IJ SOLN
0.2500 mg | INTRAMUSCULAR | Status: DC | PRN
Start: 2017-01-01 — End: 2017-01-01
  Administered 2017-01-01 (×3): 0.5 mg via INTRAVENOUS

## 2017-01-01 MED ORDER — EPINEPHRINE 30 MG/30ML IJ SOLN
INTRAMUSCULAR | Status: AC
  Filled 2017-01-01: qty 1

## 2017-01-01 MED ORDER — LIDOCAINE-EPINEPHRINE 1 %-1:100000 IJ SOLN
INTRAMUSCULAR | Status: DC | PRN
  Administered 2017-01-01: 5 mL

## 2017-01-01 MED ORDER — PROMETHAZINE HCL 25 MG/ML IJ SOLN
INTRAMUSCULAR | Status: AC
Start: 2017-01-01 — End: 2017-01-01
  Filled 2017-01-01: qty 1

## 2017-01-01 MED ORDER — ONDANSETRON HCL 4 MG/2ML IJ SOLN
INTRAMUSCULAR | Status: DC | PRN
  Administered 2017-01-01: 4 mg via INTRAVENOUS

## 2017-01-01 MED ORDER — EPHEDRINE SULFATE 50 MG/ML IJ SOLN
INTRAMUSCULAR | Status: DC | PRN
  Administered 2017-01-01: 15 mg via INTRAVENOUS
  Administered 2017-01-01: 10 mg via INTRAVENOUS

## 2017-01-01 MED ORDER — CIPROFLOXACIN IN D5W 400 MG/200ML IV SOLN
400.0000 mg | INTRAVENOUS | Status: AC
  Administered 2017-01-01: 400 mg via INTRAVENOUS

## 2017-01-01 MED ORDER — SUFENTANIL CITRATE 50 MCG/ML IV SOLN
INTRAVENOUS | Status: AC
  Filled 2017-01-01: qty 1

## 2017-01-01 MED ORDER — LIDOCAINE HCL 1 % IJ SOLN
INTRAVENOUS | Status: DC | PRN
  Administered 2017-01-01: 300 mL

## 2017-01-01 MED ORDER — SODIUM CHLORIDE 0.9 % IJ SOLN
INTRAMUSCULAR | Status: AC
  Filled 2017-01-01: qty 10

## 2017-01-01 SURGICAL SUPPLY — 75 items
ADH SKN CLS APL DERMABOND .7 (GAUZE/BANDAGES/DRESSINGS) ×2
BAG DECANTER FOR FLEXI CONT (MISCELLANEOUS) ×1 IMPLANT
BINDER BREAST LRG (GAUZE/BANDAGES/DRESSINGS) IMPLANT
BINDER BREAST MEDIUM (GAUZE/BANDAGES/DRESSINGS) IMPLANT
BINDER BREAST XLRG (GAUZE/BANDAGES/DRESSINGS) IMPLANT
BINDER BREAST XXLRG (GAUZE/BANDAGES/DRESSINGS) ×1 IMPLANT
BIOPATCH RED 1 DISK 7.0 (GAUZE/BANDAGES/DRESSINGS) IMPLANT
BLADE HEX COATED 2.75 (ELECTRODE) ×2 IMPLANT
BLADE SURG 15 STRL LF DISP TIS (BLADE) ×2 IMPLANT
BLADE SURG 15 STRL SS (BLADE) ×4
BNDG GAUZE ELAST 4 BULKY (GAUZE/BANDAGES/DRESSINGS) ×2 IMPLANT
CANISTER SUCT 1200ML W/VALVE (MISCELLANEOUS) ×3 IMPLANT
CHLORAPREP W/TINT 26ML (MISCELLANEOUS) ×2 IMPLANT
CORDS BIPOLAR (ELECTRODE) IMPLANT
COVER BACK TABLE 60X90IN (DRAPES) ×2 IMPLANT
COVER MAYO STAND STRL (DRAPES) ×2 IMPLANT
DECANTER SPIKE VIAL GLASS SM (MISCELLANEOUS) IMPLANT
DERMABOND ADVANCED (GAUZE/BANDAGES/DRESSINGS) ×2
DERMABOND ADVANCED .7 DNX12 (GAUZE/BANDAGES/DRESSINGS) IMPLANT
DRAIN CHANNEL 19F RND (DRAIN) IMPLANT
DRAPE LAPAROSCOPIC ABDOMINAL (DRAPES) ×2 IMPLANT
DRSG PAD ABDOMINAL 8X10 ST (GAUZE/BANDAGES/DRESSINGS) ×4 IMPLANT
ELECT BLADE 4.0 EZ CLEAN MEGAD (MISCELLANEOUS) ×2
ELECT REM PT RETURN 9FT ADLT (ELECTROSURGICAL) ×2
ELECTRODE BLDE 4.0 EZ CLN MEGD (MISCELLANEOUS) ×1 IMPLANT
ELECTRODE REM PT RTRN 9FT ADLT (ELECTROSURGICAL) ×1 IMPLANT
EVACUATOR SILICONE 100CC (DRAIN) IMPLANT
GAUZE SPONGE 4X4 12PLY STRL LF (GAUZE/BANDAGES/DRESSINGS) IMPLANT
GLOVE BIO SURGEON STRL SZ 6.5 (GLOVE) ×6 IMPLANT
GLOVE BIO SURGEON STRL SZ7 (GLOVE) ×2 IMPLANT
GLOVE SURG SS PI 7.0 STRL IVOR (GLOVE) ×1 IMPLANT
GOWN STRL REUS W/ TWL LRG LVL3 (GOWN DISPOSABLE) ×2 IMPLANT
GOWN STRL REUS W/TWL LRG LVL3 (GOWN DISPOSABLE) ×10
IMPL BREAST P6.5XULT HI 650 (Breast) IMPLANT
IMPL BREAST SILICONE 700CC (Breast) IMPLANT
IMPL BRST P6.5XULT HI 650CC (Breast) ×1 IMPLANT
IMPLANT BREAST GEL 650CC (Breast) ×2 IMPLANT
IMPLANT BREAST SILICONE 700CC (Breast) ×2 IMPLANT
IV NS 1000ML (IV SOLUTION)
IV NS 1000ML BAXH (IV SOLUTION) IMPLANT
IV NS 500ML (IV SOLUTION)
IV NS 500ML BAXH (IV SOLUTION) IMPLANT
KIT FILL SYSTEM UNIVERSAL (SET/KITS/TRAYS/PACK) IMPLANT
LINER CANISTER 1000CC FLEX (MISCELLANEOUS) ×2 IMPLANT
NDL HYPO 25X1 1.5 SAFETY (NEEDLE) IMPLANT
NDL SAFETY ECLIPSE 18X1.5 (NEEDLE) ×1 IMPLANT
NEEDLE HYPO 18GX1.5 SHARP (NEEDLE) ×6
NEEDLE HYPO 25X1 1.5 SAFETY (NEEDLE) ×2 IMPLANT
PACK BASIN DAY SURGERY FS (CUSTOM PROCEDURE TRAY) ×2 IMPLANT
PENCIL BUTTON HOLSTER BLD 10FT (ELECTRODE) ×2 IMPLANT
PIN SAFETY STERILE (MISCELLANEOUS) IMPLANT
SIZER BREAST GEL HP 700CC (SIZER) ×2
SIZER BREAST REUSE 650CC (SIZER) ×2
SIZER BRST GEL HP 700CC (SIZER) IMPLANT
SIZER BRST REUSE P6.4 650CC (SIZER) IMPLANT
SLEEVE SCD COMPRESS KNEE MED (MISCELLANEOUS) ×2 IMPLANT
SPONGE LAP 18X18 X RAY DECT (DISPOSABLE) ×5 IMPLANT
SUT MNCRL AB 4-0 PS2 18 (SUTURE) ×5 IMPLANT
SUT MON AB 3-0 SH 27 (SUTURE) ×10
SUT MON AB 3-0 SH27 (SUTURE) ×1 IMPLANT
SUT MON AB 5-0 PS2 18 (SUTURE) ×5 IMPLANT
SUT PDS AB 2-0 CT2 27 (SUTURE) IMPLANT
SUT VIC AB 3-0 SH 27 (SUTURE)
SUT VIC AB 3-0 SH 27X BRD (SUTURE) IMPLANT
SUT VICRYL 4-0 PS2 18IN ABS (SUTURE) IMPLANT
SYR 50ML LL SCALE MARK (SYRINGE) ×1 IMPLANT
SYR BULB IRRIGATION 50ML (SYRINGE) ×2 IMPLANT
SYR CONTROL 10ML LL (SYRINGE) ×1 IMPLANT
SYR TB 1ML LL NO SAFETY (SYRINGE) ×1 IMPLANT
TOWEL OR 17X24 6PK STRL BLUE (TOWEL DISPOSABLE) ×4 IMPLANT
TUBE CONNECTING 20X1/4 (TUBING) ×2 IMPLANT
TUBING INFILTRATION IT-10001 (TUBING) ×1 IMPLANT
TUBING SET GRADUATE ASPIR 12FT (MISCELLANEOUS) ×1 IMPLANT
UNDERPAD 30X30 (UNDERPADS AND DIAPERS) ×4 IMPLANT
YANKAUER SUCT BULB TIP NO VENT (SUCTIONS) ×2 IMPLANT

## 2017-01-01 NOTE — H&P (Signed)
[name] is an 51 y.o. female.   Chief Complaint: acquired absence of breasts HPI: The patient is a 51 yrs old wf here with her husband for pre operative history and physical prior to exchange surgery with removal of bilateral tissue expanders and placement of bilateral silicone implants.  Expanders currently are 700/350 cc bilaterally.   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 She had a mammogram in November which was negative. She had an MRI in January which showed 2 masses in the LEFT breast 5.2 x 2.4 x 1 cm in the lower outer quadrant and 1.6 x 1.4 x 0.9 cm in the anterior inferior portion. Biopsies on 1/8 showed high-grade ductal carcinoma in situ. She had left breast lobular carcinoma in situ and left lumpectomy 2014 and has been on Aromasin. She did not have radiation.  Past Medical History:  Diagnosis Date  . Abrasion of right arm 12/25/2016  . Complication of anesthesia    panic attack while waking up  . History of breast cancer 2014  . Hypothyroidism   . Migraines   . PONV (postoperative nausea and vomiting)     Past Surgical History:  Procedure Laterality Date  . BREAST LUMPECTOMY WITH NEEDLE LOCALIZATION Left 06/20/2013   Procedure: BREAST LUMPECTOMY WITH NEEDLE LOCALIZATION;  Surgeon: Merrie Roof, MD;  Location: [facility];  Service: General;  Laterality: Left;  . LAPAROSCOPIC VAGINAL HYSTERECTOMY WITH SALPINGO OOPHORECTOMY Bilateral 06/17/2016   Procedure: LAPAROSCOPIC ASSISTED VAGINAL HYSTERECTOMY WITH SALPINGO OOPHORECTOMY;  Surgeon: Arvella Nigh, MD;  Location: [facility];  Service: Gynecology;  Laterality: Bilateral;  need bed  . MASTECTOMY W/ SENTINEL NODE BIOPSY Bilateral 09/08/2016   Procedure: LEFT MASTECTOMY WITH LEFT SENTINEL LYMPH NODE BIOPSY, RIGHT PROPHYLACTIC MASTECTOMY;  Surgeon: Autumn Messing III, MD;  Location: [facility];  Service: General;  Laterality: Bilateral;  . 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 TISSUE EXPANDER PLACEMENT Bilateral 09/08/2016   Procedure: IMMEDIATE PLACEMENT OF BILATERAL TISSUE EXPANDERS AFTER MASTECTOMIES;  Surgeon: Loel Lofty Dillingham, DO;  Location: [facility];  Service: Plastics;  Laterality: Bilateral;    Family History  Problem Relation Age of Onset  . Breast cancer Mother 80  . Heart disease Mother        Atrial fibrillation  . Melanoma Father 77  . Breast cancer Sister 28       ER+/her2-; onco score = 8  . Cancer Maternal Aunt        oral cancer; smoker  . Head & neck cancer Paternal Uncle        smoker  . Breast cancer Cousin        dx in her late 71s-60s   Social History:  reports that she has never smoked. She has never used smokeless tobacco. She reports that she does not drink alcohol or use drugs.  Allergies:  Allergies  Allergen Reactions  . Penicillins Rash    Medications Prior to Admission  Medication Sig Dispense Refill  . Calcium Carbonate-Vitamin D (CALCIUM + D PO) Take 2 tablets by mouth daily.     . cholecalciferol (VITAMIN D) 1000 units tablet Take 1,000 Units by mouth daily.     . cyanocobalamin 500 MCG tablet Take 500 mcg by mouth daily.    Marland Kitchen KRILL OIL OMEGA-3 PO Take 1 tablet by mouth daily.     Marland Kitchen levothyroxine (SYNTHROID, LEVOTHROID) 125 MCG tablet Take 125 mcg by mouth daily before  breakfast.    . magnesium oxide (MAG-OX) 400 MG tablet Take 400 mg by mouth daily.    . Multiple Vitamin (MULTIVITAMIN) tablet Take 1 tablet by mouth daily.    . Riboflavin 400 MG TABS Take by mouth.    . rizatriptan (MAXALT) 10 MG tablet Take 10 mg by mouth as needed for migraine. May repeat in 2 hours if needed    . vitamin C (ASCORBIC ACID) 500 MG tablet Take 500 mg by mouth 2 (two) times daily.      No results found for this or any previous visit (from the past 48 hour(s)). No results found.  Review of Systems  Constitutional: Negative.   HENT: Negative.   Eyes: Negative.   Respiratory: Negative.   Cardiovascular:  Negative.   Gastrointestinal: Negative.   Genitourinary: Negative.   Musculoskeletal: Negative.   Skin: Negative.   Neurological: Negative.   Psychiatric/Behavioral: Negative.     Blood pressure (!) 111/59, pulse 79, temperature 98 F (36.7 C), temperature source Oral, resp. rate 18, height 5' 7.5" (1.715 m), weight 86.2 kg (190 lb), last menstrual period 02/01/2014, SpO2 100 %. Physical Exam  Constitutional: She is oriented to person, place, and time. She appears well-developed and well-nourished.  HENT:  Head: Normocephalic and atraumatic.  Eyes: EOM are normal. Pupils are equal, round, and reactive to light.  Cardiovascular: Normal rate.   Respiratory: Effort normal.  GI: Soft. She exhibits no distension.  Neurological: She is alert and oriented to person, place, and time.  Skin: No rash noted. No erythema. No pallor.  Psychiatric: She has a normal mood and affect. Her behavior is normal. Judgment and thought content normal.     Assessment/Plan Plan for removal of breast expanders and placement of silicone implants.  Wallace Going, DO 01/01/2017, 7:09 AM

## 2017-01-01 NOTE — Anesthesia Preprocedure Evaluation (Signed)
Anesthesia Evaluation  Patient identified by MRN, date of birth, ID band Patient awake    History of Anesthesia Complications (+) PONV and history of anesthetic complications  Airway Mallampati: II  TM Distance: >3 FB Neck ROM: Full    Dental  (+) Teeth Intact, Dental Advisory Given   Pulmonary neg pulmonary ROS,    breath sounds clear to auscultation       Cardiovascular + Peripheral Vascular Disease   Rhythm:Regular Rate:Normal     Neuro/Psych  Headaches,    GI/Hepatic   Endo/Other  Hypothyroidism   Renal/GU      Musculoskeletal   Abdominal   Peds  Hematology   Anesthesia Other Findings   Reproductive/Obstetrics                             Anesthesia Physical  Anesthesia Plan  ASA: II  Anesthesia Plan: General   Post-op Pain Management:  Regional for Post-op pain   Induction: Intravenous  PONV Risk Score and Plan: 4 or greater and Ondansetron, Dexamethasone, Propofol, Midazolam, Scopolamine patch - Pre-op and Treatment may vary due to age  Airway Management Planned: Oral ETT  Additional Equipment:   Intra-op Plan:   Post-operative Plan: Extubation in OR  Informed Consent: I have reviewed the patients History and Physical, chart, labs and discussed the procedure including the risks, benefits and alternatives for the proposed anesthesia with the patient or authorized representative who has indicated his/her understanding and acceptance.   Dental advisory given  Plan Discussed with: CRNA and Anesthesiologist  Anesthesia Plan Comments:         Anesthesia Quick Evaluation

## 2017-01-01 NOTE — Anesthesia Procedure Notes (Signed)
Procedure Name: Intubation Date/Time: 01/01/2017 7:43 AM Performed by: Melynda Ripple D Pre-anesthesia Checklist: Patient identified, Emergency Drugs available, Suction available and Patient being monitored Patient Re-evaluated:Patient Re-evaluated prior to inductionOxygen Delivery Method: Circle system utilized Preoxygenation: Pre-oxygenation with 100% oxygen Intubation Type: IV induction Ventilation: Mask ventilation without difficulty Laryngoscope Size: Mac and 3 Grade View: Grade II Tube type: Oral Number of attempts: 2 (intubated byy dr Sabra Heck) Airway Equipment and Method: Stylet and Oral airway Placement Confirmation: ETT inserted through vocal cords under direct vision,  positive ETCO2 and breath sounds checked- equal and bilateral Secured at: 23 cm Tube secured with: Tape Dental Injury: Teeth and Oropharynx as per pre-operative assessment  Difficulty Due To: Difficult Airway- due to anterior larynx

## 2017-01-01 NOTE — Discharge Instructions (Addendum)
Follow up in one week No heavy lifting May shower tomorrow Continue binder or sports bra     Post Anesthesia Home Care Instructions  Activity: Get plenty of rest for the remainder of the day. A responsible individual must stay with you for 24 hours following the procedure.  For the next 24 hours, DO NOT: -Drive a car -Paediatric nurse -Drink alcoholic beverages -Take any medication unless instructed by your physician -Make any legal decisions or sign important papers.  Meals: Start with liquid foods such as gelatin or soup. Progress to regular foods as tolerated. Avoid greasy, spicy, heavy foods. If nausea and/or vomiting occur, drink only clear liquids until the nausea and/or vomiting subsides. Call your physician if vomiting continues.  Special Instructions/Symptoms: Your throat may feel dry or sore from the anesthesia or the breathing tube placed in your throat during surgery. If this causes discomfort, gargle with warm salt water. The discomfort should disappear within 24 hours.  If you had a scopolamine patch placed behind your ear for the management of post- operative nausea and/or vomiting:  1. The medication in the patch is effective for 72 hours, after which it should be removed.  Wrap patch in a tissue and discard in the trash. Wash hands thoroughly with soap and water. 2. You may remove the patch earlier than 72 hours if you experience unpleasant side effects which may include dry mouth, dizziness or visual disturbances. 3. Avoid touching the patch. Wash your hands with soap and water after contact with the patch.

## 2017-01-01 NOTE — Transfer of Care (Signed)
Immediate Anesthesia Transfer of Care Note  Patient: VALREE FEILD  Procedure(s) Performed: Procedure(s): REMOVAL OF BILATERAL TISSUE EXPANDERS WITH PLACEMENT OF BILATERAL SILICONE BREAST IMPLANTS WITH LIPOSUCTION (Bilateral)  Patient Location: PACU  Anesthesia Type:General  Level of Consciousness: awake, alert , oriented and drowsy  Airway & Oxygen Therapy: Patient Spontanous Breathing and Patient connected to face mask oxygen  Post-op Assessment: Report given to RN and Post -op Vital signs reviewed and stable  Post vital signs: Reviewed and stable  Last Vitals:  Vitals:   01/01/17 0636  BP: (!) 111/59  Pulse: 79  Resp: 18  Temp: 36.7 C    Last Pain:  Vitals:   01/01/17 0636  TempSrc: Oral         Complications: No apparent anesthesia complications

## 2017-01-01 NOTE — Op Note (Signed)
Op report Bilateral Exchange   DATE OF OPERATION: 01/01/2017  LOCATION: [place]  SURGICAL DIVISION: Plastic Surgery  PREOPERATIVE DIAGNOSES:  1.History of breast cancer.  2. Acquired absence of bilateral breast.   POSTOPERATIVE DIAGNOSES:  1. History of breast cancer.  2. Acquired absence of bilateral breast.   PROCEDURE:  1. Bilateral exchange of tissue expanders for implants.  2. Bilateral capsulotomies for implant respositioning.  SURGEON: Claire Sanger Dillingham, DO  ASSISTANT: Shawn Rayburn, PA  ANESTHESIA:  General.   COMPLICATIONS: None.   IMPLANTS: Left - Mentor Smooth Round Ultra High Profile Gel 700cc. Ref #[id number].  Serial Number [id number] Right - Mentor Smooth Round Ultra High Profile Gel 650cc. Ref #[id number].  Serial Number [id number]  INDICATIONS FOR PROCEDURE:  The patient, [name], is a 51 y.o. female born on 07-Sep-1965, is here for treatment after bilateral mastectomies.  She had tissue expanders placed at the time of mastectomies. She now presents for exchange of her expanders for implants.  She requires capsulotomies to better position the implants. MRN: [id number]  CONSENT:  Informed consent was obtained directly from the patient. Risks, benefits and alternatives were fully discussed. Specific risks including but not limited to bleeding, infection, hematoma, seroma, scarring, pain, implant infection, implant extrusion, capsular contracture, asymmetry, wound healing problems, and need for further surgery were all discussed. The patient did have an ample opportunity to have her questions answered to her satisfaction.   DESCRIPTION OF PROCEDURE:  The patient was taken to the operating room. SCDs were placed and IV antibiotics were given. The patient's chest was prepped and draped in a sterile fashion. A time out was performed and the implants to be used were identified.  Tumescent was placed in the lateral axillary area of  each breast.   On the right breast: One percent Lidocaine with epinephrine was used to infiltrate at the incision site. The old mastectomy scar was excised.  The mastectomy flaps from the superior and inferior flaps were raised over the pectoralis major muscle for several centimeters to minimize tension for the closure. The pectoralis was split inferior to the skin incision to expose and remove the tissue expander.  Inspection of the pocket showed a normal healthy capsule and good integration of the biologic matrix.  The pocket was irrigated with saline solution.  A portion of the capsule medially was excised for better positioning.  Additional skin excision was done laterally for improved contour. Liposuction was done laterally through a small lateral incision.  Circumferential capsulotomies were performed to allow for breast pocket expansion.  Measurements were made and a sizer used to confirm adequate pocket size for the implant dimensions.  Hemostasis was ensured with electrocautery. New gloves were placed. The implant was soaked in saline solution and then placed in the pocket and oriented appropriately. The pectoralis major muscle and capsule on the anterior surface were re-closed with a 3-0 Monocryl suture. The remaining skin was closed with 4-0 Monocryl deep dermal and 5-0 Monocryl subcuticular stitches.   On the left breast: The old mastectomy scar was excised.  The mastectomy flaps from the superior and inferior flaps were raised over the pectoralis major muscle for several centimeters to minimize tension for the closure. The pectoralis was split inferior to the skin incision to expose and remove the tissue expander.  Inspection of the pocket showed a normal healthy capsule and good integration of the biologic matrix. Liposuction was done laterally through a separate small incision.  Circumferential capsulotomies were performed  to allow for breast pocket expansion.  Measurements were made and a  sizer utilized to confirm adequate pocket size for the implant dimensions.  There was asymmetry noted with the 700 cc sizer but the symmetry was improved with the 650 cc on the right and 700 cc on the left. Hemostasis was ensured with the electrocautery.  New gloves were applied. The implant was soaked in saline solution and placed in the pocket and oriented appropriately. The pectoralis major muscle and capsule on the anterior surface were re-closed with a 3-0 Monocryl suture. The remaining skin was closed with 4-0 Monocryl deep dermal and 5-0 Monocryl subcuticular stitches.  Dermabond was applied to the incision site. A breast binder and ABDs were placed.  The patient was allowed to wake from anesthesia and taken to the recovery room in satisfactory condition.

## 2017-01-01 NOTE — Anesthesia Postprocedure Evaluation (Signed)
Anesthesia Post Note  Patient: [name]  Procedure(s) Performed: Procedure(s) (LRB): REMOVAL OF BILATERAL TISSUE EXPANDERS WITH PLACEMENT OF BILATERAL SILICONE BREAST IMPLANTS WITH LIPOSUCTION (Bilateral)     Patient location during evaluation: PACU Anesthesia Type: General Level of consciousness: awake and alert Pain management: pain level controlled Vital Signs Assessment: post-procedure vital signs reviewed and stable Respiratory status: spontaneous breathing, nonlabored ventilation and respiratory function stable Cardiovascular status: blood pressure returned to baseline and stable Postop Assessment: no signs of nausea or vomiting Anesthetic complications: no    Last Vitals:  Vitals:   01/01/17 1122 01/01/17 1130  BP:  110/75  Pulse: (!) 110 (!) 107  Resp: 17 12  Temp:      Last Pain:  Vitals:   01/01/17 1140  TempSrc:   PainSc: [place]

## 2017-01-03 ENCOUNTER — Encounter (HOSPITAL_BASED_OUTPATIENT_CLINIC_OR_DEPARTMENT_OTHER): Payer: Self-pay | Admitting: Plastic Surgery

## 2017-01-29 ENCOUNTER — Ambulatory Visit (HOSPITAL_BASED_OUTPATIENT_CLINIC_OR_DEPARTMENT_OTHER): Payer: BC Managed Care – PPO | Admitting: Hematology & Oncology

## 2017-01-29 ENCOUNTER — Other Ambulatory Visit (HOSPITAL_BASED_OUTPATIENT_CLINIC_OR_DEPARTMENT_OTHER): Payer: BC Managed Care – PPO

## 2017-01-29 VITALS — BP 111/63 | HR 79 | Temp 98.3°F | Resp 16 | Wt 192.0 lb

## 2017-01-29 DIAGNOSIS — D0512 Intraductal carcinoma in situ of left breast: Secondary | ICD-10-CM | POA: Diagnosis not present

## 2017-01-29 DIAGNOSIS — Z9013 Acquired absence of bilateral breasts and nipples: Secondary | ICD-10-CM | POA: Diagnosis not present

## 2017-01-29 DIAGNOSIS — M818 Other osteoporosis without current pathological fracture: Secondary | ICD-10-CM | POA: Insufficient documentation

## 2017-01-29 LAB — CBC WITH DIFFERENTIAL (CANCER CENTER ONLY)
BASO#: 0 10*3/uL (ref 0.0–0.2)
BASO%: 0.7 % (ref 0.0–2.0)
EOS ABS: 0.3 10*3/uL (ref 0.0–0.5)
EOS%: 6.2 % (ref 0.0–7.0)
HCT: 38.7 % (ref 34.8–46.6)
HEMOGLOBIN: 12.7 g/dL (ref 11.6–15.9)
LYMPH#: 2 10*3/uL (ref 0.9–3.3)
LYMPH%: 48.6 % — ABNORMAL HIGH (ref 14.0–48.0)
MCH: 28.7 pg (ref 26.0–34.0)
MCHC: 32.8 g/dL (ref 32.0–36.0)
MCV: 87 fL (ref 81–101)
MONO#: 0.5 10*3/uL (ref 0.1–0.9)
MONO%: 11.1 % (ref 0.0–13.0)
NEUT#: 1.4 10*3/uL — ABNORMAL LOW (ref 1.5–6.5)
NEUT%: 33.4 % — AB (ref 39.6–80.0)
Platelets: 270 10*3/uL (ref 145–400)
RBC: 4.43 10*6/uL (ref 3.70–5.32)
RDW: 14.9 % (ref 11.1–15.7)
WBC: 4.1 10*3/uL (ref 3.9–10.0)

## 2017-01-29 LAB — CMP (CANCER CENTER ONLY)
ALBUMIN: 3.5 g/dL (ref 3.3–5.5)
ALT(SGPT): 18 U/L (ref 10–47)
AST: 19 U/L (ref 11–38)
Alkaline Phosphatase: 70 U/L (ref 26–84)
BUN, Bld: 7 mg/dL (ref 7–22)
CALCIUM: 9.3 mg/dL (ref 8.0–10.3)
CO2: 30 meq/L (ref 18–33)
Chloride: 105 mEq/L (ref 98–108)
Creat: 0.8 mg/dl (ref 0.6–1.2)
GLUCOSE: 98 mg/dL (ref 73–118)
POTASSIUM: 3.8 meq/L (ref 3.3–4.7)
Sodium: 139 mEq/L (ref 128–145)
Total Bilirubin: 0.6 mg/dl (ref 0.20–1.60)
Total Protein: 6.6 g/dL (ref 6.4–8.1)

## 2017-01-29 NOTE — Progress Notes (Signed)
Hematology and Oncology Follow Up Visit  [name] [id number] [birth year] 51 y.o. 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 [name] did have [name] bilateral mastectomy. This is done on February 12. The pathology report ([id number]) shows high-grade ductal carcinoma in situ. [name] had 2 foci measuring 1.1 and 0.7 cm. All margins were negative. An anterior margin was focally less than 0.1 cm. Surprisingly, the right breast showed focal ductal hyperplasia. A sentinel lymph node was taken in the left ankle which was negative.  [name] had expanders placed. They have been removed. [name] will have lipophilic and in the fall.  [name] is very busy taking care of [name] parents. There are both in declining health. [name] really is their primary caregiver. [name] mother fell recently. Thankfully, [name] did not break anything.  [name] and [name] husband were busy over at [name] parent's house for most of July 4.  [name]'s had no problems with cough. There's been no problems with nausea or vomiting. [name]'s had no change in bowel or bladder habits. [name]'s had no hot flashes.   There's been no issues with lymphedema. [name] has good range of motion of [name] shoulders.   Overall, [name] performance status is ECOG 1.  Medications:  Current Outpatient Prescriptions:  .  Calcium Carbonate-Vitamin D (CALCIUM + D PO), Take 2 tablets by mouth daily. , Disp: , Rfl:  .  cholecalciferol (VITAMIN D) 1000 units tablet, Take 1,000 Units by mouth daily. , Disp: , Rfl:  .  cyanocobalamin 500 MCG tablet, Take 500 mcg by mouth daily., Disp: , Rfl:  .  KRILL OIL OMEGA-3 PO, Take 1 tablet by mouth daily. , Disp: , Rfl:  .  levothyroxine (SYNTHROID, LEVOTHROID) 125 MCG tablet, Take 125 mcg by mouth daily before breakfast., Disp: , Rfl:  .  magnesium oxide (MAG-OX) 400 MG tablet, Take 400 mg by mouth daily., Disp: ,  Rfl:  .  Multiple Vitamin (MULTIVITAMIN) tablet, Take 1 tablet by mouth daily., Disp: , Rfl:  .  Riboflavin 400 MG TABS, Take by mouth., Disp: , Rfl:  .  rizatriptan (MAXALT) 10 MG tablet, Take 10 mg by mouth as needed for migraine. May repeat in 2 hours if needed, Disp: , Rfl:  .  vitamin C (ASCORBIC ACID) 500 MG tablet, Take 500 mg by mouth 2 (two) times daily., Disp: , Rfl:   Allergies:  Allergies  Allergen Reactions  . Penicillins Rash    Past Medical History, Surgical history, Social history, and Family History were reviewed and updated.  Review of Systems: As above  Physical Exam:  vitals were not taken for this visit.  Wt Readings from Last 3 Encounters:  01/01/17 190 lb (86.2 kg)  09/25/16 196 lb (88.9 kg)  09/02/16 192 lb 11.2 oz (87.4 kg)     Well-developed and well-nourished white female. Head and neck exam shows no ocular or oral lesions. [name] has no palpable cervical or supraclavicular lymph nodes. Lungs are clear bilaterally. Cardiac exam regular rate and rhythm with no murmurs, rubs or bruits. Chest wall exam shows bilateral mastectomies. There are healing nicely. There is no erythema. [name] has no nodularity. [name] does have the drainage tube under the left chest wall. Abdomen is soft. [name] has good bowel sounds. There is no fluid wave. There is no palpable liver or spleen tip. Extremities shows  no clubbing, cyanosis or edema. [name] has no lymphedema in [name] arms. Skin exam shows no rashes, ecchymoses or petechia.  Lab Results  Component Value Date   WBC 4.1 01/29/2017   HGB 12.7 01/29/2017   HCT 38.7 01/29/2017   MCV 87 01/29/2017   PLT 270 01/29/2017     Chemistry      Component Value Date/Time   NA 134 09/25/2016 1312   NA 136 08/13/2016 1149   NA 138 05/30/2016 0746   K 4.1 09/25/2016 1312   K 3.5 08/13/2016 1149   K 3.8 05/30/2016 0746   CL 100 09/25/2016 1312   CL 102 08/13/2016 1149   CO2 27 09/25/2016 1312   CO2 29 08/13/2016 1149   CO2 25 05/30/2016  0746   BUN 13 09/25/2016 1312   BUN 10 08/13/2016 1149   BUN 10.0 05/30/2016 0746   CREATININE 0.63 09/25/2016 1312   CREATININE 0.7 08/13/2016 1149   CREATININE 0.7 05/30/2016 0746      Component Value Date/Time   CALCIUM 9.6 09/25/2016 1312   CALCIUM 9.4 08/13/2016 1149   CALCIUM 9.2 05/30/2016 0746   ALKPHOS 88 09/25/2016 1312   ALKPHOS 65 08/13/2016 1149   ALKPHOS 71 05/30/2016 0746   AST 20 09/25/2016 1312   AST 22 08/13/2016 1149   AST 16 05/30/2016 0746   ALT 16 09/25/2016 1312   ALT 21 08/13/2016 1149   ALT 11 05/30/2016 0746   BILITOT <0.2 09/25/2016 1312   BILITOT 0.80 08/13/2016 1149   BILITOT 0.83 05/30/2016 0746         Impression and Plan: [name] is a 51 year old white female. [name] had ductal carcinoma in situ of the left breast. This was multifocal. [name] underwent bilateral mastectomies.  At this point, we will plan to get [name] back in 6 months. I think this would be a good follow-up.  Told [name] that [name] has to take vitamin D. I think 2000 units daily would be reasonable.  I also told [name] to take baby aspirin. I think that 81 mg a day with food would also be helpful.  We will be more than happy to see [name] back sooner if there is a problem.   [name], MD 7/5/20187:59 AM

## 2017-01-30 LAB — VITAMIN D 25 HYDROXY (VIT D DEFICIENCY, FRACTURES): Vitamin D, 25-Hydroxy: 37.9 ng/mL (ref 30.0–100.0)

## 2017-02-02 ENCOUNTER — Encounter: Payer: Self-pay | Admitting: *Deleted

## 2017-04-02 ENCOUNTER — Encounter (HOSPITAL_BASED_OUTPATIENT_CLINIC_OR_DEPARTMENT_OTHER): Payer: Self-pay | Admitting: *Deleted

## 2017-04-03 ENCOUNTER — Ambulatory Visit: Payer: Self-pay | Admitting: Plastic Surgery

## 2017-04-03 DIAGNOSIS — Z9012 Acquired absence of left breast and nipple: Secondary | ICD-10-CM

## 2017-04-08 NOTE — Anesthesia Preprocedure Evaluation (Addendum)
Anesthesia Evaluation  Patient identified by MRN, date of birth, ID band Patient awake    Reviewed: Allergy & Precautions, NPO status , Patient's Chart, lab work & pertinent test results  History of Anesthesia Complications (+) PONV and history of anesthetic complications  Airway Mallampati: II  TM Distance: >3 FB Neck ROM: Full    Dental  (+) Teeth Intact, Dental Advisory Given   Pulmonary neg pulmonary ROS,    breath sounds clear to auscultation       Cardiovascular + Peripheral Vascular Disease   Rhythm:Regular Rate:Normal     Neuro/Psych  Headaches, negative psych ROS   GI/Hepatic negative GI ROS, Neg liver ROS,   Endo/Other  Hypothyroidism   Renal/GU negative Renal ROS     Musculoskeletal   Abdominal   Peds  Hematology   Anesthesia Other Findings   Reproductive/Obstetrics                            Anesthesia Physical  Anesthesia Plan  ASA: II  Anesthesia Plan: General   Post-op Pain Management:    Induction: Intravenous  PONV Risk Score and Plan: 4 or greater and Ondansetron, Dexamethasone, Scopolamine patch - Pre-op and Propofol infusion  Airway Management Planned: Oral ETT  Additional Equipment:   Intra-op Plan:   Post-operative Plan: Extubation in OR  Informed Consent: I have reviewed the patients History and Physical, chart, labs and discussed the procedure including the risks, benefits and alternatives for the proposed anesthesia with the patient or authorized representative who has indicated his/her understanding and acceptance.   Dental advisory given  Plan Discussed with: CRNA, Anesthesiologist and Surgeon  Anesthesia Plan Comments:        Anesthesia Quick Evaluation

## 2017-04-09 ENCOUNTER — Encounter (HOSPITAL_BASED_OUTPATIENT_CLINIC_OR_DEPARTMENT_OTHER): Payer: Self-pay

## 2017-04-09 ENCOUNTER — Encounter (HOSPITAL_BASED_OUTPATIENT_CLINIC_OR_DEPARTMENT_OTHER)
Admission: RE | Disposition: A | Discharge status: Home / Self Care | Payer: Self-pay | Source: Ambulatory Visit | Attending: Plastic Surgery

## 2017-04-09 ENCOUNTER — Ambulatory Visit (HOSPITAL_BASED_OUTPATIENT_CLINIC_OR_DEPARTMENT_OTHER): Payer: BC Managed Care – PPO | Admitting: Anesthesiology

## 2017-04-09 ENCOUNTER — Ambulatory Visit (HOSPITAL_BASED_OUTPATIENT_CLINIC_OR_DEPARTMENT_OTHER)
Admission: RE | Admit: 2017-04-09 | Discharge: 2017-04-09 | Disposition: A | Discharge status: Home / Self Care | Payer: BC Managed Care – PPO | Source: Ambulatory Visit | Attending: Plastic Surgery | Admitting: Plastic Surgery

## 2017-04-09 DIAGNOSIS — Z79899 Other long term (current) drug therapy: Secondary | ICD-10-CM | POA: Diagnosis not present

## 2017-04-09 DIAGNOSIS — I739 Peripheral vascular disease, unspecified: Secondary | ICD-10-CM | POA: Diagnosis not present

## 2017-04-09 DIAGNOSIS — N6489 Other specified disorders of breast: Secondary | ICD-10-CM | POA: Insufficient documentation

## 2017-04-09 DIAGNOSIS — Z9012 Acquired absence of left breast and nipple: Secondary | ICD-10-CM

## 2017-04-09 DIAGNOSIS — E039 Hypothyroidism, unspecified: Secondary | ICD-10-CM | POA: Insufficient documentation

## 2017-04-09 DIAGNOSIS — Z803 Family history of malignant neoplasm of breast: Secondary | ICD-10-CM | POA: Diagnosis not present

## 2017-04-09 DIAGNOSIS — Z9013 Acquired absence of bilateral breasts and nipples: Secondary | ICD-10-CM | POA: Insufficient documentation

## 2017-04-09 DIAGNOSIS — Z7982 Long term (current) use of aspirin: Secondary | ICD-10-CM | POA: Insufficient documentation

## 2017-04-09 DIAGNOSIS — Z880 Allergy status to penicillin: Secondary | ICD-10-CM | POA: Diagnosis not present

## 2017-04-09 DIAGNOSIS — Z853 Personal history of malignant neoplasm of breast: Secondary | ICD-10-CM | POA: Diagnosis not present

## 2017-04-09 HISTORY — PX: BREAST RECONSTRUCTION: SHX9

## 2017-04-09 HISTORY — PX: LIPOSUCTION WITH LIPOFILLING: SHX6436

## 2017-04-09 SURGERY — RECONSTRUCTION, BREAST
Anesthesia: General | Site: Breast | Laterality: Left

## 2017-04-09 MED ORDER — PHENYLEPHRINE 40 MCG/ML (10ML) SYRINGE FOR IV PUSH (FOR BLOOD PRESSURE SUPPORT)
PREFILLED_SYRINGE | INTRAVENOUS | Status: AC
  Filled 2017-04-09: qty 10

## 2017-04-09 MED ORDER — ONDANSETRON HCL 4 MG/2ML IJ SOLN
INTRAMUSCULAR | Status: DC | PRN
  Administered 2017-04-09: 4 mg via INTRAVENOUS

## 2017-04-09 MED ORDER — EPHEDRINE SULFATE 50 MG/ML IJ SOLN
INTRAMUSCULAR | Status: DC | PRN
  Administered 2017-04-09: 10 mg via INTRAVENOUS

## 2017-04-09 MED ORDER — SODIUM BICARBONATE 4 % IV SOLN
INTRAVENOUS | Status: DC | PRN
  Administered 2017-04-09: 1000 mL via INTRAMUSCULAR

## 2017-04-09 MED ORDER — SCOPOLAMINE 1 MG/3DAYS TD PT72
MEDICATED_PATCH | TRANSDERMAL | Status: AC
  Filled 2017-04-09: qty 1

## 2017-04-09 MED ORDER — MIDAZOLAM HCL 2 MG/2ML IJ SOLN
1.0000 mg | INTRAMUSCULAR | Status: DC | PRN
  Administered 2017-04-09: 2 mg via INTRAVENOUS
  Administered 2017-04-09: 1 mg via INTRAVENOUS

## 2017-04-09 MED ORDER — HYDROMORPHONE HCL 1 MG/ML IJ SOLN
0.2500 mg | INTRAMUSCULAR | Status: DC | PRN
  Administered 2017-04-09: 0.5 mg via INTRAVENOUS

## 2017-04-09 MED ORDER — PROPOFOL 500 MG/50ML IV EMUL
INTRAVENOUS | Status: DC | PRN
  Administered 2017-04-09: 30 ug/kg/min via INTRAVENOUS

## 2017-04-09 MED ORDER — FENTANYL CITRATE (PF) 100 MCG/2ML IJ SOLN
50.0000 ug | INTRAMUSCULAR | Status: DC | PRN
  Administered 2017-04-09: 50 ug via INTRAVENOUS
  Administered 2017-04-09: 100 ug via INTRAVENOUS

## 2017-04-09 MED ORDER — FENTANYL CITRATE (PF) 100 MCG/2ML IJ SOLN
INTRAMUSCULAR | Status: AC
  Filled 2017-04-09: qty 2

## 2017-04-09 MED ORDER — EPINEPHRINE 30 MG/30ML IJ SOLN
INTRAMUSCULAR | Status: AC
  Filled 2017-04-09: qty 1

## 2017-04-09 MED ORDER — MIDAZOLAM HCL 2 MG/2ML IJ SOLN
INTRAMUSCULAR | Status: AC
  Filled 2017-04-09: qty 2

## 2017-04-09 MED ORDER — DEXAMETHASONE SODIUM PHOSPHATE 4 MG/ML IJ SOLN
INTRAMUSCULAR | Status: DC | PRN
  Administered 2017-04-09: 10 mg via INTRAVENOUS

## 2017-04-09 MED ORDER — PROPOFOL 10 MG/ML IV BOLUS
INTRAVENOUS | Status: DC | PRN
  Administered 2017-04-09: 200 mg via INTRAVENOUS

## 2017-04-09 MED ORDER — LIDOCAINE HCL (PF) 1 % IJ SOLN
INTRAMUSCULAR | Status: AC
  Filled 2017-04-09: qty 60

## 2017-04-09 MED ORDER — SCOPOLAMINE 1 MG/3DAYS TD PT72: 1.0000 | MEDICATED_PATCH | TRANSDERMAL | Status: DC

## 2017-04-09 MED ORDER — LIDOCAINE HCL 1 % IJ SOLN
INTRAMUSCULAR | Status: DC | PRN
  Administered 2017-04-09: 8 mL

## 2017-04-09 MED ORDER — ONDANSETRON HCL 4 MG/2ML IJ SOLN
INTRAMUSCULAR | Status: AC
  Filled 2017-04-09: qty 2

## 2017-04-09 MED ORDER — LIDOCAINE HCL (CARDIAC) 20 MG/ML IV SOLN
INTRAVENOUS | Status: DC | PRN
  Administered 2017-04-09: 50 mg via INTRAVENOUS

## 2017-04-09 MED ORDER — BUPIVACAINE HCL (PF) 0.5 % IJ SOLN
INTRAMUSCULAR | Status: AC
  Filled 2017-04-09: qty 60

## 2017-04-09 MED ORDER — HYDROMORPHONE HCL 1 MG/ML IJ SOLN
INTRAMUSCULAR | Status: AC
  Filled 2017-04-09: qty 0.5

## 2017-04-09 MED ORDER — LIDOCAINE-EPINEPHRINE 2 %-1:100000 IJ SOLN
INTRAMUSCULAR | Status: AC
  Filled 2017-04-09: qty 3

## 2017-04-09 MED ORDER — PROMETHAZINE HCL 25 MG/ML IJ SOLN: 6.2500 mg | INTRAMUSCULAR | Status: DC | PRN

## 2017-04-09 MED ORDER — PHENYLEPHRINE HCL 10 MG/ML IJ SOLN
INTRAMUSCULAR | Status: DC | PRN
  Administered 2017-04-09: 40 ug via INTRAVENOUS

## 2017-04-09 MED ORDER — DEXAMETHASONE SODIUM PHOSPHATE 10 MG/ML IJ SOLN
INTRAMUSCULAR | Status: AC
  Filled 2017-04-09: qty 1

## 2017-04-09 MED ORDER — SCOPOLAMINE 1 MG/3DAYS TD PT72
1.0000 | MEDICATED_PATCH | Freq: Once | TRANSDERMAL | Status: DC | PRN
  Administered 2017-04-09: 1.5 mg via TRANSDERMAL

## 2017-04-09 MED ORDER — CIPROFLOXACIN IN D5W 400 MG/200ML IV SOLN
INTRAVENOUS | Status: AC
  Filled 2017-04-09: qty 200

## 2017-04-09 MED ORDER — EPHEDRINE 5 MG/ML INJ
INTRAVENOUS | Status: AC
  Filled 2017-04-09: qty 10

## 2017-04-09 MED ORDER — LACTATED RINGERS IV SOLN
INTRAVENOUS | Status: DC
  Administered 2017-04-09: 07:00:00 via INTRAVENOUS

## 2017-04-09 MED ORDER — SUCCINYLCHOLINE CHLORIDE 200 MG/10ML IV SOSY
PREFILLED_SYRINGE | INTRAVENOUS | Status: AC
  Filled 2017-04-09: qty 10

## 2017-04-09 MED ORDER — LIDOCAINE 2% (20 MG/ML) 5 ML SYRINGE
INTRAMUSCULAR | Status: AC
  Filled 2017-04-09: qty 5

## 2017-04-09 MED ORDER — CIPROFLOXACIN IN D5W 400 MG/200ML IV SOLN
400.0000 mg | INTRAVENOUS | Status: AC
  Administered 2017-04-09: 400 mg via INTRAVENOUS

## 2017-04-09 MED ORDER — LIDOCAINE-EPINEPHRINE 1 %-1:100000 IJ SOLN
INTRAMUSCULAR | Status: AC
  Filled 2017-04-09: qty 3

## 2017-04-09 SURGICAL SUPPLY — 75 items
ADH SKN CLS APL DERMABOND .7 (GAUZE/BANDAGES/DRESSINGS) ×1
BAG DECANTER FOR FLEXI CONT (MISCELLANEOUS) ×2 IMPLANT
BINDER ABDOMINAL  9 SM 30-45 (SOFTGOODS)
BINDER ABDOMINAL 10 UNV 27-48 (MISCELLANEOUS) ×1 IMPLANT
BINDER ABDOMINAL 12 SM 30-45 (SOFTGOODS) IMPLANT
BINDER ABDOMINAL 9 SM 30-45 (SOFTGOODS) IMPLANT
BINDER BREAST LRG (GAUZE/BANDAGES/DRESSINGS) IMPLANT
BINDER BREAST MEDIUM (GAUZE/BANDAGES/DRESSINGS) IMPLANT
BINDER BREAST XLRG (GAUZE/BANDAGES/DRESSINGS) ×1 IMPLANT
BINDER BREAST XXLRG (GAUZE/BANDAGES/DRESSINGS) IMPLANT
BIOPATCH RED 1 DISK 7.0 (GAUZE/BANDAGES/DRESSINGS) IMPLANT
BLADE HEX COATED 2.75 (ELECTRODE) ×2 IMPLANT
BLADE SURG 15 STRL LF DISP TIS (BLADE) ×1 IMPLANT
BLADE SURG 15 STRL SS (BLADE) ×2
BNDG GAUZE ELAST 4 BULKY (GAUZE/BANDAGES/DRESSINGS) ×4 IMPLANT
CANISTER SUCT 1200ML W/VALVE (MISCELLANEOUS) ×2 IMPLANT
CHLORAPREP W/TINT 26ML (MISCELLANEOUS) ×2 IMPLANT
COVER BACK TABLE 60X90IN (DRAPES) ×2 IMPLANT
COVER MAYO STAND STRL (DRAPES) ×2 IMPLANT
DECANTER SPIKE VIAL GLASS SM (MISCELLANEOUS) ×4 IMPLANT
DERMABOND ADVANCED (GAUZE/BANDAGES/DRESSINGS) ×1
DERMABOND ADVANCED .7 DNX12 (GAUZE/BANDAGES/DRESSINGS) ×1 IMPLANT
DRAIN CHANNEL 19F RND (DRAIN) IMPLANT
DRAPE LAPAROSCOPIC ABDOMINAL (DRAPES) ×2 IMPLANT
DRSG PAD ABDOMINAL 8X10 ST (GAUZE/BANDAGES/DRESSINGS) ×4 IMPLANT
ELECT BLADE 4.0 EZ CLEAN MEGAD (MISCELLANEOUS) ×2
ELECT BLADE 6.5 .24CM SHAFT (ELECTRODE) ×1 IMPLANT
ELECT REM PT RETURN 9FT ADLT (ELECTROSURGICAL) ×2
ELECTRODE BLDE 4.0 EZ CLN MEGD (MISCELLANEOUS) ×1 IMPLANT
ELECTRODE REM PT RTRN 9FT ADLT (ELECTROSURGICAL) ×1 IMPLANT
EVACUATOR SILICONE 100CC (DRAIN) IMPLANT
EXTRACTOR CANIST REVOLVE STRL (CANNISTER) ×2 IMPLANT
GAUZE SPONGE 4X4 12PLY STRL LF (GAUZE/BANDAGES/DRESSINGS) IMPLANT
GLOVE BIO SURGEON STRL SZ 6.5 (GLOVE) ×4 IMPLANT
GOWN STRL REUS W/ TWL LRG LVL3 (GOWN DISPOSABLE) ×3 IMPLANT
GOWN STRL REUS W/TWL LRG LVL3 (GOWN DISPOSABLE) ×6
IV LACTATED RINGERS 1000ML (IV SOLUTION) ×4 IMPLANT
IV NS 500ML (IV SOLUTION) ×2
IV NS 500ML BAXH (IV SOLUTION) ×1 IMPLANT
KIT FILL SYSTEM UNIVERSAL (SET/KITS/TRAYS/PACK) IMPLANT
LINER CANISTER 1000CC FLEX (MISCELLANEOUS) ×2 IMPLANT
NDL HYPO 25X1 1.5 SAFETY (NEEDLE) IMPLANT
NDL SAFETY ECLIPSE 18X1.5 (NEEDLE) ×1 IMPLANT
NEEDLE HYPO 18GX1.5 SHARP (NEEDLE) ×2
NEEDLE HYPO 25X1 1.5 SAFETY (NEEDLE) ×2 IMPLANT
NS IRRIG 1000ML POUR BTL (IV SOLUTION) IMPLANT
PACK BASIN DAY SURGERY FS (CUSTOM PROCEDURE TRAY) ×2 IMPLANT
PAD ALCOHOL SWAB (MISCELLANEOUS) ×2 IMPLANT
PENCIL BUTTON HOLSTER BLD 10FT (ELECTRODE) ×2 IMPLANT
PIN SAFETY STERILE (MISCELLANEOUS) IMPLANT
SLEEVE SCD COMPRESS KNEE MED (MISCELLANEOUS) ×2 IMPLANT
SPONGE LAP 18X18 X RAY DECT (DISPOSABLE) ×4 IMPLANT
SUT MNCRL AB 4-0 PS2 18 (SUTURE) IMPLANT
SUT MON AB 3-0 SH 27 (SUTURE) ×2
SUT MON AB 3-0 SH27 (SUTURE) ×1 IMPLANT
SUT MON AB 5-0 PS2 18 (SUTURE) ×4 IMPLANT
SUT PDS 3-0 CT2 (SUTURE)
SUT PDS AB 2-0 CT2 27 (SUTURE) IMPLANT
SUT PDS II 3-0 CT2 27 ABS (SUTURE) IMPLANT
SUT SILK 3 0 PS 1 (SUTURE) IMPLANT
SUT VIC AB 3-0 SH 27 (SUTURE)
SUT VIC AB 3-0 SH 27X BRD (SUTURE) IMPLANT
SUT VICRYL 4-0 PS2 18IN ABS (SUTURE) IMPLANT
SYR 10ML LL (SYRINGE) ×8 IMPLANT
SYR 3ML 18GX1 1/2 (SYRINGE) IMPLANT
SYR 50ML LL SCALE MARK (SYRINGE) ×4 IMPLANT
SYR BULB IRRIGATION 50ML (SYRINGE) ×2 IMPLANT
SYR CONTROL 10ML LL (SYRINGE) ×2 IMPLANT
SYR TOOMEY 50ML (SYRINGE) ×4 IMPLANT
TOWEL OR 17X24 6PK STRL BLUE (TOWEL DISPOSABLE) ×4 IMPLANT
TUBE CONNECTING 20X1/4 (TUBING) ×2 IMPLANT
TUBING INFILTRATION IT-10001 (TUBING) IMPLANT
TUBING SET GRADUATE ASPIR 12FT (MISCELLANEOUS) ×2 IMPLANT
UNDERPAD 30X30 (UNDERPADS AND DIAPERS) ×4 IMPLANT
YANKAUER SUCT BULB TIP NO VENT (SUCTIONS) ×2 IMPLANT

## 2017-04-09 NOTE — Transfer of Care (Signed)
Immediate Anesthesia Transfer of Care Note  Patient: [name]  Procedure(s) Performed: Procedure(s): REVISION BREAST RECONSTRUCTION (Left) LIPOFILLING TO THE LEFT UPPER BREAST TO IMPROVE SYMMETRY. (Left)  Patient Location: PACU  Anesthesia Type:General  Level of Consciousness: sedated  Airway & Oxygen Therapy: Patient Spontanous Breathing and Patient connected to face mask oxygen  Post-op Assessment: Report given to RN and Post -op Vital signs reviewed and stable  Post vital signs: Reviewed and stable  Last Vitals:  Vitals:   04/09/17 0633  BP: 118/69  Pulse: 70  Resp: 18  Temp: 36.8 C  SpO2: 100%    Last Pain:  Vitals:   04/09/17 0633  TempSrc: Oral  PainSc: 3       Patients Stated Pain Goal: 2 (51/83/43 [id number])  Complications: No apparent anesthesia complications

## 2017-04-09 NOTE — Op Note (Signed)
Op report   DATE OF OPERATION:  04/09/2017  LOCATION: [place]  SURGICAL DIVISION: Plastic Surgery  PREOPERATIVE DIAGNOSES:  1. Left Breast Asymmetry.  2. History of left breast cancer.  3. Acquired absence of bilateral breasts.   POSTOPERATIVE DIAGNOSES:  1. Left Breast Asymmetry.  2. History of left breast cancer.  3. Acquired absence of bilateral breasts.   PROCEDURE:  1. Lipofilling of left breast for symmetry. 2. Scar release of lateral left breast  SURGEON: Lyndee Leo Sanger Shaterrica Territo, DO  ASSISTANT: Shawn Rayburn, PA  ANESTHESIA:  General.   COMPLICATIONS: None.   INDICATIONS FOR PROCEDURE:  The patient, [name], is a 51 y.o. female born on 04-30-1966, is here for further treatment after a mastectomy and placement of breast implants. She now presents for improved symmetry. MRN: [id number]  CONSENT:  Informed consent was obtained directly from the patient. Risks, benefits and alternatives were fully discussed. Specific risks including but not limited to bleeding, infection, hematoma, seroma, scarring, pain, implant infection, implant extrusion, capsular contracture, asymmetry, wound healing problems, and need for further surgery were all discussed. The patient did have an ample opportunity to have her questions answered to her satisfaction.   DESCRIPTION OF PROCEDURE:  The patient was taken to the operating room. SCDs were placed and IV antibiotics were given. The patient's chest was prepped and draped in a sterile fashion. A time out was performed and the implants to be used were identified.  One percent Xylocaine with epinephrine was used to infiltrate the umbilical area.   The #15 blade was used to make a 10mm incision at the inferior umbilical area.  The tumescent was infused into the fat layer.  After waiting for the local to take effect the liposuction was performed to harvest the adipose.  The Revolve was used to prepare the fat.  The #15  blade was used to make a 5 mm incision in the breast at the site of the previous scar.  The cannula was placed with the forked tip to release the scar contracture at the left lateral area.  The adipose (165 cc) was then injected into the space superiorly and lateral superior for improved symmetry.  The skin was closed with 5-0 Monocryl.  Dermabond was applied.  A breast binder, abdominal binder and ABDs were applied.  The patient was allowed to wake from anesthesia and taken to the recovery room in satisfactory condition.

## 2017-04-09 NOTE — Discharge Instructions (Signed)
Post Anesthesia Home Care Instructions  Activity: Get plenty of rest for the remainder of the day. A responsible individual must stay with you for 24 hours following the procedure.  For the next 24 hours, DO NOT: -Drive a car -Paediatric nurse -Drink alcoholic beverages -Take any medication unless instructed by your physician -Make any legal decisions or sign important papers.  Meals: Start with liquid foods such as gelatin or soup. Progress to regular foods as tolerated. Avoid greasy, spicy, heavy foods. If nausea and/or vomiting occur, drink only clear liquids until the nausea and/or vomiting subsides. Call your physician if vomiting continues.  Special Instructions/Symptoms: Your throat may feel dry or sore from the anesthesia or the breathing tube placed in your throat during surgery. If this causes discomfort, gargle with warm salt water. The discomfort should disappear within 24 hours.  If you had a scopolamine patch placed behind your ear for the management of post- operative nausea and/or vomiting:  1. The medication in the patch is effective for 72 hours, after which it should be removed.  Wrap patch in a tissue and discard in the trash. Wash hands thoroughly with soap and water. 2. You may remove the patch earlier than 72 hours if you experience unpleasant side effects which may include dry mouth, dizziness or visual disturbances. 3. Avoid touching the patch. Wash your hands with soap and water after contact with the patch.   No heavy lifting May shower tomorrow Continue binder or sports bra  Breast Cancer Survivor Follow-up The goal of treatment for breast cancer is to get rid of all cancer cells in the body, but sometimes a few cells remain. These cells can grow and cause the cancer to return (recur) later. If this happens, the goal is to find the cancer as soon as possible. Cancer can recur just a few months after treatment or years later. Most cases of recurrent breast  cancer develop within 5 years after treatment. Will my cancer return? There is no way to know if your breast cancer will return. However, your chance of developing recurrent breast cancer is greater if you had:  Breast cancer before 50 years of age.  Breast cancer that spread to the lymph nodes.  A tumor that was bigger than 2 inches (5 cm).  A high-grade tumor. These are tumors that have cells that grow more quickly than other types of tumors.  A close tumor margin. This means that the space between the tumor and normal, noncancerous cells was small.  Inflammatory breast cancer. This is an aggressive form of cancer in which cancer cells block the lymph vessels in the skin of the breast.  Human epidermal growth factor (HER2) positive breast cancer. This is a type of cancer that grows as a result of the HER2 protein.  Surgery to remove the tumor but not the entire breast (lumpectomy) without radiation therapy.  What are the symptoms of recurrent breast cancer? Examine your breasts every month. You may find it helpful to do this on the same day each month. Mark your calendar as a reminder. Let your health care provider know immediately if you have any signs or symptoms of recurrent breast cancer. Signs and symptoms of recurrent breast cancer vary. Symptoms will depend on where the cancer is and how the original cancer was treated. Recurrence in the same spot or the opposite breast Symptoms of a cancer that comes back in the same spot (local recurrence) after a lumpectomy, or a recurrence in the opposite  breast, may include:  A new lump or thickening in the breast.  A change in the way that the skin of the breast looks, such as a rash, dimpling, or wrinkling.  Redness or swelling of the breast.  Changes in the nipple. It may be leaking fluid, or it may be red, puckered, or swollen.  Recurrence after a mastectomy Symptoms of a recurrence after breast removal surgery (mastectomy) may  include:  A lump or thickening under the skin.  A thickening around the mastectomy scar.  Recurrence in the lymph nodes Symptoms of a cancer that comes back in the lymph nodes near the breast (regional recurrence) may include:  A lump under the arm or above the collarbone.  Swelling of the arm.  Pain in the arm, shoulder, or chest.  Numbness in the hand or arm.  Recurrence in a different part of the body Symptoms of cancer that comes back in an area of the body far away from the original cancer site (distant recurrence) may include:  A cough that does not go away.  Trouble breathing or shortness of breath.  Pain in the bones, the spine, or the chest. This is pain that lasts or does not improve with rest and medicine.  Headaches.  Sudden vision problems.  Dizziness.  Nausea or vomiting.  Weight loss.  Abdominal pain that does not go away.  Yellowing of the skin or eyes (jaundice).  Blood in the urine or bloody vaginal discharge.  Following up with your health care provider  Most people continue to see their cancer specialist (oncologist) every 3-6 months for the first year after cancer treatment. Ask about your cancer survivor plan of care that outlines your follow-up care after treatment ends. See your primary care provider for regular checkups during this time.  Ask your oncologist: ? How often you should have follow-up visits. ? What symptoms to watch for. ? What to do and whom to call about any symptoms. ? What tests should be done.  Keep a schedule of appointments for the tests and exams that you need, including physical exams, breast exams, and exams of the lymph nodes.  For the first 3 years after being treated for breast cancer, see your health care provider every 3-6 months.  In the fourth and fifth years after being treated for breast cancer, see your health care provider every 6-12 months.  Starting at 5 years after your breast cancer treatment, see  your health care provider at least once a year.  Continue to have regular breast X-rays (mammograms), even if you had a mastectomy. ? Get a mammogram 1 year after the mammogram that first detected breast cancer. ? Get a mammogram every 6-12 months after that or as often as your health care provider suggests.  Have a pelvic exam every year or as often as your health care provider suggests.  Have other cancer screening tests as recommended, such as skin cancer screening and colorectal cancer screening.  Some tests are not recommended for routine screening. Most people recovering from breast cancer do not need to have these tests if there are no problems. The tests have risks, such as radiation exposure, and can be costly. The risks of some of the following tests are thought to be greater than the benefits: ? Blood tests. ? Chest X-rays. ? Bone scans. ? Liver ultrasound. ? CT scan. ? MRI. ? Positron emission tomography (PET scan).  Where to find more information:  American Cancer Society: www.cancer.org  National  Cancer Institute: www.cancer.gov Contact a health care provider if:  You have any signs or symptoms of recurrent breast cancer.  You are taking a medicine prescribed to treat your breast cancer and you have vaginal bleeding.  You discover new lumps or changes in your breast.  You have headaches or have pain in your bones, spine, chest, or abdomen.  You have shortness of breath.  You have a cough that does not go away.  You have discharge from your nipple.  You have a rash on your breast. Get help right away if:  You have trouble breathing.  You have chest pain. Summary  Most cases of recurrent breast cancer develop within 5 years after treatment.  Examine your breasts every month. Let your health care provider know immediately if you have any signs or symptoms of recurrent breast cancer.  Keep a schedule of appointments for the tests and exams that you need,  including physical exams, breast exams, and exams of the lymph nodes.  Continue to have regular breast X-rays (mammograms), even if you had a mastectomy. This information is not intended to replace advice given to you by your health care provider. Make sure you discuss any questions you have with your health care provider. Document Released: 03/12/2011 Document Revised: 07/03/2016 Document Reviewed: 07/03/2016 Elsevier Interactive Patient Education  2017 [employer].

## 2017-04-09 NOTE — Progress Notes (Signed)
Pt complains of anxiety, denies taking anything at home for anxiety, repeated complaints of panicky feeling, Dr. Tobias Alexander made aware, 1mg  versed given, will continue to monitor

## 2017-04-09 NOTE — Anesthesia Postprocedure Evaluation (Signed)
Anesthesia Post Note  Patient: [name]  Procedure(s) Performed: Procedure(s) (LRB): REVISION BREAST RECONSTRUCTION (Left) LIPOFILLING TO THE LEFT UPPER BREAST TO IMPROVE SYMMETRY. (Left)     Patient location during evaluation: PACU Anesthesia Type: General Level of consciousness: sedated Pain management: pain level controlled Vital Signs Assessment: post-procedure vital signs reviewed and stable Respiratory status: spontaneous breathing and respiratory function stable Cardiovascular status: stable Anesthetic complications: no    Last Vitals:  Vitals:   04/09/17 0930 04/09/17 1036  BP: 114/74 110/69  Pulse: 99 92  Resp: 14   Temp:  36.4 C  SpO2: 97% 100%    Last Pain:  Vitals:   04/09/17 1036  TempSrc: Oral  PainSc: 3                  Levaeh Vice DANIEL

## 2017-04-09 NOTE — Anesthesia Procedure Notes (Signed)
Procedure Name: LMA Insertion Date/Time: 04/09/2017 7:44 AM Performed by: Melynda Ripple D Pre-anesthesia Checklist: Patient identified, Emergency Drugs available, Suction available and Patient being monitored Patient Re-evaluated:Patient Re-evaluated prior to induction Oxygen Delivery Method: Circle system utilized Preoxygenation: Pre-oxygenation with 100% oxygen Induction Type: IV induction Ventilation: Mask ventilation without difficulty LMA: LMA inserted LMA Size: 4.0 Number of attempts: 1 Airway Equipment and Method: Bite block Placement Confirmation: positive ETCO2 Tube secured with: Tape Dental Injury: Teeth and Oropharynx as per pre-operative assessment

## 2017-04-09 NOTE — H&P (Signed)
[name] is an 51 y.o. female.   Chief Complaint: breast asymmetry HPI: The patient is a 51 yrs old wf here with her husband for pre operative history and physical prior to bilateral breast reconstruction revision with planned liposuction with lipo filling.  She had silicone implants placed 01/01/17.   There is still loss of volume of bilateral upper breasts, especially on the left upper breast area and she would like lipo filling to improve her shape and symmetry.   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 She had a mammogram in November which was negative. She had an MRI in January which showed 2 masses in the LEFT breast 5.2 x 2.4 x 1 cm in the lower outer quadrant and 1.6 x 1.4 x 0.9 cm in the anterior inferior portion. Biopsies on 1/8 showed high-grade ductal carcinoma in situ. She had left breast lobular carcinoma in situ and left lumpectomy 2014 and has been on Aromasin. She did not have radiation.  Past Medical History:  Diagnosis Date  . Abrasion of right arm 12/25/2016  . Complication of anesthesia    panic attack while waking up  . History of breast cancer 2014  . Hypothyroidism   . Migraines   . PONV (postoperative nausea and vomiting)     Past Surgical History:  Procedure Laterality Date  . BREAST LUMPECTOMY WITH NEEDLE LOCALIZATION Left 06/20/2013   Procedure: BREAST LUMPECTOMY WITH NEEDLE LOCALIZATION;  Surgeon: Merrie Roof, MD;  Location: [place];  Service: General;  Laterality: Left;  . LAPAROSCOPIC VAGINAL HYSTERECTOMY WITH SALPINGO OOPHORECTOMY Bilateral 06/17/2016   Procedure: LAPAROSCOPIC ASSISTED VAGINAL HYSTERECTOMY WITH SALPINGO OOPHORECTOMY;  Surgeon: Arvella Nigh, MD;  Location: [place];  Service: Gynecology;  Laterality: Bilateral;  need bed  . MASTECTOMY W/ SENTINEL NODE BIOPSY Bilateral 09/08/2016   Procedure: LEFT MASTECTOMY WITH LEFT SENTINEL LYMPH NODE BIOPSY,  RIGHT PROPHYLACTIC MASTECTOMY;  Surgeon: Autumn Messing III, MD;  Location: [place];  Service: General;  Laterality: Bilateral;  . PALATE TO GINGIVA GRAFT    . REMOVAL OF BILATERAL TISSUE EXPANDERS WITH PLACEMENT OF BILATERAL BREAST IMPLANTS Bilateral 01/01/2017   Procedure: REMOVAL OF BILATERAL TISSUE EXPANDERS WITH PLACEMENT OF BILATERAL SILICONE BREAST IMPLANTS WITH LIPOSUCTION;  Surgeon: Wallace Going, DO;  Location: [place];  Service: Plastics;  Laterality: Bilateral;  . TISSUE EXPANDER PLACEMENT Bilateral 09/08/2016   Procedure: IMMEDIATE PLACEMENT OF BILATERAL TISSUE EXPANDERS AFTER MASTECTOMIES;  Surgeon: Loel Lofty Dillingham, DO;  Location: [place];  Service: Plastics;  Laterality: Bilateral;    Family History  Problem Relation Age of Onset  . Breast cancer Mother 36  . Heart disease Mother        Atrial fibrillation  . Melanoma Father 89  . Breast cancer Sister 42       ER+/her2-; onco score = 8  . Cancer Maternal Aunt        oral cancer; smoker  . Head & neck cancer Paternal Uncle        smoker  . Breast cancer Cousin        dx in her late 22s-60s   Social History:  reports that she has never smoked. She has never used smokeless tobacco. She reports that she does not drink alcohol or use drugs.  Allergies:  Allergies  Allergen Reactions  . Penicillins Rash    Medications Prior to Admission  Medication Sig Dispense Refill  . Ascorbic Acid (  VITAMIN C) 1000 MG tablet Take 1,000 mg by mouth daily.    Marland Kitchen aspirin 81 MG chewable tablet Chew by mouth daily.    . Calcium Carbonate-Vitamin D (CALCIUM + D PO) Take 2 tablets by mouth daily.     . cholecalciferol (VITAMIN D) 1000 units tablet Take 1,000 Units by mouth daily.     Marland Kitchen KRILL OIL OMEGA-3 PO Take 1 tablet by mouth daily.     Marland Kitchen levothyroxine (SYNTHROID, LEVOTHROID) 125 MCG tablet Take 125 mcg by mouth daily before breakfast.    . magnesium oxide (MAG-OX) 400 MG tablet Take 400 mg by mouth daily.    .  Melatonin 5 MG TABS Take by mouth.    . Multiple Vitamin (MULTIVITAMIN) tablet Take 1 tablet by mouth daily.    . Riboflavin 400 MG TABS Take by mouth.    . rizatriptan (MAXALT) 10 MG tablet Take 10 mg by mouth as needed for migraine. May repeat in 2 hours if needed    . TURMERIC PO Take by mouth.    . vitamin E (VITAMIN E) 1000 UNIT capsule Take 1,000 Units by mouth daily.      No results found for this or any previous visit (from the past 48 hour(s)). No results found.  Review of Systems  Constitutional: Negative.   HENT: Negative.   Eyes: Negative.   Respiratory: Negative.   Cardiovascular: Negative.   Gastrointestinal: Negative.   Genitourinary: Negative.   Musculoskeletal: Negative.   Skin: Negative.   Neurological: Negative.   Psychiatric/Behavioral: Negative.     Blood pressure 118/69, pulse 70, temperature 98.2 F (36.8 C), temperature source Oral, resp. rate 18, height 5' 7.5" (1.715 m), weight 88.5 kg (195 lb), last menstrual period 02/01/2014, SpO2 100 %. Physical Exam  Constitutional: She is oriented to person, place, and time. She appears well-developed and well-nourished.  HENT:  Head: Normocephalic and atraumatic.  Eyes: Pupils are equal, round, and reactive to light. Conjunctivae and EOM are normal.  Cardiovascular: Normal rate.   Respiratory: Effort normal.  GI: Soft.  Neurological: She is alert and oriented to person, place, and time.  Skin: Skin is warm. No rash noted. No erythema.  Psychiatric: She has a normal mood and affect. Her behavior is normal. Judgment and thought content normal.     Assessment/Plan Release of scar on the left lateral breast. Revision of breast reconstruction with lipo-filling.  The risks that can be encountered with and after lipo filling of the breasts were discussed and include the following but no limited to these:  Asymmetry, fluid accumulation, firmness of the area, fat necrosis with death of fat tissue, bleeding,  infection, delayed healing, anesthesia risks, skin sensation changes, injury to structures including nerves, blood vessels, and muscles which may be temporary or permanent, allergies to tape, suture materials and glues, blood products, topical preparations or injected agents, skin and contour irregularities, skin discoloration and swelling, deep vein thrombosis, cardiac and pulmonary complications, pain, which may persist, persistent pain, recurrence of the lesion, poor healing of the incision, possible need for revisional surgery or staged procedures. There can also be persistent swelling, poor wound healing, rippling or loose skin, worsening of cellulite, swelling, and thermal burn or heat injury from ultrasound with the ultrasound-assisted lipoplasty technique. Any change in weight fluctuations can alter the outcome. The patients and husbands questions were answered and the patient desires to proceed and consent was obtained.   Wallace Going, DO 04/09/2017, 7:09 AM

## 2017-04-10 ENCOUNTER — Encounter (HOSPITAL_BASED_OUTPATIENT_CLINIC_OR_DEPARTMENT_OTHER): Payer: Self-pay | Admitting: Plastic Surgery

## 2017-04-15 ENCOUNTER — Encounter (HOSPITAL_BASED_OUTPATIENT_CLINIC_OR_DEPARTMENT_OTHER): Payer: Self-pay | Admitting: Plastic Surgery

## 2017-07-07 ENCOUNTER — Other Ambulatory Visit: Payer: Self-pay | Admitting: General Surgery

## 2017-07-09 ENCOUNTER — Other Ambulatory Visit: Payer: Self-pay | Admitting: General Surgery

## 2017-07-09 DIAGNOSIS — M79652 Pain in left thigh: Secondary | ICD-10-CM

## 2017-07-19 ENCOUNTER — Ambulatory Visit
Admission: RE | Admit: 2017-07-19 | Discharge: 2017-07-19 | Disposition: A | Discharge status: Home / Self Care | Payer: BC Managed Care – PPO | Source: Ambulatory Visit | Attending: General Surgery | Admitting: General Surgery

## 2017-07-19 DIAGNOSIS — M79652 Pain in left thigh: Secondary | ICD-10-CM

## 2017-07-19 MED ORDER — GADOBENATE DIMEGLUMINE 529 MG/ML IV SOLN
18.0000 mL | Freq: Once | INTRAVENOUS | Status: AC | PRN
  Administered 2017-07-19: 18 mL via INTRAVENOUS

## 2017-08-03 ENCOUNTER — Other Ambulatory Visit: Payer: Self-pay

## 2017-08-03 ENCOUNTER — Inpatient Hospital Stay: Payer: BC Managed Care – PPO | Attending: Hematology & Oncology

## 2017-08-03 ENCOUNTER — Inpatient Hospital Stay (HOSPITAL_BASED_OUTPATIENT_CLINIC_OR_DEPARTMENT_OTHER): Payer: BC Managed Care – PPO | Admitting: Hematology & Oncology

## 2017-08-03 ENCOUNTER — Encounter: Payer: Self-pay | Admitting: Hematology & Oncology

## 2017-08-03 VITALS — BP 111/67 | HR 72 | Temp 98.2°F | Resp 18 | Wt 200.0 lb

## 2017-08-03 DIAGNOSIS — M818 Other osteoporosis without current pathological fracture: Secondary | ICD-10-CM

## 2017-08-03 DIAGNOSIS — Z79899 Other long term (current) drug therapy: Secondary | ICD-10-CM | POA: Diagnosis not present

## 2017-08-03 DIAGNOSIS — D0512 Intraductal carcinoma in situ of left breast: Secondary | ICD-10-CM | POA: Insufficient documentation

## 2017-08-03 DIAGNOSIS — Z7982 Long term (current) use of aspirin: Secondary | ICD-10-CM | POA: Insufficient documentation

## 2017-08-03 DIAGNOSIS — T386X5A Adverse effect of antigonadotrophins, antiestrogens, antiandrogens, not elsewhere classified, initial encounter: Secondary | ICD-10-CM

## 2017-08-03 DIAGNOSIS — Z9013 Acquired absence of bilateral breasts and nipples: Secondary | ICD-10-CM

## 2017-08-03 DIAGNOSIS — N951 Menopausal and female climacteric states: Secondary | ICD-10-CM

## 2017-08-03 LAB — CBC WITH DIFFERENTIAL (CANCER CENTER ONLY)
BASOS ABS: 0 10*3/uL (ref 0.0–0.1)
Basophils Relative: 1 %
EOS PCT: 2 %
Eosinophils Absolute: 0.1 10*3/uL (ref 0.0–0.5)
HEMATOCRIT: 39.7 % (ref 36.0–46.0)
HEMOGLOBIN: 13.5 g/dL (ref 11.6–15.9)
LYMPHS ABS: 2 10*3/uL (ref 0.9–3.3)
Lymphocytes Relative: 49 %
MCH: 29.9 pg (ref 26.0–34.0)
MCHC: 34 g/dL (ref 30.0–36.0)
MCV: 87.8 fL (ref 78.0–100.0)
Monocytes Absolute: 0.4 10*3/uL (ref 0.1–0.9)
Monocytes Relative: 11 %
NEUTROS ABS: 1.5 10*3/uL (ref 1.5–6.5)
NEUTROS PCT: 37 %
PLATELETS: 288 10*3/uL (ref 145–400)
RBC: 4.52 MIL/uL (ref 3.70–5.45)
RDW: 12.4 % (ref 11.5–15.5)
WBC: 4 10*3/uL (ref 4.0–10.3)

## 2017-08-03 LAB — CMP (CANCER CENTER ONLY)
ALK PHOS: 87 U/L (ref 40–150)
ALT: 20 U/L (ref 0–55)
AST: 17 U/L (ref 5–34)
Albumin: 4.3 g/dL (ref 3.5–5.0)
Anion gap: 8 (ref 3–11)
BILIRUBIN TOTAL: 0.4 mg/dL (ref 0.2–1.2)
BUN: 9 mg/dL (ref 7–26)
CALCIUM: 9.5 mg/dL (ref 8.4–10.4)
CHLORIDE: 103 mmol/L (ref 98–109)
CO2: 25 mmol/L (ref 22–29)
CREATININE: 0.79 mg/dL (ref 0.70–1.30)
Glucose, Bld: 87 mg/dL (ref 70–140)
Potassium: 4.2 mmol/L (ref 3.3–4.7)
Sodium: 136 mmol/L (ref 136–145)
Total Protein: 7.4 g/dL (ref 6.4–8.3)

## 2017-08-03 MED ORDER — MEGESTROL ACETATE 20 MG PO TABS: 20.0000 mg | ORAL_TABLET | Freq: Every day | ORAL | 6 refills | Status: DC

## 2017-08-03 NOTE — Progress Notes (Signed)
Hematology and Oncology Follow Up Visit  [name] [id number] Feb 10, 1966 52 y.o. 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 She did have her bilateral mastectomy. This is done on February 12. The pathology report ([id number]) shows high-grade ductal carcinoma in situ. She had 2 foci measuring 1.1 and 0.7 cm. All margins were negative. An anterior margin was focally less than 0.1 cm. Surprisingly, the right breast showed focal ductal hyperplasia. A sentinel lymph node was taken in the left ankle which was negative.  She is doing quite well.  She had a new grandmother.  A grandson was born in September.  She had no problems with the holidays.  She had no problems with the snowstorm.  She has had no issues with nausea or vomiting.  She has had no fatigue.  She has had no rashes..  She has had some stiffness in her shoulders.  I asked her if she wanted to have any physical therapy for this.  She said that she would do some stretching exercises on her own.  She does have some hot flashes.  I called in some Megace (20 mg p.o. daily) to try to help with these hot flashes.  She has had no fever.  She has had no bleeding.  There is been no change in bowel or bladder habits.  Overall, her performance status is ECOG 1.    Medications:  Current Outpatient Medications:  .  Ascorbic Acid (VITAMIN C) 1000 MG tablet, Take 1,000 mg by mouth daily., Disp: , Rfl:  .  aspirin 81 MG chewable tablet, Chew by mouth daily., Disp: , Rfl:  .  Calcium Carbonate-Vitamin D (CALCIUM + D PO), Take 2 tablets by mouth daily. , Disp: , Rfl:  .  cholecalciferol (VITAMIN D) 1000 units tablet, Take 1,000 Units by mouth daily. , Disp: , Rfl:  .  KRILL OIL OMEGA-3 PO, Take 1 tablet by mouth daily. , Disp: , Rfl:  .  levothyroxine (SYNTHROID, LEVOTHROID) 125 MCG tablet,  Take 125 mcg by mouth daily before breakfast., Disp: , Rfl:  .  magnesium oxide (MAG-OX) 400 MG tablet, Take 400 mg by mouth daily., Disp: , Rfl:  .  Melatonin 5 MG TABS, Take by mouth., Disp: , Rfl:  .  Multiple Vitamin (MULTIVITAMIN) tablet, Take 1 tablet by mouth daily., Disp: , Rfl:  .  Riboflavin 400 MG TABS, Take by mouth., Disp: , Rfl:  .  rizatriptan (MAXALT) 10 MG tablet, Take 10 mg by mouth as needed for migraine. May repeat in 2 hours if needed, Disp: , Rfl:  .  TURMERIC PO, Take by mouth., Disp: , Rfl:  .  vitamin E (VITAMIN E) 1000 UNIT capsule, Take 1,000 Units by mouth daily., Disp: , Rfl:   Allergies:  Allergies  Allergen Reactions  . Penicillins Rash    Past Medical History, Surgical history, Social history, and Family History were reviewed and updated.  Review of Systems: Review of Systems  All other systems reviewed and are negative.    Physical Exam:  vitals were not taken for this visit.   Wt Readings from Last 3 Encounters:  04/09/17 195 lb (88.5 kg)  01/29/17 192 lb (87.1 kg)  01/01/17 190 lb (86.2 kg)     Physical Exam  Constitutional: She is oriented to person, place, and time.  Breast exam shows bilateral mastectomies.  She has implants.  Everything looks well-healed.  She has no adenopathy in the axilla bilaterally.  HENT:  Head: Normocephalic and atraumatic.  Mouth/Throat: Oropharynx is clear and moist.  Eyes: EOM are normal. Pupils are equal, round, and reactive to light.  Neck: Normal range of motion.  Cardiovascular: Normal rate, regular rhythm and normal heart sounds.  Pulmonary/Chest: Effort normal and breath sounds normal.  Abdominal: Soft. Bowel sounds are normal.  Musculoskeletal: Normal range of motion. She exhibits no edema, tenderness or deformity.  Lymphadenopathy:    She has no cervical adenopathy.  Neurological: She is alert and oriented to person, place, and time.  Skin: Skin is warm and dry. No rash noted. No erythema.    Psychiatric: She has a normal mood and affect. Her behavior is normal. Judgment and thought content normal.  Vitals reviewed.   Lab Results  Component Value Date   WBC 4.1 01/29/2017   HGB 12.7 01/29/2017   HCT 38.7 01/29/2017   MCV 87 01/29/2017   PLT 270 01/29/2017     Chemistry      Component Value Date/Time   NA 139 01/29/2017 0747   NA 138 05/30/2016 0746   K 3.8 01/29/2017 0747   K 3.8 05/30/2016 0746   CL 105 01/29/2017 0747   CO2 30 01/29/2017 0747   CO2 25 05/30/2016 0746   BUN 7 01/29/2017 0747   BUN 10.0 05/30/2016 0746   CREATININE 0.8 01/29/2017 0747   CREATININE 0.7 05/30/2016 0746      Component Value Date/Time   CALCIUM 9.3 01/29/2017 0747   CALCIUM 9.2 05/30/2016 0746   ALKPHOS 70 01/29/2017 0747   ALKPHOS 71 05/30/2016 0746   AST 19 01/29/2017 0747   AST 16 05/30/2016 0746   ALT 18 01/29/2017 0747   ALT 11 05/30/2016 0746   BILITOT 0.60 01/29/2017 0747   BILITOT 0.83 05/30/2016 0746         Impression and Plan: [name] is a 30 year old white female. She had ductal carcinoma in situ of the left breast. This was multifocal. She underwent bilateral mastectomies.  Everything looks fantastic.  I do not see any problems with respect to her having had the surgery.  Again, hopefully the Megace will help with her hot flashes.  She is taking vitamin D.  We will see her back in 6 months.  [name], MD 1/7/20198:05 AM

## 2017-08-06 LAB — VITAMIN D 25 HYDROXY (VIT D DEFICIENCY, FRACTURES): VIT D 25 HYDROXY: 41 ng/mL (ref 30.0–100.0)

## 2017-12-28 ENCOUNTER — Telehealth: Payer: Self-pay | Admitting: *Deleted

## 2017-12-28 NOTE — Telephone Encounter (Signed)
OK to schedule an acute visit with me.

## 2017-12-28 NOTE — Telephone Encounter (Signed)
Spoke with pt and scheduled OV for Wednesday at 9:40am.

## 2017-12-28 NOTE — Telephone Encounter (Signed)
Copied from [place] [id number]. Topic: Appointment Scheduling - Prior Auth Required for Appointment >> Dec 28, 2017  9:35 AM Ahmed Prima L wrote: Patient called and stated she has had a chronic cough for 6-8 weeks and does not have an establish appointment with [facility] until July. She wants to know can she go ahead and be seen? Please advise [phone]. Patient already went to urgent care and they said there is nothing they can do for her as this is chronic and that she needs to see a primary

## 2017-12-30 ENCOUNTER — Ambulatory Visit (INDEPENDENT_AMBULATORY_CARE_PROVIDER_SITE_OTHER): Payer: BC Managed Care – PPO | Admitting: Family

## 2017-12-30 ENCOUNTER — Encounter: Payer: Self-pay | Admitting: Family

## 2017-12-30 VITALS — BP 111/69 | HR 85 | Temp 98.8°F | Resp 18 | Ht 67.25 in | Wt 217.0 lb

## 2017-12-30 DIAGNOSIS — R05 Cough: Secondary | ICD-10-CM

## 2017-12-30 DIAGNOSIS — K219 Gastro-esophageal reflux disease without esophagitis: Secondary | ICD-10-CM | POA: Diagnosis not present

## 2017-12-30 DIAGNOSIS — R635 Abnormal weight gain: Secondary | ICD-10-CM

## 2017-12-30 DIAGNOSIS — F329 Major depressive disorder, single episode, unspecified: Secondary | ICD-10-CM | POA: Diagnosis not present

## 2017-12-30 DIAGNOSIS — R232 Flushing: Secondary | ICD-10-CM

## 2017-12-30 DIAGNOSIS — E039 Hypothyroidism, unspecified: Secondary | ICD-10-CM | POA: Diagnosis not present

## 2017-12-30 DIAGNOSIS — R059 Cough, unspecified: Secondary | ICD-10-CM

## 2017-12-30 DIAGNOSIS — Z853 Personal history of malignant neoplasm of breast: Secondary | ICD-10-CM

## 2017-12-30 DIAGNOSIS — G43909 Migraine, unspecified, not intractable, without status migrainosus: Secondary | ICD-10-CM | POA: Diagnosis not present

## 2017-12-30 DIAGNOSIS — F32A Depression, unspecified: Secondary | ICD-10-CM

## 2017-12-30 MED ORDER — VENLAFAXINE HCL 37.5 MG PO TABS: ORAL_TABLET | ORAL | 0 refills | Status: DC

## 2017-12-30 MED ORDER — CETIRIZINE HCL 10 MG PO TABS: 10.0000 mg | ORAL_TABLET | Freq: Every day | ORAL | 11 refills | Status: DC

## 2017-12-30 MED ORDER — PANTOPRAZOLE SODIUM 40 MG PO TBEC: 40.0000 mg | DELAYED_RELEASE_TABLET | Freq: Two times a day (BID) | ORAL | 1 refills | Status: DC

## 2017-12-30 NOTE — Progress Notes (Signed)
Subjective:    Patient ID: [name], female    DOB: June 30, 1966, 52 y.o.   MRN: [id number]  HPI  [name] is a 52 yr old female who presents today to establish care.   She presents today with chief c/o cough.  Cough has been present x 8 weeks.  Has associated sore throat, denies fever or post nasal drip.   She reports that she was told by her dentist that she is taking otc omeprazole 57m for the last 3-4.   Reports that she feeling like she is going to throw up.  She reports wheezing only during coughing spasm.    Migraines- has had for years. She is now followed by neurology. Feels like stress, dehydration, worsen her migraines.  Has followed by [facility]. Would like to re-establish with them.   Breast Cancer- lumpectomy 2014 for lobular carcinoma in situ.  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 Had bilateral mastectomy with implants 2018.  Not brca positive.  Had abnormal uterine bleeding , had hysterectomy 2017.  Hypothyroid- on synthroid, she sees Dr. MRadene Knee(OB/GYN) who has been managing this.    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  He is not doing well.  Feels like her mood is low.  Feels depression.  Has a lot of situational stressors. Denies SI.    Notes that she was having hot flashes and oncology placed her on megace. Has had increased hunger/weight gain since starting megace in January of this year.   Wt Readings from Last 3 Encounters:  12/30/17 217 lb (98.4 kg)  08/03/17 200 lb (90.7 kg)  04/09/17 195 lb (88.5 kg)     Review of Systems  Constitutional:       Has gained 20 pounds in the last 6 months. Attributes this to stress, late night eating  HENT: Negative for rhinorrhea.   Respiratory: Positive for cough.   Cardiovascular: Negative for leg swelling.  Gastrointestinal: Positive for diarrhea. Negative for constipation.       Chronic diarrhea told IBS  Genitourinary: Negative for dysuria and frequency.  Musculoskeletal: Negative for arthralgias and myalgias.    Neurological: Positive for headaches.  Hematological: Negative for adenopathy.  Psychiatric/Behavioral:       See HPI   Past Medical History:  Diagnosis Date  . Abrasion of right arm 12/25/2016  . Complication of anesthesia    panic attack while waking up  . History of breast cancer 2014  . Hypothyroidism   . Migraines   . PONV (postoperative nausea and vomiting)      Social History   Socioeconomic History  . Marital status: Married    Spouse name: JMerry Proud . Number of children: 0  . Years of education: Masters  . Highest education level: Not on file  Occupational History  . Occupation: Medical Records  Social Needs  . Financial resource strain: Not on file  . Food insecurity:    Worry: Not on file    Inability: Not on file  . Transportation needs:    Medical: Not on file    Non-medical: Not on file  Tobacco Use  . Smoking status: Never Smoker  . Smokeless tobacco: Never Used  Substance and Sexual Activity  . Alcohol use: No    Alcohol/week: 0.0 oz  . Drug use: No  . Sexual activity: Yes  Lifestyle  . Physical activity:    Days per week: Not on file    Minutes per session: Not on file  . Stress: Not on  file  Relationships  . Social connections:    Talks on phone: Not on file    Gets together: Not on file    Attends religious service: Not on file    Active member of club or organization: Not on file    Attends meetings of clubs or organizations: Not on file    Relationship status: Not on file  . Intimate partner violence:    Fear of current or ex partner: Not on file    Emotionally abused: Not on file    Physically abused: Not on file    Forced sexual activity: Not on file  Other Topics Concern  . Not on file  Social History Narrative   Lives at home with husband.   Right-handed.   2-3 cups caffeine daily.    Past Surgical History:  Procedure Laterality Date  . BREAST LUMPECTOMY WITH NEEDLE LOCALIZATION Left 06/20/2013   Procedure: BREAST  LUMPECTOMY WITH NEEDLE LOCALIZATION;  Surgeon: Merrie Roof, MD;  Location: [place];  Service: General;  Laterality: Left;  . BREAST RECONSTRUCTION Left 04/09/2017   Procedure: REVISION BREAST RECONSTRUCTION;  Surgeon: Wallace Going, DO;  Location: [place];  Service: Plastics;  Laterality: Left;  . LAPAROSCOPIC VAGINAL HYSTERECTOMY WITH SALPINGO OOPHORECTOMY Bilateral 06/17/2016   Procedure: LAPAROSCOPIC ASSISTED VAGINAL HYSTERECTOMY WITH SALPINGO OOPHORECTOMY;  Surgeon: Arvella Nigh, MD;  Location: [place];  Service: Gynecology;  Laterality: Bilateral;  need bed  . LIPOSUCTION WITH LIPOFILLING Left 04/09/2017   Procedure: LIPOFILLING TO THE LEFT UPPER BREAST TO IMPROVE SYMMETRY.;  Surgeon: Wallace Going, DO;  Location: [place];  Service: Plastics;  Laterality: Left;  Marland Kitchen MASTECTOMY W/ SENTINEL NODE BIOPSY Bilateral 09/08/2016   Procedure: LEFT MASTECTOMY WITH LEFT SENTINEL LYMPH NODE BIOPSY, RIGHT PROPHYLACTIC MASTECTOMY;  Surgeon: Autumn Messing III, MD;  Location: [place];  Service: General;  Laterality: Bilateral;  . PALATE TO GINGIVA GRAFT    . REMOVAL OF BILATERAL TISSUE EXPANDERS WITH PLACEMENT OF BILATERAL BREAST IMPLANTS Bilateral 01/01/2017   Procedure: REMOVAL OF BILATERAL TISSUE EXPANDERS WITH PLACEMENT OF BILATERAL SILICONE BREAST IMPLANTS WITH LIPOSUCTION;  Surgeon: Wallace Going, DO;  Location: [place];  Service: Plastics;  Laterality: Bilateral;  . TISSUE EXPANDER PLACEMENT Bilateral 09/08/2016   Procedure: IMMEDIATE PLACEMENT OF BILATERAL TISSUE EXPANDERS AFTER MASTECTOMIES;  Surgeon: Loel Lofty Dillingham, DO;  Location: [place];  Service: Plastics;  Laterality: Bilateral;    Family History  Problem Relation Age of Onset  . Breast cancer Mother 79  . Heart disease Mother        Atrial fibrillation  . Melanoma Father 69  . Breast cancer Sister 29       ER+/her2-; onco score = 8  .  Cancer Maternal Aunt        oral cancer; smoker  . Head & neck cancer Paternal Uncle        smoker  . Breast cancer Cousin        dx in her late 70s-60s    Allergies  Allergen Reactions  . Penicillins Rash    Current Outpatient Medications on File Prior to Visit  Medication Sig Dispense Refill  . aspirin-acetaminophen-caffeine (EXCEDRIN MIGRAINE) 250-250-65 MG tablet Take by mouth.    . Calcium Carbonate-Vitamin D (CALCIUM + D PO) Take 2 tablets by mouth daily.     . cholecalciferol (VITAMIN D) 1000 units tablet Take 1,000 Units by mouth daily.     Marland Kitchen  KRILL OIL OMEGA-3 PO Take 1 tablet by mouth daily.     Marland Kitchen levothyroxine (SYNTHROID, LEVOTHROID) 125 MCG tablet Take 125 mcg by mouth daily before breakfast.    . magnesium oxide (MAG-OX) 400 MG tablet Take 400 mg by mouth daily.    . megestrol (MEGACE) 20 MG tablet Take 1 tablet (20 mg total) by mouth daily. 30 tablet 6  . Melatonin 5 MG TABS Take 10 mg by mouth at bedtime.     . Multiple Vitamin (MULTIVITAMIN) tablet Take 1 tablet by mouth daily.    Marland Kitchen omeprazole (PRILOSEC) 20 MG capsule Take 40 mg by mouth daily.    . Riboflavin 400 MG TABS Take by mouth.    . rizatriptan (MAXALT) 10 MG tablet Take 10 mg by mouth as needed for migraine. May repeat in 2 hours if needed    . SUMAtriptan 6 MG/0.5ML SOAJ Inject into the skin.    Marland Kitchen vitamin E (VITAMIN E) 1000 UNIT capsule Take 1,000 Units by mouth daily.     No current facility-administered medications on file prior to visit.     BP 111/69 (BP Location: Right Arm, Cuff Size: Large)   Pulse 85   Temp 98.8 F (37.1 C) (Oral)   Resp 18   Ht 5' 7.25" (1.708 m)   Wt 217 lb (98.4 kg)   LMP 02/01/2014   SpO2 100%   BMI 33.73 kg/m        Objective:   Physical Exam  Constitutional: She is oriented to person, place, and time. She appears well-developed and well-nourished.  Cardiovascular: Normal rate, regular rhythm and normal heart sounds.  No murmur heard. Pulmonary/Chest: Effort  normal and breath sounds normal. No respiratory distress. She has no wheezes.  Abdominal: Soft. Bowel sounds are normal. She exhibits no distension. There is no tenderness.  Musculoskeletal: She exhibits no edema.  Neurological: She is alert and oriented to person, place, and time.  Skin: Skin is warm and dry.  Psychiatric: She has a normal mood and affect. Her behavior is normal. Judgment and thought content normal.          Assessment & Plan:  Weight gain- I think that this is due to Megace.  Will touch base with her oncologist RE: discontinuation of megace.  Wt Readings from Last 3 Encounters:  12/30/17 217 lb (98.4 kg)  08/03/17 200 lb (90.7 kg)  04/09/17 195 lb (88.5 kg)   Cough-  I believe that her GERD symptoms continue to contribute.  Her recent weight gain has also likely exacerbated her gerd.  Will d/co omeprazole and start her on protonix 68m bid. We also discussed gerd diet and raising the head of her bed.    Depression- new/uncontrolled. Will give trial of effexor.  Hot flashes- stable on megace- will plan to d/c megace if ok with oncology. Start trial of effexor for hot flashes.  Migraines- uncontrolled. Refer to Neuro for further evaluation/management.   Hypothyroid- on synthroid- this is being prescribed and managed by GYN.  Hx of breast cancer- s/p bilateral mastectomy- surveillance per oncology.

## 2017-12-30 NOTE — Patient Instructions (Signed)
Stop otc omeprazole, start protonix 40mg  twice daily for reflux. Add zyrtec 10mg  once daily. Work on reflux diet.  Start effexor 1 tab once daily for 3 days, then increase to 2 tabs once daily.

## 2017-12-31 ENCOUNTER — Telehealth: Payer: Self-pay | Admitting: Family

## 2017-12-31 DIAGNOSIS — E039 Hypothyroidism, unspecified: Secondary | ICD-10-CM | POA: Insufficient documentation

## 2017-12-31 DIAGNOSIS — F329 Major depressive disorder, single episode, unspecified: Secondary | ICD-10-CM | POA: Insufficient documentation

## 2017-12-31 DIAGNOSIS — Z853 Personal history of malignant neoplasm of breast: Secondary | ICD-10-CM | POA: Insufficient documentation

## 2017-12-31 DIAGNOSIS — R232 Flushing: Secondary | ICD-10-CM | POA: Insufficient documentation

## 2017-12-31 DIAGNOSIS — K219 Gastro-esophageal reflux disease without esophagitis: Secondary | ICD-10-CM | POA: Insufficient documentation

## 2017-12-31 DIAGNOSIS — F32A Depression, unspecified: Secondary | ICD-10-CM | POA: Insufficient documentation

## 2017-12-31 NOTE — Telephone Encounter (Signed)
Please contact pt and let her know that I reviewed with Dr Marin Olp and he recommends that she stop the megace.

## 2017-12-31 NOTE — Telephone Encounter (Signed)
-----   Message from [name], MD sent at 12/31/2017  9:51 AM EDT ----- Olevia Bowens her heart!!  Please stop the Megace!!  Laurey Arrow ----- Message ----- From: Debbrah Alar, NP Sent: 12/31/2017   9:12 AM To: [name], MD  Laurey Arrow,  [name] came to establish with me yesterday.  She has gained nearly 20 pounds on megace which I believe you started her on for hot flashes.  She is also depressed.  I am planning to have her start effexor to help with depression and hot flashes. Is it OK with you if we discontinue her megace?  Thanks,  [employer]

## 2018-01-06 NOTE — Telephone Encounter (Signed)
Patient advised per Dr. Antonieta Pert recommendations to discontinue the Megace. Patient asked if that was ok with [name], patient advised Lenna Sciara was ok with stopping the medication.

## 2018-02-01 ENCOUNTER — Encounter: Payer: Self-pay | Admitting: Hematology & Oncology

## 2018-02-01 ENCOUNTER — Inpatient Hospital Stay: Payer: BC Managed Care – PPO

## 2018-02-01 ENCOUNTER — Other Ambulatory Visit: Payer: Self-pay

## 2018-02-01 ENCOUNTER — Inpatient Hospital Stay: Payer: BC Managed Care – PPO | Attending: Hematology & Oncology | Admitting: Hematology & Oncology

## 2018-02-01 VITALS — BP 114/72 | HR 83 | Temp 98.5°F | Resp 16 | Wt 219.0 lb

## 2018-02-01 DIAGNOSIS — G8929 Other chronic pain: Secondary | ICD-10-CM

## 2018-02-01 DIAGNOSIS — M818 Other osteoporosis without current pathological fracture: Secondary | ICD-10-CM

## 2018-02-01 DIAGNOSIS — T386X5A Adverse effect of antigonadotrophins, antiestrogens, antiandrogens, not elsewhere classified, initial encounter: Secondary | ICD-10-CM

## 2018-02-01 DIAGNOSIS — D0512 Intraductal carcinoma in situ of left breast: Secondary | ICD-10-CM

## 2018-02-01 DIAGNOSIS — R0789 Other chest pain: Secondary | ICD-10-CM

## 2018-02-01 LAB — CBC WITH DIFFERENTIAL (CANCER CENTER ONLY)
Basophils Absolute: 0 10*3/uL (ref 0.0–0.1)
Basophils Relative: 1 %
Eosinophils Absolute: 0.1 10*3/uL (ref 0.0–0.5)
Eosinophils Relative: 2 %
HEMATOCRIT: 42.5 % (ref 34.8–46.6)
HEMOGLOBIN: 14.3 g/dL (ref 11.6–15.9)
LYMPHS ABS: 2.8 10*3/uL (ref 0.9–3.3)
LYMPHS PCT: 45 %
MCH: 30.4 pg (ref 26.0–34.0)
MCHC: 33.6 g/dL (ref 32.0–36.0)
MCV: 90.2 fL (ref 81.0–101.0)
MONOS PCT: 10 %
Monocytes Absolute: 0.6 10*3/uL (ref 0.1–0.9)
NEUTROS ABS: 2.6 10*3/uL (ref 1.5–6.5)
NEUTROS PCT: 42 %
Platelet Count: 296 10*3/uL (ref 145–400)
RBC: 4.71 MIL/uL (ref 3.70–5.32)
RDW: 12.4 % (ref 11.1–15.7)
WBC: 6.1 10*3/uL (ref 3.9–10.0)

## 2018-02-01 LAB — CMP (CANCER CENTER ONLY)
ALT: 22 U/L (ref 0–44)
ANION GAP: 8 (ref 5–15)
AST: 20 U/L (ref 15–41)
Albumin: 4.4 g/dL (ref 3.5–5.0)
Alkaline Phosphatase: 71 U/L (ref 38–126)
BUN: 16 mg/dL (ref 6–20)
CHLORIDE: 99 mmol/L (ref 98–111)
CO2: 30 mmol/L (ref 22–32)
CREATININE: 0.93 mg/dL (ref 0.44–1.00)
Calcium: 10.2 mg/dL (ref 8.9–10.3)
GFR, Estimated: >60 mL/min (ref >60)
Glucose, Bld: 88 mg/dL (ref 70–99)
POTASSIUM: 4.3 mmol/L (ref 3.5–5.1)
SODIUM: 137 mmol/L (ref 135–145)
Total Bilirubin: 0.4 mg/dL (ref 0.3–1.2)
Total Protein: 7.7 g/dL (ref 6.5–8.1)

## 2018-02-01 NOTE — Progress Notes (Signed)
Hematology and Oncology Follow Up Visit  [name] [id number] Sep 24, 1965 52 y.o. 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  She is under a lot of stress right now.  She is taking care of her father.  He unfortunately needed leg amputation because of peripheral vascular disease.  He also required a feeding tube to be placed as he was not eating.  She is doing a great job trying to help him out.  She is gained weight.  I think this is probably from the Megace that she was on for hot flashes.  Her family doctor took her off the Megace and put her on Effexor which is helping.  She is having more headaches.  She is seeing a neurologist next month.  She is on Maxalt right now.  She is had no rashes.  She is had no change in bowel or bladder habits.  She is had no cough or shortness of breath.  She is had no fever.  She is had no bleeding.  There is been no leg swelling.  Overall, her performance status is ECOG 1.    Medications:  Current Outpatient Medications:  .  aspirin-acetaminophen-caffeine (EXCEDRIN MIGRAINE) 250-250-65 MG tablet, Take by mouth., Disp: , Rfl:  .  Calcium Carbonate-Vitamin D (CALCIUM + D PO), Take 2 tablets by mouth daily. , Disp: , Rfl:  .  cholecalciferol (VITAMIN D) 1000 units tablet, Take 1,000 Units by mouth daily. , Disp: , Rfl:  .  KRILL OIL OMEGA-3 PO, Take 1 tablet by mouth daily. , Disp: , Rfl:  .  levothyroxine (SYNTHROID, LEVOTHROID) 125 MCG tablet, Take 125 mcg by mouth daily before breakfast., Disp: , Rfl:  .  magnesium oxide (MAG-OX) 400 MG tablet, Take 400 mg by mouth daily., Disp: , Rfl:  .  Melatonin 5 MG TABS, Take 10 mg by mouth at bedtime. , Disp: , Rfl:  .  Multiple Vitamin (MULTIVITAMIN) tablet, Take 1 tablet by mouth daily., Disp: , Rfl:  .  pantoprazole (PROTONIX) 40 MG tablet, Take 1  tablet (40 mg total) by mouth 2 (two) times daily., Disp: 60 tablet, Rfl: 1 .  Riboflavin 400 MG TABS, Take by mouth., Disp: , Rfl:  .  rizatriptan (MAXALT) 10 MG tablet, Take 10 mg by mouth as needed for migraine. May repeat in 2 hours if needed, Disp: , Rfl:  .  SUMAtriptan 6 MG/0.5ML SOAJ, Inject into the skin., Disp: , Rfl:  .  venlafaxine (EFFEXOR) 37.5 MG tablet, 1 tab once daily for 3 days, then increase to 2 tabs once daily, Disp: 60 tablet, Rfl: 0 .  vitamin E (VITAMIN E) 1000 UNIT capsule, Take 1,000 Units by mouth daily., Disp: , Rfl:   Allergies:  Allergies  Allergen Reactions  . Penicillins Rash    Past Medical History, Surgical history, Social history, and Family History were reviewed and updated.  Review of Systems: Review of Systems  All other systems reviewed and are negative.    Physical Exam:  weight is 219 lb (99.3 kg). Her oral temperature is 98.5 F (36.9 C). Her blood pressure is 114/72 and her pulse is 83. Her respiration is 16 and oxygen saturation is 97%.   Wt Readings from Last 3 Encounters:  02/01/18 219 lb (99.3 kg)  12/30/17 217 lb (98.4 kg)  08/03/17 200  lb (90.7 kg)     Physical Exam  Constitutional: She is oriented to person, place, and time.  Breast exam shows bilateral mastectomies.  She has implants.  Everything looks well-healed.  She has no adenopathy in the axilla bilaterally.  HENT:  Head: Normocephalic and atraumatic.  Mouth/Throat: Oropharynx is clear and moist.  Eyes: Pupils are equal, round, and reactive to light. EOM are normal.  Neck: Normal range of motion.  Cardiovascular: Normal rate, regular rhythm and normal heart sounds.  Pulmonary/Chest: Effort normal and breath sounds normal.  Abdominal: Soft. Bowel sounds are normal.  Musculoskeletal: Normal range of motion. She exhibits no edema, tenderness or deformity.  Lymphadenopathy:    She has no cervical adenopathy.  Neurological: She is alert and oriented to person, place,  and time.  Skin: Skin is warm and dry. No rash noted. No erythema.  Psychiatric: She has a normal mood and affect. Her behavior is normal. Judgment and thought content normal.  Vitals reviewed.   Lab Results  Component Value Date   WBC 6.1 02/01/2018   HGB 14.3 02/01/2018   HCT 42.5 02/01/2018   MCV 90.2 02/01/2018   PLT 296 02/01/2018     Chemistry      Component Value Date/Time   NA 136 08/03/2017 0746   NA 139 01/29/2017 0747   NA 138 05/30/2016 0746   K 4.2 08/03/2017 0746   K 3.8 01/29/2017 0747   K 3.8 05/30/2016 0746   CL 103 08/03/2017 0746   CL 105 01/29/2017 0747   CO2 25 08/03/2017 0746   CO2 30 01/29/2017 0747   CO2 25 05/30/2016 0746   BUN 9 08/03/2017 0746   BUN 7 01/29/2017 0747   BUN 10.0 05/30/2016 0746   CREATININE 0.79 08/03/2017 0746   CREATININE 0.8 01/29/2017 0747   CREATININE 0.7 05/30/2016 0746      Component Value Date/Time   CALCIUM 9.5 08/03/2017 0746   CALCIUM 9.3 01/29/2017 0747   CALCIUM 9.2 05/30/2016 0746   ALKPHOS 87 08/03/2017 0746   ALKPHOS 70 01/29/2017 0747   ALKPHOS 71 05/30/2016 0746   AST 17 08/03/2017 0746   AST 16 05/30/2016 0746   ALT 20 08/03/2017 0746   ALT 18 01/29/2017 0747   ALT 11 05/30/2016 0746   BILITOT 0.4 08/03/2017 0746   BILITOT 0.83 05/30/2016 0746         Impression and Plan: [name] is a 52 year old white female. She had ductal carcinoma in situ of the left breast. This was multifocal. She underwent bilateral mastectomies.  For right now, we will see about making a referral to physical therapy.  May be, they can give her some exercises to try to help stretch the chest wall muscles.  I will certainly pray hard for her as she continues to help with her father.  I will plan to see her back in 6 more months.  Now that she is off Megace, maybe her weight will come down.  [name], MD 7/8/20198:46 AM

## 2018-02-02 ENCOUNTER — Encounter: Payer: Self-pay | Admitting: *Deleted

## 2018-02-02 LAB — VITAMIN D 25 HYDROXY (VIT D DEFICIENCY, FRACTURES): Vit D, 25-Hydroxy: 34.7 ng/mL (ref 30.0–100.0)

## 2018-02-10 ENCOUNTER — Ambulatory Visit (INDEPENDENT_AMBULATORY_CARE_PROVIDER_SITE_OTHER): Payer: BC Managed Care – PPO | Admitting: Family

## 2018-02-10 ENCOUNTER — Encounter: Payer: Self-pay | Admitting: Family

## 2018-02-10 VITALS — BP 110/69 | HR 83 | Temp 98.4°F | Resp 18 | Ht 67.25 in | Wt 221.0 lb

## 2018-02-10 DIAGNOSIS — R059 Cough, unspecified: Secondary | ICD-10-CM

## 2018-02-10 DIAGNOSIS — K219 Gastro-esophageal reflux disease without esophagitis: Secondary | ICD-10-CM

## 2018-02-10 DIAGNOSIS — F329 Major depressive disorder, single episode, unspecified: Secondary | ICD-10-CM | POA: Diagnosis not present

## 2018-02-10 DIAGNOSIS — E039 Hypothyroidism, unspecified: Secondary | ICD-10-CM

## 2018-02-10 DIAGNOSIS — G43909 Migraine, unspecified, not intractable, without status migrainosus: Secondary | ICD-10-CM

## 2018-02-10 DIAGNOSIS — R05 Cough: Secondary | ICD-10-CM

## 2018-02-10 DIAGNOSIS — R635 Abnormal weight gain: Secondary | ICD-10-CM | POA: Diagnosis not present

## 2018-02-10 DIAGNOSIS — F32A Depression, unspecified: Secondary | ICD-10-CM

## 2018-02-10 LAB — TSH: TSH: 1.97 u[IU]/mL (ref 0.35–4.50)

## 2018-02-10 MED ORDER — RIZATRIPTAN BENZOATE 10 MG PO TABS: 10.0000 mg | ORAL_TABLET | ORAL | 0 refills | Status: DC | PRN

## 2018-02-10 MED ORDER — ESCITALOPRAM OXALATE 5 MG PO TABS: 5.0000 mg | ORAL_TABLET | Freq: Every day | ORAL | 1 refills | Status: DC

## 2018-02-10 NOTE — Progress Notes (Signed)
Subjective:    Patient ID: [name], female    DOB: [birth year], 52 y.o.   MRN: [id number]  HPI  Patient is a 52 year old female who presents today for follow-up.  We initially saw her on December 30, 2017.  At that time she had several complaints:  Weight gain-this was felt to be secondary to Megace which is being prescribed by oncology.  Oncology recommended discontinuation of Megace. She discontinued megace.  Reports dad was in the hospital and she ate very poorly. She has discontinued megace.   Wt Readings from Last 3 Encounters:  02/10/18 221 lb (100.2 kg)  02/01/18 219 lb (99.3 kg)  12/30/17 217 lb (98.4 kg)   Cough-patient noted cough last visit and we felt that this was likely being exacerbated by her uncontrolled GERD symptoms.  We increased her Protonix to 40 mg twice daily recommended raising the head of her bed. Reports that her gerd symptoms are improved but not resolved.  Cough is better.   Depression- last visit we gave her a trial of Effexor.  She noted a lot of situational stressors including being the caregiver for her sick father. She reports that she had nausea on this medication and had to discontinue. Reports that she can be tearful at times.  She feels that this is situational.   Migraines- last visit she noted that her migraines were uncontrolled and we placed a referral to neurology. Has appointment scheduled next month. Migraines have worsened which she attributes to stress.   Hypothyroid- this is being managed by her gynecologist.  Review of Systems See HPI  Past Medical History:  Diagnosis Date  . Abrasion of right arm 12/25/2016  . Complication of anesthesia    panic attack while waking up  . History of breast cancer 2014  . Hypothyroidism   . Migraines   . PONV (postoperative nausea and vomiting)      Social History   Socioeconomic History  . Marital status: Married    Spouse name: [name]  . Number of children: 0  . Years of education: Masters    . Highest education level: Not on file  Occupational History  . Occupation: Medical Records  Social Needs  . Financial resource strain: Not on file  . Food insecurity:    Worry: Not on file    Inability: Not on file  . Transportation needs:    Medical: Not on file    Non-medical: Not on file  Tobacco Use  . Smoking status: Never Smoker  . Smokeless tobacco: Never Used  Substance and Sexual Activity  . Alcohol use: No    Alcohol/week: 0.0 oz  . Drug use: No  . Sexual activity: Yes  Lifestyle  . Physical activity:    Days per week: Not on file    Minutes per session: Not on file  . Stress: Not on file  Relationships  . Social connections:    Talks on phone: Not on file    Gets together: Not on file    Attends religious service: Not on file    Active member of club or organization: Not on file    Attends meetings of clubs or organizations: Not on file    Relationship status: Not on file  . Intimate partner violence:    Fear of current or ex partner: Not on file    Emotionally abused: Not on file    Physically abused: Not on file    Forced sexual activity: Not on file  Other Topics Concern  . Not on file  Social History Narrative   Lives at home with husband.   Right-handed.   2-3 cups caffeine daily.   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 BREAST LUMPECTOMY WITH NEEDLE LOCALIZATION Left 06/20/2013   Procedure: BREAST LUMPECTOMY WITH NEEDLE LOCALIZATION;  Surgeon: Merrie Roof, MD;  Location: [place];  Service: General;  Laterality: Left;  . BREAST RECONSTRUCTION Left 04/09/2017   Procedure: REVISION BREAST RECONSTRUCTION;  Surgeon: Wallace Going, DO;  Location: [place];  Service: Plastics;  Laterality: Left;  . LAPAROSCOPIC VAGINAL HYSTERECTOMY WITH SALPINGO OOPHORECTOMY  Bilateral 06/17/2016   Procedure: LAPAROSCOPIC ASSISTED VAGINAL HYSTERECTOMY WITH SALPINGO OOPHORECTOMY;  Surgeon: Arvella Nigh, MD;  Location: [place];  Service: Gynecology;  Laterality: Bilateral;  need bed  . LIPOSUCTION WITH LIPOFILLING Left 04/09/2017   Procedure: LIPOFILLING TO THE LEFT UPPER BREAST TO IMPROVE SYMMETRY.;  Surgeon: Wallace Going, DO;  Location: [facility] [place];  Service: Plastics;  Laterality: Left;  Marland Kitchen MASTECTOMY W/ SENTINEL NODE BIOPSY Bilateral 09/08/2016   Procedure: LEFT MASTECTOMY WITH LEFT SENTINEL LYMPH NODE BIOPSY, RIGHT PROPHYLACTIC MASTECTOMY;  Surgeon: Autumn Messing III, MD;  Location: [place];  Service: General;  Laterality: Bilateral;  . PALATE TO GINGIVA GRAFT    . REMOVAL OF BILATERAL TISSUE EXPANDERS WITH PLACEMENT OF BILATERAL BREAST IMPLANTS Bilateral 01/01/2017   Procedure: REMOVAL OF BILATERAL TISSUE EXPANDERS WITH PLACEMENT OF BILATERAL SILICONE BREAST IMPLANTS WITH LIPOSUCTION;  Surgeon: Wallace Going, DO;  Location: [place];  Service: Plastics;  Laterality: Bilateral;  . TISSUE EXPANDER PLACEMENT Bilateral 09/08/2016   Procedure: IMMEDIATE PLACEMENT OF BILATERAL TISSUE EXPANDERS AFTER MASTECTOMIES;  Surgeon: Loel Lofty Dillingham, DO;  Location: [place];  Service: Plastics;  Laterality: Bilateral;    Family History  Problem Relation Age of Onset  . Breast cancer Mother 52  . Heart disease Mother        Atrial fibrillation  . Melanoma Father 33  . Breast cancer Sister 26       ER+/her2-; onco score = 8  . Cancer Maternal Aunt        oral cancer; smoker  . Head & neck cancer Paternal Uncle        smoker  . Breast cancer Cousin        dx in her late 12s-60s    Allergies  Allergen Reactions  . Penicillins Rash    Current Outpatient Medications on File Prior to Visit  Medication Sig Dispense Refill  . aspirin-acetaminophen-caffeine (EXCEDRIN MIGRAINE) 250-250-65 MG tablet Take by mouth.    .  Calcium Carbonate-Vitamin D (CALCIUM + D PO) Take 2 tablets by mouth daily.     . cholecalciferol (VITAMIN D) 1000 units tablet Take 1,000 Units by mouth daily.     Marland Kitchen KRILL OIL OMEGA-3 PO Take 1 tablet by mouth daily.     Marland Kitchen levothyroxine (SYNTHROID, LEVOTHROID) 125 MCG tablet Take 125 mcg by mouth daily before breakfast.    . magnesium oxide (MAG-OX) 400 MG tablet Take 400 mg by mouth daily.    . Melatonin 5 MG TABS Take 10 mg by mouth at bedtime.     . Multiple Vitamin (MULTIVITAMIN) tablet Take 1 tablet by mouth daily.    . pantoprazole (PROTONIX) 40 MG  tablet Take 1 tablet (40 mg total) by mouth 2 (two) times daily. 60 tablet 1  . Riboflavin 400 MG TABS Take by mouth.    . rizatriptan (MAXALT) 10 MG tablet Take 10 mg by mouth as needed for migraine. May repeat in 2 hours if needed    . SUMAtriptan 6 MG/0.5ML SOAJ Inject into the skin.    Marland Kitchen vitamin E (VITAMIN E) 1000 UNIT capsule Take 1,000 Units by mouth daily.     No current facility-administered medications on file prior to visit.     BP 110/69 (BP Location: Right Arm, Cuff Size: Large)   Pulse 83   Temp 98.4 F (36.9 C) (Oral)   Resp 18   Ht 5' 7.25" (1.708 m)   Wt 221 lb (100.2 kg)   LMP 02/01/2014   SpO2 99%   BMI 34.36 kg/m       Objective:   Physical Exam  Constitutional: She is oriented to person, place, and time. She appears well-developed and well-nourished.  Cardiovascular: Normal rate, regular rhythm and normal heart sounds.  No murmur heard. Pulmonary/Chest: Effort normal and breath sounds normal. No respiratory distress. She has no wheezes.  Neurological: She is alert and oriented to person, place, and time.  Skin: Skin is warm and dry.  Psychiatric: Her behavior is normal. Judgment and thought content normal.  Slightly flat affect          Assessment & Plan:  Depression-depression remains uncontrolled.  We will give trial of low-dose Lexapro 5 mg once daily.  Plan follow-up in 4 to 6 weeks.  If patient  tolerates medication and her symptoms remain uncontrolled can consider increasing to 10 mg.  I am hopeful this will also help with her insomnia.  GERD-symptoms are improved.  They are not optimized however.  She understands that weight loss will help as well as additional dietary changes.  She continues to drink coffee and eat tomato based products.  Continue Protonix.  Migraines-uncontrolled.  Requests refill on her triptan.  She has an upcoming appointment scheduled with neurology.  Weight gain-suspect this is related to her depression and poor dietary choices.  However it has been since September since her GYN reportedly checked her thyroid.  Will check TSH today to make sure that this is not contributing to her recent weight gain.  Cough- improved. Was likely related to uncontrolled GERD.

## 2018-02-10 NOTE — Patient Instructions (Addendum)
Please begin lexapro 5mg  once a day for depression.  Complete lab work prior to leaving.

## 2018-02-26 ENCOUNTER — Other Ambulatory Visit: Payer: Self-pay | Admitting: Family

## 2018-03-16 ENCOUNTER — Encounter: Payer: Self-pay | Admitting: Neurology

## 2018-03-16 ENCOUNTER — Other Ambulatory Visit: Payer: Self-pay

## 2018-03-16 ENCOUNTER — Ambulatory Visit (INDEPENDENT_AMBULATORY_CARE_PROVIDER_SITE_OTHER): Payer: BC Managed Care – PPO | Admitting: Neurology

## 2018-03-16 VITALS — BP 120/77 | HR 95 | Resp 16 | Ht 67.25 in | Wt 218.0 lb

## 2018-03-16 DIAGNOSIS — G43709 Chronic migraine without aura, not intractable, without status migrainosus: Secondary | ICD-10-CM

## 2018-03-16 DIAGNOSIS — IMO0002 Reserved for concepts with insufficient information to code with codable children: Secondary | ICD-10-CM

## 2018-03-16 MED ORDER — ONDANSETRON 4 MG PO TBDP: 4.0000 mg | ORAL_TABLET | Freq: Three times a day (TID) | ORAL | 6 refills | Status: DC | PRN

## 2018-03-16 MED ORDER — NORTRIPTYLINE HCL 10 MG PO CAPS: 30.0000 mg | ORAL_CAPSULE | Freq: Every day | ORAL | 11 refills | Status: DC

## 2018-03-16 MED ORDER — SUMATRIPTAN SUCCINATE 6 MG/0.5ML ~~LOC~~ SOAJ: 6.0000 mg | SUBCUTANEOUS | 11 refills | Status: DC | PRN

## 2018-03-16 MED ORDER — TIZANIDINE HCL 4 MG PO TABS: 4.0000 mg | ORAL_TABLET | Freq: Four times a day (QID) | ORAL | 6 refills | Status: DC | PRN

## 2018-03-16 MED ORDER — RIZATRIPTAN BENZOATE 10 MG PO TBDP: 10.0000 mg | ORAL_TABLET | ORAL | 11 refills | Status: DC | PRN

## 2018-03-16 NOTE — Progress Notes (Signed)
-     PATIENT: [name] DOB: Feb 01, 1966  Chief Complaint  Patient presents with  . Headache    Rm. 4.  PCP: Debbrah Alar.  Last seen by Dr. Krista Blue 07/15/16 for migraines.  Sts. she never started Inderal rx'd at last ov--doesn't remember why.  Sts. h/a's are more severe, gets very short lived relief with Maxalt/Imitrex/fiim     HISTORICAL  [name] is a 52 years old right-handed female, seen in refer by Dr. Arnetha Gula for evaluation of increased migraine headache on Dec 10 2015  She had a history of left breast cancer, status post left lobectomy in November 2014, taking Aromasin  She reported a history of migraine since 1994, her typical migraine are severe pounding headache with associated light noise sensitivity, nauseous, lasting for 2-3 days, she has to take off from work, she had frequent headaches around her mid thirties, even then she has headaches about every few months,  Since January 2017, after she stopped having migraines for few years, she began to have headaches again, she is under a lot of stress, taking care of her elderly father who suffered Alzheimer's disease  She is taking over-the-counter Excedrin Migraine with limited help, has tried different over-the-counter medications, Tylenol, Aleve, ice pack, pressure band with no help, she has never tried prescription medications   She has gone through menopause from September 2016 to February 2017.  Laboratory evaluation in December 2016, normal thyroid function test, F S H 30.7 normal CMP with creatinine of 0.65, lipid profile showed normal cholesterol 151, LDL 87, vitamin D level was within normal limits 40   UPDATE August 17th 2017: Since last visit in May, she used all prescribed Maxalt for her migraine, average 1 typical migraine each week, Sometimes the migraine is prolonged, require multiple dose of Maxalt, she also complains of excessive stress at home, taking care of her elderly parents, difficulty  sleeping,   UPDATE Dec 19th 2017: She has increased migraine since her hysterectomy in Nov 2017, she has daily migraine since Nov 21, she is now taking Maxalt, which is helpful, imitrex SQ helps her headache, but her arm felt numb for 2 hours.  UPDATE March 16 2018: She has lost follow-up since December 2017, no complaint worsening headaches, 2 to 3 days in a week, she would suffer severe left-sided pounding headache with associated light noise sensitivity, nauseous, headache can last for a few days, only temporarily relieved by a repeat dose of Maxalt or Imitrex, sometimes she makes triptan with Excedrin Migraine.  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 Penicillins Rash    HOME MEDICATIONS: Current Outpatient Medications  Medication Sig Dispense Refill  . aspirin-acetaminophen-caffeine (EXCEDRIN MIGRAINE) 250-250-65 MG tablet Take by mouth.    . Calcium Carbonate-Vitamin D (CALCIUM + D PO) Take 2 tablets by mouth daily.     . cholecalciferol (VITAMIN D) 1000 units tablet Take 1,000 Units by mouth daily.     Marland Kitchen escitalopram (LEXAPRO) 5 MG tablet Take 1 tablet (5 mg total) by mouth daily. 30 tablet 1  . KRILL OIL OMEGA-3 PO Take 1 tablet by mouth daily.     Marland Kitchen levothyroxine (SYNTHROID, LEVOTHROID) 125 MCG tablet Take 125 mcg by mouth daily before  breakfast.    . magnesium oxide (MAG-OX) 400 MG tablet Take 400 mg by mouth daily.    . Multiple Vitamin (MULTIVITAMIN) tablet Take 1 tablet by mouth daily.    . pantoprazole (PROTONIX) 40 MG tablet TAKE 1 TABLET(40 MG) BY MOUTH TWICE DAILY 60 tablet 0  . Riboflavin 400 MG TABS Take by mouth.    . rizatriptan (MAXALT) 10 MG tablet Take 1 tablet (10 mg total) by mouth as needed for migraine.  May repeat in 2 hours if needed 15 tablet 0  . vitamin E (VITAMIN E) 1000 UNIT capsule Take 1,000 Units by mouth daily.    . SUMAtriptan 6 MG/0.5ML SOAJ Inject into the skin.     No current facility-administered medications for this visit.     PAST MEDICAL HISTORY: Past Medical History:  Diagnosis Date  . Abrasion of right arm 12/25/2016  . Complication of anesthesia    panic attack while waking up  . History of breast cancer 2014  . Hypothyroidism   . Migraines   . PONV (postoperative nausea and vomiting)     PAST SURGICAL HISTORY: Past Surgical History:  Procedure Laterality Date  . BREAST LUMPECTOMY WITH NEEDLE LOCALIZATION Left 06/20/2013   Procedure: BREAST LUMPECTOMY WITH NEEDLE LOCALIZATION;  Surgeon: Merrie Roof, MD;  Location: [facility];  Service: General;  Laterality: Left;  . BREAST RECONSTRUCTION Left 04/09/2017   Procedure: REVISION BREAST RECONSTRUCTION;  Surgeon: Wallace Going, DO;  Location: [facility];  Service: Plastics;  Laterality: Left;  . LAPAROSCOPIC VAGINAL HYSTERECTOMY WITH SALPINGO OOPHORECTOMY Bilateral 06/17/2016   Procedure: LAPAROSCOPIC ASSISTED VAGINAL HYSTERECTOMY WITH SALPINGO OOPHORECTOMY;  Surgeon: Arvella Nigh, MD;  Location: [facility];  Service: Gynecology;  Laterality: Bilateral;  need bed  . LIPOSUCTION WITH LIPOFILLING Left 04/09/2017   Procedure: LIPOFILLING TO THE LEFT UPPER BREAST TO IMPROVE SYMMETRY.;  Surgeon: Wallace Going, DO;  Location: [facility];  Service: Plastics;  Laterality: Left;  Marland Kitchen MASTECTOMY W/ SENTINEL NODE BIOPSY Bilateral 09/08/2016   Procedure: LEFT MASTECTOMY WITH LEFT SENTINEL LYMPH NODE BIOPSY, RIGHT PROPHYLACTIC MASTECTOMY;  Surgeon: Autumn Messing III, MD;  Location: [place];  Service: General;  Laterality: Bilateral;  . PALATE TO GINGIVA GRAFT    . REMOVAL OF BILATERAL TISSUE EXPANDERS WITH PLACEMENT OF BILATERAL BREAST IMPLANTS Bilateral 01/01/2017     Procedure: REMOVAL OF BILATERAL TISSUE EXPANDERS WITH PLACEMENT OF BILATERAL SILICONE BREAST IMPLANTS WITH LIPOSUCTION;  Surgeon: Wallace Going, DO;  Location: [facility];  Service: Plastics;  Laterality: Bilateral;  . TISSUE EXPANDER PLACEMENT Bilateral 09/08/2016   Procedure: IMMEDIATE PLACEMENT OF BILATERAL TISSUE EXPANDERS AFTER MASTECTOMIES;  Surgeon: Loel Lofty Dillingham, DO;  Location: [facility];  Service: Plastics;  Laterality: Bilateral;    FAMILY HISTORY: Family History  Problem Relation Age of Onset  . Breast cancer Mother 65  . Heart disease Mother        Atrial fibrillation  . Melanoma Father 31  . COPD Father   . Arthritis Father   . Breast cancer Sister 62       ER+/her2-; onco score = 8  . Cancer Maternal Aunt        oral cancer; smoker  . Head & neck cancer Paternal Uncle        smoker  . Breast cancer Cousin        dx in her late 29s-60s    SOCIAL HISTORY:  Social History   Socioeconomic History  .  Marital status: Married    Spouse name: [name]  . Number of children: 0  . Years of education: Masters  . Highest education level: Not on file  Occupational History  . Occupation: Medical Records  Social Needs  . Financial resource strain: Not on file  . Food insecurity:    Worry: Not on file    Inability: Not on file  . Transportation needs:    Medical: Not on file    Non-medical: Not on file  Tobacco Use  . Smoking status: Never Smoker  . Smokeless tobacco: Never Used  Substance and Sexual Activity  . Alcohol use: No    Alcohol/week: 0.0 standard drinks  . Drug use: No  . Sexual activity: Yes  Lifestyle  . Physical activity:    Days per week: Not on file    Minutes per session: Not on file  . Stress: Not on file  Relationships  . Social connections:    Talks on phone: Not on file    Gets together: Not on file    Attends religious service: Not on file    Active member of club or organization: Not on file    Attends meetings  of clubs or organizations: Not on file    Relationship status: Not on file  . Intimate partner violence:    Fear of current or ex partner: Not on file    Emotionally abused: Not on file    Physically abused: Not on file    Forced sexual activity: Not on file  Other Topics Concern  . Not on file  Social History Narrative   Lives at home with husband.   Right-handed.   2-3 cups caffeine daily.   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  PHYSICAL EXAMNIATION:  Gen: NAD, conversant, well nourised, obese, well groomed                     Cardiovascular: Regular rate rhythm, no peripheral edema, warm, nontender. Eyes: Conjunctivae clear without exudates or hemorrhage Neck: Supple, no carotid bruise. Pulmonary: Clear to auscultation bilaterally   NEUROLOGICAL EXAM:  MENTAL STATUS: Speech:    Speech is normal; fluent and spontaneous with normal comprehension.  Cognition:     Orientation to time, place and person     Normal recent and remote memory     Normal Attention span and concentration     Normal Language, naming, repeating,spontaneous speech     Fund of knowledge   CRANIAL NERVES: CN II: Visual fields are full to confrontation. Fundoscopic exam is normal with sharp discs and no vascular changes. Pupils are round equal and briskly reactive to light. CN III, IV, VI: extraocular movement are normal. No ptosis. CN V: Facial sensation is intact to pinprick in all 3 divisions bilaterally. Corneal responses are intact.  CN VII: Face is symmetric with normal eye closure and smile. CN VIII: Hearing is normal to rubbing fingers CN IX, X: Palate elevates symmetrically. Phonation is normal. CN XI: Head  turning and shoulder shrug are intact CN XII: Tongue is midline with normal movements and no atrophy.  MOTOR: There is no pronator drift of out-stretched arms. Muscle bulk and tone are normal. Muscle strength is normal.  REFLEXES: Reflexes are 2+ and symmetric at the biceps, triceps, knees, and ankles. Plantar responses are flexor.  SENSORY: Intact to light touch, pinprick, positional sensation and vibratory sensation are intact in fingers and toes.  COORDINATION: Rapid alternating movements and fine finger movements are intact. There is no dysmetria on finger-to-nose and heel-knee-shin.    GAIT/STANCE: Posture is normal. Gait is steady with normal steps, base, arm swing, and turning. Heel and toe walking are normal. Tandem gait is normal.  Romberg is absent.   DIAGNOSTIC DATA (LABS, IMAGING, TESTING) - I reviewed patient records, labs, notes, testing and imaging myself where available.   ASSESSMENT AND PLAN  [name] is a 52 y.o. female   Chronic migraine  nortriptyline 59m titrating to 313mqhs as migraine prevention    Continue magnesium oxide 400 mg twice a day, riboflavin 100 mg twice a day as migraine prevention  Maxalt 10 mg as needed, Imitrex subcutaneous as needed for more severe headaches  May mix with Zofran 4 mg as needed, tizanidine, Aleve, as needed  Return to clinic with Dr. AhJaynee Eaglesn 3 months   YiMarcial PacasM.D. Ph.D.  [facility] [id number] [id number]: [phone]: [phone]: JoArvella NighMD

## 2018-03-16 NOTE — Patient Instructions (Addendum)
You may take   Maxalt or imitrex as needed at the onset of headache  Mixed with zofran for nauseous Tizanidine for muscle relaxant Aleve 1-2 tabs as needed  Benadryl is needed for sleep  CGRP antagonist Ajovy

## 2018-03-26 ENCOUNTER — Ambulatory Visit (INDEPENDENT_AMBULATORY_CARE_PROVIDER_SITE_OTHER): Payer: BC Managed Care – PPO | Admitting: Family

## 2018-03-26 ENCOUNTER — Encounter: Payer: Self-pay | Admitting: Family

## 2018-03-26 VITALS — BP 101/58 | HR 108 | Temp 98.0°F | Resp 18 | Ht 67.25 in | Wt 220.8 lb

## 2018-03-26 DIAGNOSIS — G43909 Migraine, unspecified, not intractable, without status migrainosus: Secondary | ICD-10-CM

## 2018-03-26 DIAGNOSIS — E66811 Obesity, class 1: Secondary | ICD-10-CM

## 2018-03-26 DIAGNOSIS — E669 Obesity, unspecified: Secondary | ICD-10-CM | POA: Diagnosis not present

## 2018-03-26 DIAGNOSIS — F329 Major depressive disorder, single episode, unspecified: Secondary | ICD-10-CM | POA: Diagnosis not present

## 2018-03-26 DIAGNOSIS — F32A Depression, unspecified: Secondary | ICD-10-CM

## 2018-03-26 MED ORDER — ESCITALOPRAM OXALATE 10 MG PO TABS: 10.0000 mg | ORAL_TABLET | Freq: Every day | ORAL | 2 refills | Status: DC

## 2018-03-26 NOTE — Progress Notes (Signed)
Subjective:    Patient ID: [name], female    DOB: 09/02/1965, 52 y.o.   MRN: [id number]  HPI  [name] is a 52 yr old female who presents today for follow up.  Depression- Last visit she noted ongoing depression symptoms. We added lexapro 66m once daily.  Reports that she feels less overwhelmed with burden.    Migraines-  She saw Dr. YKrista Wilkins 8/20 (neurology).  Nortriptyline was added to her regimen along with mag ox and riboflavin and prn zanaflex/zofran. She reports that she had a migraine yesterday and took cocktail but found her self "knocked out" from it.  Wonders if it is the muscle relaxer that did it.    Weight gain- last visit we checked her tsh which was WNL.  Her weight has been stable of the last month or so.   Wt Readings from Last 3 Encounters:  03/26/18 220 lb 12.8 oz (100.2 kg)  03/16/18 218 lb (98.9 kg)  02/10/18 221 lb (100.2 kg)   Lab Results  Component Value Date   TSH 1.97 02/10/2018       Review of Systems See HPI  Past Medical History:  Diagnosis Date  . Abrasion of right arm 12/25/2016  . Complication of anesthesia    panic attack while waking up  . History of breast cancer 2014  . Hypothyroidism   . Migraines   . PONV (postoperative nausea and vomiting)      Social History   Socioeconomic History  . Marital status: Married    Spouse name: [name] . Number of children: 0  . Years of education: Masters  . Highest education level: Not on file  Occupational History  . Occupation: Medical Records  Social Needs  . Financial resource strain: Not on file  . Food insecurity:    Worry: Not on file    Inability: Not on file  . Transportation needs:    Medical: Not on file    Non-medical: Not on file  Tobacco Use  . Smoking status: Never Smoker  . Smokeless tobacco: Never Used  Substance and Sexual Activity  . Alcohol use: No    Alcohol/week: 0.0 standard drinks  . Drug use: No  . Sexual activity: Yes  Lifestyle  . Physical  activity:    Days per week: Not on file    Minutes per session: Not on file  . Stress: Not on file  Relationships  . Social connections:    Talks on phone: Not on file    Gets together: Not on file    Attends religious service: Not on file    Active member of club or organization: Not on file    Attends meetings of clubs or organizations: Not on file    Relationship status: Not on file  . Intimate partner violence:    Fear of current or ex partner: Not on file    Emotionally abused: Not on file    Physically abused: Not on file    Forced sexual activity: Not on file  Other Topics Concern  . Not on file  Social History Narrative   Lives at home with husband.   Right-handed.   2-3 cups caffeine daily.   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 BREAST LUMPECTOMY WITH NEEDLE LOCALIZATION Left 06/20/2013   Procedure:  BREAST LUMPECTOMY WITH NEEDLE LOCALIZATION;  Surgeon: Stephanie Roof, MD;  Location: [facility];  Service: General;  Laterality: Left;  . BREAST RECONSTRUCTION Left 04/09/2017   Procedure: REVISION BREAST RECONSTRUCTION;  Surgeon: Stephanie Going, DO;  Location: [facility];  Service: Plastics;  Laterality: Left;  . LAPAROSCOPIC VAGINAL HYSTERECTOMY WITH SALPINGO OOPHORECTOMY Bilateral 06/17/2016   Procedure: LAPAROSCOPIC ASSISTED VAGINAL HYSTERECTOMY WITH SALPINGO OOPHORECTOMY;  Surgeon: Stephanie Nigh, MD;  Location: [facility];  Service: Gynecology;  Laterality: Bilateral;  need bed  . LIPOSUCTION WITH LIPOFILLING Left 04/09/2017   Procedure: LIPOFILLING TO THE LEFT UPPER BREAST TO IMPROVE SYMMETRY.;  Surgeon: Stephanie Going, DO;  Location: [facility];  Service: Plastics;  Laterality: Left;  [name] Kitchen MASTECTOMY W/ SENTINEL NODE BIOPSY Bilateral 09/08/2016   Procedure:  LEFT MASTECTOMY WITH LEFT SENTINEL LYMPH NODE BIOPSY, RIGHT PROPHYLACTIC MASTECTOMY;  Surgeon: Stephanie Messing III, MD;  Location: [facility];  Service: General;  Laterality: Bilateral;  . PALATE TO GINGIVA GRAFT    . REMOVAL OF BILATERAL TISSUE EXPANDERS WITH PLACEMENT OF BILATERAL BREAST IMPLANTS Bilateral 01/01/2017   Procedure: REMOVAL OF BILATERAL TISSUE EXPANDERS WITH PLACEMENT OF BILATERAL SILICONE BREAST IMPLANTS WITH LIPOSUCTION;  Surgeon: Stephanie Going, DO;  Location: [facility];  Service: Plastics;  Laterality: Bilateral;  . TISSUE EXPANDER PLACEMENT Bilateral 09/08/2016   Procedure: IMMEDIATE PLACEMENT OF BILATERAL TISSUE EXPANDERS AFTER MASTECTOMIES;  Surgeon: Stephanie Lofty Dillingham, DO;  Location: [facility];  Service: Plastics;  Laterality: Bilateral;    Family History  Problem Relation Age of Onset  . Breast cancer Mother 7  . Heart disease Mother        Atrial fibrillation  . Melanoma Father 39  . COPD Father   . Arthritis Father   . Breast cancer Sister 68       ER+/her2-; onco score = 8  . Cancer Maternal Aunt        oral cancer; smoker  . Head & neck cancer Paternal Uncle        smoker  . Breast cancer Cousin        dx in her late 39s-60s    Allergies  Allergen Reactions  . Penicillins Rash    Current Outpatient Medications on File Prior to Visit  Medication Sig Dispense Refill  . Calcium Carbonate-Vitamin D (CALCIUM + D PO) Take 2 tablets by mouth daily.     . cholecalciferol (VITAMIN D) 1000 units tablet Take 1,000 Units by mouth daily.     [name] Kitchen KRILL OIL OMEGA-3 PO Take 1 tablet by mouth daily.     [name] Kitchen levothyroxine (SYNTHROID, LEVOTHROID) 125 MCG tablet Take 125 mcg by mouth daily before breakfast.    . magnesium oxide (MAG-OX) 400 MG tablet Take 400 mg by mouth daily.    . Multiple Vitamin (MULTIVITAMIN) tablet Take 1 tablet by mouth daily.    . nortriptyline (PAMELOR) 10 MG capsule Take 3 capsules (30 mg total) by mouth at bedtime. 90 capsule 11  .  ondansetron (ZOFRAN ODT) 4 MG disintegrating tablet Take 1 tablet (4 mg total) by mouth every 8 (eight) hours as needed. 20 tablet 6  . pantoprazole (PROTONIX) 40 MG tablet TAKE 1 TABLET(40 MG) BY MOUTH TWICE DAILY 60 tablet 0  . Riboflavin 400 MG TABS Take by mouth.    . rizatriptan (MAXALT-MLT) 10 MG disintegrating tablet Take 1 tablet (10 mg total) by mouth as needed for migraine. May repeat in 2 hours  if needed 15 tablet 11  . SUMAtriptan 6 MG/0.5ML SOAJ Inject 6 mg into the skin as needed. 12 Cartridge 11  . tiZANidine (ZANAFLEX) 4 MG tablet Take 1 tablet (4 mg total) by mouth every 6 (six) hours as needed for muscle spasms. 30 tablet 6  . vitamin E (VITAMIN E) 1000 UNIT capsule Take 1,000 Units by mouth daily.     No current facility-administered medications on file prior to visit.     BP (!) 101/58 (BP Location: Right Arm, Cuff Size: Large)   Pulse (!) 108   Temp 98 F (36.7 C) (Oral)   Resp 18   Ht 5' 7.25" (1.708 m)   Wt 220 lb 12.8 oz (100.2 kg)   LMP 02/01/2014   SpO2 98%   BMI 34.33 kg/m       Objective:   Physical Exam  Constitutional: She is oriented to person, place, and time. She appears well-developed and well-nourished.  Cardiovascular: Normal rate, regular rhythm and normal heart sounds.  No murmur heard. Pulmonary/Chest: Effort normal and breath sounds normal. No respiratory distress. She has no wheezes.  Neurological: She is alert and oriented to person, place, and time.  Skin: Skin is warm and dry.  Psychiatric: She has a normal mood and affect. Her behavior is normal. Judgment and thought content normal.          Assessment & Plan:  Depression- scored 11 phq-9.  Improving on lexapro 30m,  but will increase to 140m   Migraines- some improvement on cocktail per neuro. She found that it made her really drowsy though- so we discussed cutting zanaflex in half to see if she better tolerates.  Obesity- reports that she is not currently in the mindset to  lose weight due to stress.  We discussed trying to add some exercise and at least work on maintenance until she is better able to motivate/focus on weight loss.

## 2018-03-26 NOTE — Patient Instructions (Signed)
Please increase lexapro to 10mg once daily.  

## 2018-03-30 ENCOUNTER — Other Ambulatory Visit: Payer: Self-pay | Admitting: Family

## 2018-04-05 ENCOUNTER — Telehealth: Payer: Self-pay | Admitting: Family

## 2018-04-05 MED ORDER — ESCITALOPRAM OXALATE 10 MG PO TABS: 10.0000 mg | ORAL_TABLET | Freq: Every day | ORAL | 2 refills | Status: DC

## 2018-04-05 NOTE — Telephone Encounter (Signed)
Spoke with [facility] and they state they did not receive the RX dated 03/26/18 from [place]. Pharmacist was given a verbal per previous Rx and pt has been notified.

## 2018-04-05 NOTE — Telephone Encounter (Signed)
Copied from [facility] [id number]. Topic: Quick Communication - See Telephone Encounter >> Apr 05, 2018  9:38 AM Rutherford Nail, NT wrote: CRM for notification. See Telephone encounter for: 04/05/18. Patient calling and states that the pharmacy is stating that they have not received the escitalopram (LEXAPRO) 10 MG tablet. Please advise. [id number] - [place], [place] - [address] AT [place]

## 2018-04-12 ENCOUNTER — Encounter: Payer: Self-pay | Admitting: Physical Therapy

## 2018-04-12 ENCOUNTER — Other Ambulatory Visit: Payer: Self-pay

## 2018-04-12 ENCOUNTER — Ambulatory Visit: Payer: BC Managed Care – PPO | Attending: Hematology & Oncology | Admitting: Physical Therapy

## 2018-04-12 DIAGNOSIS — M25512 Pain in left shoulder: Secondary | ICD-10-CM | POA: Diagnosis present

## 2018-04-12 DIAGNOSIS — M25611 Stiffness of right shoulder, not elsewhere classified: Secondary | ICD-10-CM | POA: Diagnosis present

## 2018-04-12 DIAGNOSIS — M25612 Stiffness of left shoulder, not elsewhere classified: Secondary | ICD-10-CM | POA: Insufficient documentation

## 2018-04-12 DIAGNOSIS — M25511 Pain in right shoulder: Secondary | ICD-10-CM | POA: Diagnosis not present

## 2018-04-12 DIAGNOSIS — R29898 Other symptoms and signs involving the musculoskeletal system: Secondary | ICD-10-CM | POA: Diagnosis present

## 2018-04-12 DIAGNOSIS — G8929 Other chronic pain: Secondary | ICD-10-CM | POA: Insufficient documentation

## 2018-04-12 NOTE — Therapy (Signed)
[facility] [address]  [place], [place], [address] Phone: [phone]   Fax:  [phone]  Physical Therapy Evaluation  Patient Details  Name: [name] MRN: [id number] Date of Birth: 09-23-1965 Referring Provider: Burney Gauze, MD   Encounter Date: 04/12/2018  PT End of Session - 04/12/18 1801    Visit Number  1    Number of Visits  13    Date for PT Re-Evaluation  05/24/18    Authorization Type  State and Federal BCBS    PT Start Time  1652    PT Stop Time  1741    PT Time Calculation (min)  49 min    Activity Tolerance  Patient tolerated treatment well;Patient limited by pain    Behavior During Therapy  [facility] for tasks assessed/performed       Past Medical History:  Diagnosis Date  . Abrasion of right arm 12/25/2016  . Complication of anesthesia    panic attack while waking up  . History of breast cancer 2014  . Hypothyroidism   . Migraines   . PONV (postoperative nausea and vomiting)     Past Surgical History:  Procedure Laterality Date  . BREAST LUMPECTOMY WITH NEEDLE LOCALIZATION Left 06/20/2013   Procedure: BREAST LUMPECTOMY WITH NEEDLE LOCALIZATION;  Surgeon: Merrie Roof, MD;  Location: [facility];  Service: General;  Laterality: Left;  . BREAST RECONSTRUCTION Left 04/09/2017   Procedure: REVISION BREAST RECONSTRUCTION;  Surgeon: Wallace Going, DO;  Location: [facility];  Service: Plastics;  Laterality: Left;  . LAPAROSCOPIC VAGINAL HYSTERECTOMY WITH SALPINGO OOPHORECTOMY Bilateral 06/17/2016   Procedure: LAPAROSCOPIC ASSISTED VAGINAL HYSTERECTOMY WITH SALPINGO OOPHORECTOMY;  Surgeon: Arvella Nigh, MD;  Location: [facility];  Service: Gynecology;  Laterality: Bilateral;  need bed  . LIPOSUCTION WITH LIPOFILLING Left 04/09/2017   Procedure: LIPOFILLING TO THE LEFT UPPER BREAST TO IMPROVE SYMMETRY.;  Surgeon: Wallace Going, DO;   Location: [facility];  Service: Plastics;  Laterality: Left;  Marland Kitchen MASTECTOMY W/ SENTINEL NODE BIOPSY Bilateral 09/08/2016   Procedure: LEFT MASTECTOMY WITH LEFT SENTINEL LYMPH NODE BIOPSY, RIGHT PROPHYLACTIC MASTECTOMY;  Surgeon: Autumn Messing III, MD;  Location: [facility];  Service: General;  Laterality: Bilateral;  . PALATE TO GINGIVA GRAFT    . REMOVAL OF BILATERAL TISSUE EXPANDERS WITH PLACEMENT OF BILATERAL BREAST IMPLANTS Bilateral 01/01/2017   Procedure: REMOVAL OF BILATERAL TISSUE EXPANDERS WITH PLACEMENT OF BILATERAL SILICONE BREAST IMPLANTS WITH LIPOSUCTION;  Surgeon: Wallace Going, DO;  Location: [facility];  Service: Plastics;  Laterality: Bilateral;  . TISSUE EXPANDER PLACEMENT Bilateral 09/08/2016   Procedure: IMMEDIATE PLACEMENT OF BILATERAL TISSUE EXPANDERS AFTER MASTECTOMIES;  Surgeon: Loel Lofty Dillingham, DO;  Location: [facility];  Service: Plastics;  Laterality: Bilateral;    There were no vitals filed for this visit.   Subjective Assessment - 04/12/18 1654    Subjective  Patient reports she had B mastectomies in February 2018 following breast CA diagnosis. Spacers placed at time of mastectomy with gradual fill in up until July 2018. Implant placement in either July or August 2018. Lipo-filling several months after that. Patient with only surgical removal, no chemo or radiation. Denies any precautions post-surgery. Current complaints include pain along B lats, below B breasts, and along L pec. Denies trouble with lymphedema. Pain comes and goes but has not found a pattern to the pain. Feels a bit better with massage. Reports rope  of tissue underneath B armpits- R > L. Pain also located in back along bra strap. Patient cares for father who is immobile- reports B hand N/T and L LBP from transferring and rolling as a caregiver.     Pertinent History  migraines, hypothyroidism, hx of breast CA, B mastectomies with reconstruction and implant placement, abrasion of R  arm    Limitations  House hold activities    Patient Stated Goals  would like to get more ROM back and get rid of scar tissue if possible    Currently in Pain?  No/denies    Pain Location  Axilla    Pain Orientation  Left;Right    Pain Descriptors / Indicators  Tingling   numb but painful   Pain Type  Chronic pain;Surgical pain;Neuropathic pain    Pain Radiating Towards  starting in B axillae, radiating to back and below breasts    Aggravating Factors   inconsistent    Pain Relieving Factors  massage         [facility] PT Assessment - 04/12/18 0001      Assessment   Medical Diagnosis  Chronic chest wall pain    Referring Provider  Burney Gauze, MD    Onset Date/Surgical Date  --   Feb 2018   Hand Dominance  Right    Next MD Visit  08/02/2018    Prior Therapy  No      Precautions   Precautions  Other (comment)   Hx of breast CA     Restrictions   Weight Bearing Restrictions  No      Balance Screen   Has the patient fallen in the past 6 months  No    Has the patient had a decrease in activity level because of a fear of falling?   No    Is the patient reluctant to leave their home because of a fear of falling?   No      Home Film/video editor residence    Living Arrangements  Spouse/significant other    Type of [place]      Prior Function   Level of Independence  Independent      Cognition   Overall Cognitive Status  Within Functional Limits for tasks assessed      Sensation   Light Touch  Appears Intact   B N/T in hands- caring for immobile father     Coordination   Gross Motor Movements are Fluid and Coordinated  Yes      Posture/Postural Control   Posture/Postural Control  Postural limitations    Postural Limitations  Rounded Shoulders;Forward head      ROM / Strength   AROM / PROM / Strength  AROM;PROM;Strength      AROM   AROM Assessment Site  Shoulder    Right/Left Shoulder  Right;Left    Right Shoulder Flexion  135 Degrees    R axilla pain   Right Shoulder ABduction  116 Degrees   R axilla pain   Right Shoulder Internal Rotation  35 Degrees   R shoulder pain   Right Shoulder External Rotation  105 Degrees   R shoulder pain   Left Shoulder Flexion  125 Degrees   L axilla pain   Left Shoulder ABduction  123 Degrees   L axilla pain   Left Shoulder Internal Rotation  50 Degrees   L shoulder pain   Left Shoulder External Rotation  92 Degrees  L shoulder pain     PROM   PROM Assessment Site  Shoulder   limited by pain in shoulder   Right/Left Shoulder  Right;Left    Right Shoulder Flexion  154 Degrees    Right Shoulder ABduction  120 Degrees    Right Shoulder Internal Rotation  91 Degrees    Right Shoulder External Rotation  70 Degrees    Left Shoulder Flexion  153 Degrees    Left Shoulder ABduction  140 Degrees    Left Shoulder Internal Rotation  95 Degrees    Left Shoulder External Rotation  59 Degrees      Strength   Strength Assessment Site  Shoulder    Right/Left Shoulder  Right;Left    Right Shoulder Flexion  4/5    Right Shoulder ABduction  4/5    Right Shoulder Internal Rotation  4-/5    Right Shoulder External Rotation  4-/5    Left Shoulder Flexion  4/5    Left Shoulder ABduction  4/5    Left Shoulder Internal Rotation  4-/5   pain in L shoulder and axilla   Left Shoulder External Rotation  4-/5      Flexibility   Soft Tissue Assessment /Muscle Length  yes   B pecs and lats mod-severe tightness     Palpation   Palpation comment  TTP over B lats, pecs                Objective measurements completed on examination: See above findings.              PT Education - 04/12/18 1800    Education Details  prognosis, POC, HEP, edu on desensitization techniques    Person(s) Educated  Patient    Methods  Explanation;Demonstration;Verbal cues;Tactile cues;Handout    Comprehension  Verbalized understanding;Returned demonstration       PT Short Term Goals - 04/12/18  1805      PT SHORT TERM GOAL #1   Title  Patient to be independent with initial HEP.    Time  3    Period  Weeks    Status  New    Target Date  05/03/18        PT Long Term Goals - 04/12/18 1805      PT LONG TERM GOAL #1   Title  Patient to be independent with advanced HEP.    Time  6    Period  Weeks    Status  New    Target Date  05/24/18      PT LONG TERM GOAL #2   Title  Patient to demonstrate B shoulder AROM/PROM [facility] and without pain limiting.     Time  6    Period  Weeks    Status  New    Target Date  05/24/18      PT LONG TERM GOAL #3   Title  Patient to demonstrate B shoulder strength >=4+/5.     Time  6    Period  Weeks    Status  New    Target Date  05/24/18      PT LONG TERM GOAL #4   Title  Patient to demontrate WNL to mild tightness in B pec and lats.    Time  6    Period  Weeks    Status  New    Target Date  05/24/18      PT LONG TERM GOAL #5   Title  Patient to demonstrate  proper lifting mechanics to decrease risk of injury.     Time  6    Period  Weeks    Status  New    Target Date  05/24/18             Plan - 04/12/18 1802    Clinical Impression Statement  Patient is a 52y/o F presenting to OPPT s/p B mastectomies Feb 2018 with eventual implant placement later that year. Patient reports pain in B lats, below breasts, along L pec, and in thoracic spine along bra strap. N/T sensation present along bra strap line. Patient also reporting B tingling in hands and LBP from caregiving from father who is immobile. Patient today with painful and limited B shoulder ROM, decreased UE strength, decreased B pec and lats flexibility, and TTP over B pec and lats. Educated on gentle stretching and AAROM HEP and advised not to push into pain. Patient reported understanding. Advised patient to perform desensitizing technique along sensitive areas with cloth. Plan to educate patient on transfer techniques to ease caregiver burden in future visits. Patient would  benefit from skilled PT services 2x/week for 6 weeks to address aforementioned impairments.     Clinical Presentation  Stable    Clinical Decision Making  Low    Rehab Potential  Good    Clinical Impairments Affecting Rehab Potential  migraines, hypothyroidism, hx of breast CA, B mastectomies with reconstruction and implant placement, abrasion of R arm    PT Frequency  2x / week    PT Duration  6 weeks    PT Treatment/Interventions  ADLs/Self Care Home Management;Cryotherapy;Moist Heat;Functional mobility training;Therapeutic activities;Therapeutic exercise;Manual techniques;Patient/family education;Manual lymph drainage;Compression bandaging;Scar mobilization;Passive range of motion;Dry needling;Energy conservation;Splinting;Taping    PT Next Visit Plan  reassess HEP, address lifting techniques    Consulted and Agree with Plan of Care  Patient       Patient will benefit from skilled therapeutic intervention in order to improve the following deficits and impairments:  Impaired sensation, Decreased scar mobility, Decreased strength, Pain, Impaired UE functional use, Increased fascial restricitons, Decreased range of motion, Improper body mechanics, Postural dysfunction, Impaired flexibility  Visit Diagnosis: Chronic right shoulder pain  Chronic left shoulder pain  Stiffness of right shoulder, not elsewhere classified  Stiffness of left shoulder, not elsewhere classified  Other symptoms and signs involving the musculoskeletal system     Problem List Patient Active Problem List   Diagnosis Date Noted  . Gastroesophageal reflux disease 12/31/2017  . Hypothyroidism 12/31/2017  . History of breast cancer 12/31/2017  . Hot flashes 12/31/2017  . Depression 12/31/2017  . Other osteoporosis without current pathological fracture 01/29/2017  . Acquired absence of bilateral breasts and nipples 09/23/2016  . Breast cancer ([place]) 09/08/2016  . 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 S/P laparoscopic assisted vaginal hysterectomy (LAVH) 06/17/2016  . Chronic migraine 12/10/2015  . Chest pain 02/10/2014  . Myalgia 02/10/2014  . Hot flashes due to tamoxifen 02/10/2014  . Neoplasm of left breast, primary tumor staging category Tis 05/19/2013     Janene Harvey, PT, DPT 04/12/18 6:11 PM   [facility] [address]  [place], [place], [address] Phone: [phone]   Fax:  [phone]  Name: [name] MRN: [id number] Date of Birth: 31-Oct-1965

## 2018-04-26 ENCOUNTER — Ambulatory Visit: Payer: BC Managed Care – PPO | Admitting: Physical Therapy

## 2018-04-26 ENCOUNTER — Encounter: Payer: Self-pay | Admitting: Physical Therapy

## 2018-04-26 DIAGNOSIS — R29898 Other symptoms and signs involving the musculoskeletal system: Secondary | ICD-10-CM

## 2018-04-26 DIAGNOSIS — M25512 Pain in left shoulder: Secondary | ICD-10-CM

## 2018-04-26 DIAGNOSIS — M25511 Pain in right shoulder: Secondary | ICD-10-CM | POA: Diagnosis not present

## 2018-04-26 DIAGNOSIS — M25611 Stiffness of right shoulder, not elsewhere classified: Secondary | ICD-10-CM

## 2018-04-26 DIAGNOSIS — G8929 Other chronic pain: Secondary | ICD-10-CM

## 2018-04-26 DIAGNOSIS — M25612 Stiffness of left shoulder, not elsewhere classified: Secondary | ICD-10-CM

## 2018-04-26 NOTE — Therapy (Signed)
[facility] [address]  [place], [place], [address] Phone: [phone]   Fax:  [phone]  Physical Therapy Treatment  Patient Details  Name: [name] MRN: [id number] Date of Birth: 18-Nov-1965 Referring Provider (PT): Burney Gauze, MD   Encounter Date: 04/26/2018  PT End of Session - 04/26/18 1801    Visit Number  2    Number of Visits  13    Date for PT Re-Evaluation  05/24/18    Authorization Type  State and Federal [employer]    PT Start Time  1705    PT Stop Time  1750    PT Time Calculation (min)  45 min    Activity Tolerance  Patient tolerated treatment well;Patient limited by pain    Behavior During Therapy  [facility] for tasks assessed/performed       Past Medical History:  Diagnosis Date  . Abrasion of right arm 12/25/2016  . Complication of anesthesia    panic attack while waking up  . History of breast cancer 2014  . Hypothyroidism   . Migraines   . PONV (postoperative nausea and vomiting)     Past Surgical History:  Procedure Laterality Date  . BREAST LUMPECTOMY WITH NEEDLE LOCALIZATION Left 06/20/2013   Procedure: BREAST LUMPECTOMY WITH NEEDLE LOCALIZATION;  Surgeon: Merrie Roof, MD;  Location: [place];  Service: General;  Laterality: Left;  . BREAST RECONSTRUCTION Left 04/09/2017   Procedure: REVISION BREAST RECONSTRUCTION;  Surgeon: Wallace Going, DO;  Location: [place];  Service: Plastics;  Laterality: Left;  . LAPAROSCOPIC VAGINAL HYSTERECTOMY WITH SALPINGO OOPHORECTOMY Bilateral 06/17/2016   Procedure: LAPAROSCOPIC ASSISTED VAGINAL HYSTERECTOMY WITH SALPINGO OOPHORECTOMY;  Surgeon: Arvella Nigh, MD;  Location: [place];  Service: Gynecology;  Laterality: Bilateral;  need bed  . LIPOSUCTION WITH LIPOFILLING Left 04/09/2017   Procedure: LIPOFILLING TO THE LEFT UPPER BREAST TO IMPROVE SYMMETRY.;  Surgeon: Wallace Going, DO;   Location: [place];  Service: Plastics;  Laterality: Left;  Marland Kitchen MASTECTOMY W/ SENTINEL NODE BIOPSY Bilateral 09/08/2016   Procedure: LEFT MASTECTOMY WITH LEFT SENTINEL LYMPH NODE BIOPSY, RIGHT PROPHYLACTIC MASTECTOMY;  Surgeon: Autumn Messing III, MD;  Location: [place];  Service: General;  Laterality: Bilateral;  . PALATE TO GINGIVA GRAFT    . REMOVAL OF BILATERAL TISSUE EXPANDERS WITH PLACEMENT OF BILATERAL BREAST IMPLANTS Bilateral 01/01/2017   Procedure: REMOVAL OF BILATERAL TISSUE EXPANDERS WITH PLACEMENT OF BILATERAL SILICONE BREAST IMPLANTS WITH LIPOSUCTION;  Surgeon: Wallace Going, DO;  Location: [place];  Service: Plastics;  Laterality: Bilateral;  . TISSUE EXPANDER PLACEMENT Bilateral 09/08/2016   Procedure: IMMEDIATE PLACEMENT OF BILATERAL TISSUE EXPANDERS AFTER MASTECTOMIES;  Surgeon: Loel Lofty Dillingham, DO;  Location: [place];  Service: Plastics;  Laterality: Bilateral;    There were no vitals filed for this visit.  Subjective Assessment - 04/26/18 1706    Subjective  Patient reports she has been okay since last session- reports when she does some of the exercises it feels like the L implant is slipping but unsure if it is just muscle. Has had this feeling before when she stretches in a certain way.     Pertinent History  migraines, hypothyroidism, hx of breast CA, B mastectomies with reconstruction and implant placement, abrasion of R arm    Patient Stated Goals  would like to get more ROM back and get rid of scar tissue if possible  Currently in Pain?  No/denies                       [facility] Adult PT Treatment/Exercise - 04/26/18 0001      Exercises   Exercises  Shoulder      Shoulder Exercises: Supine   Flexion  AAROM;Right;Left;5 reps;Limitations    Flexion Limitations  with wand to tolerance- c/o discomfort on L    ABduction  Right;Left;AAROM;5 reps    ABduction Limitations  with wand to tolerance      Shoulder Exercises:  Standing   Row  Strengthening;Both;10 reps;Theraband    Theraband Level (Shoulder Row)  Level 2 (Red)    Row Limitations  cues for scap retraction    Other Standing Exercises  Edu and practice for self- STM along L thoracic paraspinals and rhomboids      Shoulder Exercises: Pulleys   Flexion  3 minutes    Flexion Limitations  to tolerance    Scaption  3 minutes    Scaption Limitations  to tolerance      Shoulder Exercises: Stretch   Corner Stretch  1 rep;30 seconds;Limitations    Corner Stretch Limitations  90/90 at door to tolerance    Other Shoulder Stretches  lat stretch against wall x30 each side      Manual Therapy   Manual Therapy  Soft tissue mobilization;Myofascial release;Passive ROM    Soft tissue mobilization  B rhomboids, thoracic paraspinals, LS- TTP and soft tissue restriction- worse on L    Myofascial Release  TPR in B rhomboids and lats under axillae; gentle myofascial release along B lats and under axillae    Passive ROM  B shoulder ABD to tolerance in sidelying- pt reporting pain along ribcage with end range ABD             PT Education - 04/26/18 1800    Education Details  update to HEP and administered red TB; edu on use of moist heat to shoulder musculature before performing stretching activities; self-massage with ball on wall    Person(s) Educated  Patient    Methods  Explanation;Demonstration;Tactile cues;Handout    Comprehension  Verbalized understanding;Returned demonstration       PT Short Term Goals - 04/26/18 1806      PT SHORT TERM GOAL #1   Title  Patient to be independent with initial HEP.    Time  3    Period  Weeks    Status  On-going        PT Long Term Goals - 04/26/18 1806      PT LONG TERM GOAL #1   Title  Patient to be independent with advanced HEP.    Time  6    Period  Weeks    Status  On-going      PT LONG TERM GOAL #2   Title  Patient to demonstrate B shoulder AROM/PROM WFL and without pain limiting.     Time  6     Period  Weeks    Status  On-going      PT LONG TERM GOAL #3   Title  Patient to demonstrate B shoulder strength >=4+/5.     Time  6    Period  Weeks    Status  On-going      PT LONG TERM GOAL #4   Title  Patient to demontrate WNL to mild tightness in B pec and lats.    Time  6  Period  Weeks    Status  On-going      PT LONG TERM GOAL #5   Title  Patient to demonstrate proper lifting mechanics to decrease risk of injury.     Time  6    Period  Weeks    Status  On-going            Plan - 04/26/18 1801    Clinical Impression Statement  Patient arrived to session with report of mild discomfort in L breast with shoulder flexion AAROM. Reports that she believes that implant is slipping and plans to ask her surgeon about this. Advised patient to perform this activity to tolerance and encouraged patient to consult MD. Patient tolerated gentle STM and trigger point release to musculature medial to B scapulae and under axillae to improve tissue pliability. Patient tolerated gentle PROM into abduction with mild c/o pain across anterior chest at end range which dissipated after weaning off. Reviewed AAROM and advised patient to decrease speed of movement to improve stretch. Updated HEP with additional exercises- patient reported understanding. Ended session without c/o pain.     Clinical Impairments Affecting Rehab Potential  migraines, hypothyroidism, hx of breast CA, B mastectomies with reconstruction and implant placement, abrasion of R arm    PT Treatment/Interventions  ADLs/Self Care Home Management;Cryotherapy;Moist Heat;Functional mobility training;Therapeutic activities;Therapeutic exercise;Manual techniques;Patient/family education;Manual lymph drainage;Compression bandaging;Scar mobilization;Passive range of motion;Dry needling;Energy conservation;Splinting;Taping    Consulted and Agree with Plan of Care  Patient       Patient will benefit from skilled therapeutic intervention  in order to improve the following deficits and impairments:  Impaired sensation, Decreased scar mobility, Decreased strength, Pain, Impaired UE functional use, Increased fascial restricitons, Decreased range of motion, Improper body mechanics, Postural dysfunction, Impaired flexibility  Visit Diagnosis: Chronic right shoulder pain  Chronic left shoulder pain  Stiffness of right shoulder, not elsewhere classified  Stiffness of left shoulder, not elsewhere classified  Other symptoms and signs involving the musculoskeletal system     Problem List Patient Active Problem List   Diagnosis Date Noted  . Gastroesophageal reflux disease 12/31/2017  . Hypothyroidism 12/31/2017  . History of breast cancer 12/31/2017  . Hot flashes 12/31/2017  . Depression 12/31/2017  . Other osteoporosis without current pathological fracture 01/29/2017  . Acquired absence of bilateral breasts and nipples 09/23/2016  . Breast cancer ([place]) 09/08/2016  . 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 S/P laparoscopic assisted vaginal hysterectomy (LAVH) 06/17/2016  . Chronic migraine 12/10/2015  . Chest pain 02/10/2014  . Myalgia 02/10/2014  . Hot flashes due to tamoxifen 02/10/2014  . Neoplasm of left breast, primary tumor staging category Tis 05/19/2013     Janene Harvey, PT, DPT 04/26/18 6:08 PM   [facility] [address]  [place], [place], [address] Phone: [phone]   Fax:  [phone]  Name: DEONI COSEY MRN: [id number] Date of Birth: 07/31/1965

## 2018-04-28 ENCOUNTER — Encounter: Payer: Self-pay | Admitting: Physical Therapy

## 2018-04-28 ENCOUNTER — Ambulatory Visit: Payer: BC Managed Care – PPO | Attending: Hematology & Oncology | Admitting: Physical Therapy

## 2018-04-28 DIAGNOSIS — R29898 Other symptoms and signs involving the musculoskeletal system: Secondary | ICD-10-CM | POA: Diagnosis present

## 2018-04-28 DIAGNOSIS — G8929 Other chronic pain: Secondary | ICD-10-CM | POA: Diagnosis present

## 2018-04-28 DIAGNOSIS — M25512 Pain in left shoulder: Secondary | ICD-10-CM | POA: Diagnosis present

## 2018-04-28 DIAGNOSIS — M25612 Stiffness of left shoulder, not elsewhere classified: Secondary | ICD-10-CM | POA: Diagnosis present

## 2018-04-28 DIAGNOSIS — M25511 Pain in right shoulder: Secondary | ICD-10-CM | POA: Insufficient documentation

## 2018-04-28 DIAGNOSIS — M25611 Stiffness of right shoulder, not elsewhere classified: Secondary | ICD-10-CM | POA: Diagnosis present

## 2018-04-28 NOTE — Patient Instructions (Signed)

## 2018-04-28 NOTE — Therapy (Signed)
[facility] [address]  [place], [place], [address] Phone: [phone]   Fax:  [phone]  Physical Therapy Treatment  Patient Details  Name: [name] MRN: [id number] Date of Birth: 01-Dec-1965 Referring Provider (PT): Burney Gauze, MD   Encounter Date: 04/28/2018  PT End of Session - 04/28/18 1754    Visit Number  3    Number of Visits  13    Date for PT Re-Evaluation  05/24/18    Authorization Type  State and [employer]    PT Start Time  1703    PT Stop Time  1747    PT Time Calculation (min)  44 min    Activity Tolerance  Patient tolerated treatment well;Patient limited by pain    Behavior During Therapy  [facility] for tasks assessed/performed       Past Medical History:  Diagnosis Date  . Abrasion of right arm 12/25/2016  . Complication of anesthesia    panic attack while waking up  . History of breast cancer 2014  . Hypothyroidism   . Migraines   . PONV (postoperative nausea and vomiting)     Past Surgical History:  Procedure Laterality Date  . BREAST LUMPECTOMY WITH NEEDLE LOCALIZATION Left 06/20/2013   Procedure: BREAST LUMPECTOMY WITH NEEDLE LOCALIZATION;  Surgeon: Merrie Roof, MD;  Location: [place];  Service: General;  Laterality: Left;  . BREAST RECONSTRUCTION Left 04/09/2017   Procedure: REVISION BREAST RECONSTRUCTION;  Surgeon: Wallace Going, DO;  Location: [place];  Service: Plastics;  Laterality: Left;  . LAPAROSCOPIC VAGINAL HYSTERECTOMY WITH SALPINGO OOPHORECTOMY Bilateral 06/17/2016   Procedure: LAPAROSCOPIC ASSISTED VAGINAL HYSTERECTOMY WITH SALPINGO OOPHORECTOMY;  Surgeon: Arvella Nigh, MD;  Location: [place];  Service: Gynecology;  Laterality: Bilateral;  need bed  . LIPOSUCTION WITH LIPOFILLING Left 04/09/2017   Procedure: LIPOFILLING TO THE LEFT UPPER BREAST TO IMPROVE SYMMETRY.;  Surgeon: Wallace Going, DO;   Location: [place];  Service: Plastics;  Laterality: Left;  Marland Kitchen MASTECTOMY W/ SENTINEL NODE BIOPSY Bilateral 09/08/2016   Procedure: LEFT MASTECTOMY WITH LEFT SENTINEL LYMPH NODE BIOPSY, RIGHT PROPHYLACTIC MASTECTOMY;  Surgeon: Autumn Messing III, MD;  Location: [place];  Service: General;  Laterality: Bilateral;  . PALATE TO GINGIVA GRAFT    . REMOVAL OF BILATERAL TISSUE EXPANDERS WITH PLACEMENT OF BILATERAL BREAST IMPLANTS Bilateral 01/01/2017   Procedure: REMOVAL OF BILATERAL TISSUE EXPANDERS WITH PLACEMENT OF BILATERAL SILICONE BREAST IMPLANTS WITH LIPOSUCTION;  Surgeon: Wallace Going, DO;  Location: [place];  Service: Plastics;  Laterality: Bilateral;  . TISSUE EXPANDER PLACEMENT Bilateral 09/08/2016   Procedure: IMMEDIATE PLACEMENT OF BILATERAL TISSUE EXPANDERS AFTER MASTECTOMIES;  Surgeon: Loel Lofty Dillingham, DO;  Location: [place];  Service: Plastics;  Laterality: Bilateral;    There were no vitals filed for this visit.  Subjective Assessment - 04/28/18 1704    Subjective  Reports she is feeling pretty good today. Reports compliance with HEP and no issues.     Pertinent History  migraines, hypothyroidism, hx of breast CA, B mastectomies with reconstruction and implant placement, abrasion of R arm    Patient Stated Goals  would like to get more ROM back and get rid of scar tissue if possible    Currently in Pain?  No/denies                       [facility] Adult  PT Treatment/Exercise - 04/28/18 0001      Therapeutic Activites    Therapeutic Activities  Lifting;Other Therapeutic Activities    Lifting  edu and practice lifting 10# box from grounnd with hip hinge, squat, staggered stance    Other Therapeutic Activities  edu on tranferring bed-ridden father to avoid back pain      Exercises   Exercises  Shoulder      Shoulder Exercises: Seated   Flexion  AAROM;Both;10 reps;Limitations    Flexion Limitations  to tolerance; with wand     Abduction  AAROM;10 reps;Limitations;Left;Right    ABduction Limitations  to tolerance; with wand    Other Seated Exercises  diaphragmatic breathing 5x 3 sec cycles with palpation of belly      Shoulder Exercises: Pulleys   Flexion  3 minutes    Flexion Limitations  to tolerance    Scaption  3 minutes    Scaption Limitations  to tolerance      Manual Therapy   Manual Therapy  Soft tissue mobilization;Myofascial release;Passive ROM    Soft tissue mobilization  B rhomboids, thoracic paraspinals, lats, pecs- most soft tissue restriction in pecs    Myofascial Release  TPR in B rhomboids, thoracic paraspinals, pecs, and lats under axillae- most restriction in pecs    Passive ROM  R&L shoulder ABD and horizontal ABD to tolerance in sidelying- pt reporting pain along ribcage with end range ABD             PT Education - 04/28/18 1754    Education Details  advised to perform shoulder AAROM seated to avoid c/o implant on L slipping; edu on safe lifting and transferring to avoid LBP    Person(s) Educated  Patient    Methods  Explanation;Demonstration;Tactile cues;Verbal cues    Comprehension  Verbalized understanding;Returned demonstration       PT Short Term Goals - 04/28/18 1759      PT SHORT TERM GOAL #1   Title  Patient to be independent with initial HEP.    Time  3    Period  Weeks    Status  Achieved        PT Long Term Goals - 04/26/18 1806      PT LONG TERM GOAL #1   Title  Patient to be independent with advanced HEP.    Time  6    Period  Weeks    Status  On-going      PT LONG TERM GOAL #2   Title  Patient to demonstrate B shoulder AROM/PROM WFL and without pain limiting.     Time  6    Period  Weeks    Status  On-going      PT LONG TERM GOAL #3   Title  Patient to demonstrate B shoulder strength >=4+/5.     Time  6    Period  Weeks    Status  On-going      PT LONG TERM GOAL #4   Title  Patient to demontrate WNL to mild tightness in B pec and lats.     Time  6    Period  Weeks    Status  On-going      PT LONG TERM GOAL #5   Title  Patient to demonstrate proper lifting mechanics to decrease risk of injury.     Time  6    Period  Weeks    Status  On-going  Plan - 04/28/18 1757    Clinical Impression Statement  Patient arrived to session with no new complaints. Reports compliance with HEP and no issues. Performed shoulder AAROM in sitting today to avoid c/o of L implant slipping- good tolerance and advised patient to perform this exercise in sitting at home. Spent time educating patient and practicing lifting 10 lb box to avoid straining back as well as education on safe transfer techniques as patient with questions on dressing and transferring her bed-ridden father. Ended session with STM to B lats, pecs, rhomboids, thoracic paraspinals with most restriction in B pecs. Gave patient DN info sheet as she may benefit from this modality in the future. Ended session without c/o pain.     Clinical Impairments Affecting Rehab Potential  migraines, hypothyroidism, hx of breast CA, B mastectomies with reconstruction and implant placement, abrasion of R arm    PT Treatment/Interventions  ADLs/Self Care Home Management;Cryotherapy;Moist Heat;Functional mobility training;Therapeutic activities;Therapeutic exercise;Manual techniques;Patient/family education;Manual lymph drainage;Compression bandaging;Scar mobilization;Passive range of motion;Dry needling;Energy conservation;Splinting;Taping    Consulted and Agree with Plan of Care  Patient       Patient will benefit from skilled therapeutic intervention in order to improve the following deficits and impairments:  Impaired sensation, Decreased scar mobility, Decreased strength, Pain, Impaired UE functional use, Increased fascial restricitons, Decreased range of motion, Improper body mechanics, Postural dysfunction, Impaired flexibility  Visit Diagnosis: Chronic right shoulder pain  Chronic  left shoulder pain  Stiffness of right shoulder, not elsewhere classified  Stiffness of left shoulder, not elsewhere classified  Other symptoms and signs involving the musculoskeletal system     Problem List Patient Active Problem List   Diagnosis Date Noted  . Gastroesophageal reflux disease 12/31/2017  . Hypothyroidism 12/31/2017  . History of breast cancer 12/31/2017  . Hot flashes 12/31/2017  . Depression 12/31/2017  . Other osteoporosis without current pathological fracture 01/29/2017  . Acquired absence of bilateral breasts and nipples 09/23/2016  . Breast cancer ([place]) 09/08/2016  . 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 S/P laparoscopic assisted vaginal hysterectomy (LAVH) 06/17/2016  . Chronic migraine 12/10/2015  . Chest pain 02/10/2014  . Myalgia 02/10/2014  . Hot flashes due to tamoxifen 02/10/2014  . Neoplasm of left breast, primary tumor staging category Tis 05/19/2013     Janene Harvey, PT, DPT 04/28/18 6:01 PM   [facility] [address]  [place], [place], [address] Phone: [phone]   Fax:  [phone]  Name: [name] MRN: [id number] Date of Birth: 03/07/1966

## 2018-05-03 ENCOUNTER — Ambulatory Visit: Payer: BC Managed Care – PPO | Admitting: Physical Therapy

## 2018-05-05 ENCOUNTER — Encounter: Payer: Self-pay | Admitting: Physical Therapy

## 2018-05-05 ENCOUNTER — Ambulatory Visit: Payer: BC Managed Care – PPO | Admitting: Physical Therapy

## 2018-05-05 DIAGNOSIS — R29898 Other symptoms and signs involving the musculoskeletal system: Secondary | ICD-10-CM

## 2018-05-05 DIAGNOSIS — M25611 Stiffness of right shoulder, not elsewhere classified: Secondary | ICD-10-CM

## 2018-05-05 DIAGNOSIS — M25511 Pain in right shoulder: Principal | ICD-10-CM

## 2018-05-05 DIAGNOSIS — M25612 Stiffness of left shoulder, not elsewhere classified: Secondary | ICD-10-CM

## 2018-05-05 DIAGNOSIS — G8929 Other chronic pain: Secondary | ICD-10-CM

## 2018-05-05 DIAGNOSIS — M25512 Pain in left shoulder: Secondary | ICD-10-CM

## 2018-05-05 NOTE — Therapy (Signed)
[facility] [address]  [place], [place], [address] Phone: [phone]   Fax:  [phone]  Physical Therapy Treatment  Patient Details  Name: [name] MRN: [id number] Date of Birth: 09/12/1965 Referring Provider (PT): Burney Gauze, MD   Encounter Date: 05/05/2018  PT End of Session - 05/05/18 1747    Visit Number  4    Number of Visits  13    Date for PT Re-Evaluation  05/24/18    Authorization Type  State and Federal BCBS    PT Start Time  1700    PT Stop Time  1745    PT Time Calculation (min)  45 min    Activity Tolerance  Patient tolerated treatment well    Behavior During Therapy  [facility] for tasks assessed/performed       Past Medical History:  Diagnosis Date  . Abrasion of right arm 12/25/2016  . Complication of anesthesia    panic attack while waking up  . History of breast cancer 2014  . Hypothyroidism   . Migraines   . PONV (postoperative nausea and vomiting)     Past Surgical History:  Procedure Laterality Date  . BREAST LUMPECTOMY WITH NEEDLE LOCALIZATION Left 06/20/2013   Procedure: BREAST LUMPECTOMY WITH NEEDLE LOCALIZATION;  Surgeon: Merrie Roof, MD;  Location: [place];  Service: General;  Laterality: Left;  . BREAST RECONSTRUCTION Left 04/09/2017   Procedure: REVISION BREAST RECONSTRUCTION;  Surgeon: Wallace Going, DO;  Location: [place];  Service: Plastics;  Laterality: Left;  . LAPAROSCOPIC VAGINAL HYSTERECTOMY WITH SALPINGO OOPHORECTOMY Bilateral 06/17/2016   Procedure: LAPAROSCOPIC ASSISTED VAGINAL HYSTERECTOMY WITH SALPINGO OOPHORECTOMY;  Surgeon: Arvella Nigh, MD;  Location: [place];  Service: Gynecology;  Laterality: Bilateral;  need bed  . LIPOSUCTION WITH LIPOFILLING Left 04/09/2017   Procedure: LIPOFILLING TO THE LEFT UPPER BREAST TO IMPROVE SYMMETRY.;  Surgeon: Wallace Going, DO;  Location: [place];  Service: Plastics;  Laterality: Left;  Marland Kitchen MASTECTOMY W/ SENTINEL NODE BIOPSY Bilateral 09/08/2016   Procedure: LEFT MASTECTOMY WITH LEFT SENTINEL LYMPH NODE BIOPSY, RIGHT PROPHYLACTIC MASTECTOMY;  Surgeon: Autumn Messing III, MD;  Location: [place];  Service: General;  Laterality: Bilateral;  . PALATE TO GINGIVA GRAFT    . REMOVAL OF BILATERAL TISSUE EXPANDERS WITH PLACEMENT OF BILATERAL BREAST IMPLANTS Bilateral 01/01/2017   Procedure: REMOVAL OF BILATERAL TISSUE EXPANDERS WITH PLACEMENT OF BILATERAL SILICONE BREAST IMPLANTS WITH LIPOSUCTION;  Surgeon: Wallace Going, DO;  Location: [place];  Service: Plastics;  Laterality: Bilateral;  . TISSUE EXPANDER PLACEMENT Bilateral 09/08/2016   Procedure: IMMEDIATE PLACEMENT OF BILATERAL TISSUE EXPANDERS AFTER MASTECTOMIES;  Surgeon: Loel Lofty Dillingham, DO;  Location: [place];  Service: Plastics;  Laterality: Bilateral;    There were no vitals filed for this visit.  Subjective Assessment - 05/05/18 1701    Subjective  Reports pain levels have been good since last session. No problems with exercises. Had vitreous detachment in her eye and had to cancel last session as a result.    Pertinent History  migraines, hypothyroidism, hx of breast CA, B mastectomies with reconstruction and implant placement, abrasion of R arm    Patient Stated Goals  would like to get more ROM back and get rid of scar tissue if possible    Currently in Pain?  No/denies  [facility] Adult PT Treatment/Exercise - 05/05/18 0001      Exercises   Exercises  Shoulder      Shoulder Exercises: Seated   Flexion  AAROM;Both;10 reps;Limitations    Flexion Limitations  to tolerance; with wand    Abduction  AAROM;10 reps;Limitations;Left;Right    ABduction Limitations  to tolerance; with wand      Shoulder Exercises: Standing   External Rotation  Strengthening;Right;10 reps;Theraband;Limitations;Left    Theraband Level  (Shoulder External Rotation)  Level 2 (Red)    External Rotation Weight (lbs)  dowel under elbow for neutral shoulder    Internal Rotation  Strengthening;Right;10 reps;Theraband;Limitations;Left    Theraband Level (Shoulder Internal Rotation)  Level 2 (Red)    Internal Rotation Limitations  dowel under elbow for neutral shoudler    Row  Strengthening;Both;Theraband;15 reps    Theraband Level (Shoulder Row)  Level 3 (Green)    Row Limitations  cues to maintain 90 degress at elbow      Shoulder Exercises: Stretch   Internal Rotation Stretch  5 reps   apley stretch 5x10" on each UE   Other Shoulder Stretches  lat stretch against wall x30 each side      Manual Therapy   Manual Therapy  Soft tissue mobilization;Myofascial release;Passive ROM    Soft tissue mobilization  STM and IASTM to B lats, pec- soft tissue restriction palpable throughout; no c/o pain   good response, no petechiae   Passive ROM  gentle B shoulder abduction PROM in sidelying 3x30" to tol             PT Education - 05/05/18 1747    Education Details  update to HEP; administered green TB    Person(s) Educated  Patient    Methods  Explanation;Demonstration;Tactile cues;Verbal cues;Handout    Comprehension  Verbalized understanding;Returned demonstration       PT Short Term Goals - 04/28/18 1759      PT SHORT TERM GOAL #1   Title  Patient to be independent with initial HEP.    Time  3    Period  Weeks    Status  Achieved        PT Long Term Goals - 04/26/18 1806      PT LONG TERM GOAL #1   Title  Patient to be independent with advanced HEP.    Time  6    Period  Weeks    Status  On-going      PT LONG TERM GOAL #2   Title  Patient to demonstrate B shoulder AROM/PROM WFL and without pain limiting.     Time  6    Period  Weeks    Status  On-going      PT LONG TERM GOAL #3   Title  Patient to demonstrate B shoulder strength >=4+/5.     Time  6    Period  Weeks    Status  On-going      PT LONG  TERM GOAL #4   Title  Patient to demontrate WNL to mild tightness in B pec and lats.    Time  6    Period  Weeks    Status  On-going      PT LONG TERM GOAL #5   Title  Patient to demonstrate proper lifting mechanics to decrease risk of injury.     Time  6    Period  Weeks    Status  On-going  Plan - 05/05/18 1748    Clinical Impression Statement  Patient reporting improvement in pain levels since starting PT. Reports she doesn't feel as tight. Spent majority of session working on manual therapy to address soft tissue restriction. Patient tolerated STM and IASTM well with no c/o pain or signs of petechiae. Better tolerance of sidelying shoulder abduction PROM noted today. Followed manual therapy with AAROM and UE stretching. Progressed scapular row with green TB and introduced shoulder IR/ER with banded resistance. Updated HEP with IR stretch to be performed to tolerance. Patient reported understanding and with no complaints at end of session.     Clinical Impairments Affecting Rehab Potential  migraines, hypothyroidism, hx of breast CA, B mastectomies with reconstruction and implant placement, abrasion of R arm    PT Treatment/Interventions  ADLs/Self Care Home Management;Cryotherapy;Moist Heat;Functional mobility training;Therapeutic activities;Therapeutic exercise;Manual techniques;Patient/family education;Manual lymph drainage;Compression bandaging;Scar mobilization;Passive range of motion;Dry needling;Energy conservation;Splinting;Taping    Consulted and Agree with Plan of Care  Patient       Patient will benefit from skilled therapeutic intervention in order to improve the following deficits and impairments:  Impaired sensation, Decreased scar mobility, Decreased strength, Pain, Impaired UE functional use, Increased fascial restricitons, Decreased range of motion, Improper body mechanics, Postural dysfunction, Impaired flexibility  Visit Diagnosis: Chronic right shoulder  pain  Chronic left shoulder pain  Stiffness of right shoulder, not elsewhere classified  Stiffness of left shoulder, not elsewhere classified  Other symptoms and signs involving the musculoskeletal system     Problem List Patient Active Problem List   Diagnosis Date Noted  . Gastroesophageal reflux disease 12/31/2017  . Hypothyroidism 12/31/2017  . History of breast cancer 12/31/2017  . Hot flashes 12/31/2017  . Depression 12/31/2017  . Other osteoporosis without current pathological fracture 01/29/2017  . Acquired absence of bilateral breasts and nipples 09/23/2016  . Breast cancer ([place]) 09/08/2016  . 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 S/P laparoscopic assisted vaginal hysterectomy (LAVH) 06/17/2016  . Chronic migraine 12/10/2015  . Chest pain 02/10/2014  . Myalgia 02/10/2014  . Hot flashes due to tamoxifen 02/10/2014  . Neoplasm of left breast, primary tumor staging category Tis 05/19/2013    Janene Harvey, PT, DPT 05/05/18 5:53 PM    [facility] [address]  [place], [place], [address] Phone: [phone]   Fax:  [phone]  Name: JADY BRAGGS MRN: [id number] Date of Birth: 05-15-1966

## 2018-05-11 ENCOUNTER — Ambulatory Visit: Payer: BC Managed Care – PPO | Admitting: Physical Therapy

## 2018-05-11 ENCOUNTER — Encounter: Payer: Self-pay | Admitting: Physical Therapy

## 2018-05-11 DIAGNOSIS — M25612 Stiffness of left shoulder, not elsewhere classified: Secondary | ICD-10-CM

## 2018-05-11 DIAGNOSIS — M25611 Stiffness of right shoulder, not elsewhere classified: Secondary | ICD-10-CM

## 2018-05-11 DIAGNOSIS — M25511 Pain in right shoulder: Principal | ICD-10-CM

## 2018-05-11 DIAGNOSIS — M25512 Pain in left shoulder: Secondary | ICD-10-CM

## 2018-05-11 DIAGNOSIS — G8929 Other chronic pain: Secondary | ICD-10-CM

## 2018-05-11 DIAGNOSIS — R29898 Other symptoms and signs involving the musculoskeletal system: Secondary | ICD-10-CM

## 2018-05-11 NOTE — Therapy (Signed)
[facility] [address]  [place], [place], [address] Phone: [phone]   Fax:  [phone]  Physical Therapy Treatment  Patient Details  Name: [name] MRN: [id number] Date of Birth: [birth year] Referring Provider (PT): Burney Gauze, MD   Encounter Date: 05/11/2018  PT End of Session - 05/11/18 1742    Visit Number  5    Number of Visits  13    Date for PT Re-Evaluation  05/24/18    Authorization Type  State and Federal BCBS    PT Start Time  1701    PT Stop Time  1740    PT Time Calculation (min)  39 min    Activity Tolerance  Patient tolerated treatment well    Behavior During Therapy  [facility] for tasks assessed/performed       Past Medical History:  Diagnosis Date  . Abrasion of right arm 12/25/2016  . Complication of anesthesia    panic attack while waking up  . History of breast cancer 2014  . Hypothyroidism   . Migraines   . PONV (postoperative nausea and vomiting)     Past Surgical History:  Procedure Laterality Date  . BREAST LUMPECTOMY WITH NEEDLE LOCALIZATION Left 06/20/2013   Procedure: BREAST LUMPECTOMY WITH NEEDLE LOCALIZATION;  Surgeon: Merrie Roof, MD;  Location: [place];  Service: General;  Laterality: Left;  . BREAST RECONSTRUCTION Left 04/09/2017   Procedure: REVISION BREAST RECONSTRUCTION;  Surgeon: Wallace Going, DO;  Location: [place];  Service: Plastics;  Laterality: Left;  . LAPAROSCOPIC VAGINAL HYSTERECTOMY WITH SALPINGO OOPHORECTOMY Bilateral 06/17/2016   Procedure: LAPAROSCOPIC ASSISTED VAGINAL HYSTERECTOMY WITH SALPINGO OOPHORECTOMY;  Surgeon: Arvella Nigh, MD;  Location: [place];  Service: Gynecology;  Laterality: Bilateral;  need bed  . LIPOSUCTION WITH LIPOFILLING Left 04/09/2017   Procedure: LIPOFILLING TO THE LEFT UPPER BREAST TO IMPROVE SYMMETRY.;  Surgeon: Wallace Going, DO;  Location: [place];  Service: Plastics;  Laterality: Left;  Marland Kitchen MASTECTOMY W/ SENTINEL NODE BIOPSY Bilateral 09/08/2016   Procedure: LEFT MASTECTOMY WITH LEFT SENTINEL LYMPH NODE BIOPSY, RIGHT PROPHYLACTIC MASTECTOMY;  Surgeon: Autumn Messing III, MD;  Location: [place];  Service: General;  Laterality: Bilateral;  . PALATE TO GINGIVA GRAFT    . REMOVAL OF BILATERAL TISSUE EXPANDERS WITH PLACEMENT OF BILATERAL BREAST IMPLANTS Bilateral 01/01/2017   Procedure: REMOVAL OF BILATERAL TISSUE EXPANDERS WITH PLACEMENT OF BILATERAL SILICONE BREAST IMPLANTS WITH LIPOSUCTION;  Surgeon: Wallace Going, DO;  Location: [place];  Service: Plastics;  Laterality: Bilateral;  . TISSUE EXPANDER PLACEMENT Bilateral 09/08/2016   Procedure: IMMEDIATE PLACEMENT OF BILATERAL TISSUE EXPANDERS AFTER MASTECTOMIES;  Surgeon: Loel Lofty Dillingham, DO;  Location: [place];  Service: Plastics;  Laterality: Bilateral;    There were no vitals filed for this visit.  Subjective Assessment - 05/11/18 1703    Subjective  Patient reports nothing is new. No problems with exercises. Reports she can see a big difference in ROM and decreased bands of scar tissue.     Pertinent History  migraines, hypothyroidism, hx of breast CA, B mastectomies with reconstruction and implant placement, abrasion of R arm    Patient Stated Goals  would like to get more ROM back and get rid of scar tissue if possible    Currently in Pain?  No/denies  [facility] Adult PT Treatment/Exercise - 05/11/18 0001      Exercises   Exercises  Shoulder      Shoulder Exercises: Standing   External Rotation  Strengthening;10 reps;Theraband;Limitations;Both    Theraband Level (Shoulder External Rotation)  Level 2 (Red)    External Rotation Weight (lbs)  2x10; cues to maintain elbows at sides    Extension  Strengthening;Both;10 reps;Weights    Theraband Level (Shoulder Extension)  Level 2 (Red)    Extension Limitations  2x10;  cues for straight elbows and core contraction    Row  Strengthening;Both;Theraband;15 reps    Theraband Level (Shoulder Row)  Level 3 (Green)    Row Limitations  2x15   cues for scap squeeze     Shoulder Exercises: Pulleys   Flexion  3 minutes    Flexion Limitations  to tolerance    Scaption  3 minutes    Scaption Limitations  to tolerance      Shoulder Exercises: ROM/Strengthening   Lat Pull  Limitations    Lat Pull Limitations  2x10 with 3 sec stretch at top wide grip lat pull with cues for scap squeeze      Shoulder Exercises: Stretch   Corner Stretch  1 rep;30 seconds;Limitations    Corner Stretch Limitations  90/90 at door to tolerance    Internal Rotation Stretch  5 reps   5x10" hold to tolerance with strap on B UEs   Other Shoulder Stretches  lat stretch against wall x30 each side to tolerance    Other Shoulder Stretches  B QL/lat stretch at doorway 20" each UE to tolerance             PT Education - 05/11/18 1742    Education Details  update to HEP    Person(s) Educated  Patient    Methods  Explanation;Demonstration;Tactile cues;Verbal cues;Handout    Comprehension  Verbalized understanding;Returned demonstration       PT Short Term Goals - 04/28/18 1759      PT SHORT TERM GOAL #1   Title  Patient to be independent with initial HEP.    Time  3    Period  Weeks    Status  Achieved        PT Long Term Goals - 04/26/18 1806      PT LONG TERM GOAL #1   Title  Patient to be independent with advanced HEP.    Time  6    Period  Weeks    Status  On-going      PT LONG TERM GOAL #2   Title  Patient to demonstrate B shoulder AROM/PROM WFL and without pain limiting.     Time  6    Period  Weeks    Status  On-going      PT LONG TERM GOAL #3   Title  Patient to demonstrate B shoulder strength >=4+/5.     Time  6    Period  Weeks    Status  On-going      PT LONG TERM GOAL #4   Title  Patient to demontrate WNL to mild tightness in B pec and lats.     Time  6    Period  Weeks    Status  On-going      PT LONG TERM GOAL #5   Title  Patient to demonstrate proper lifting mechanics to decrease risk of injury.     Time  6    Period  Weeks  Status  On-going            Plan - 05/11/18 1737    Clinical Impression Statement  Patient reporting large improvement in ROM and decreased bands of scar tissue since starting PT. Focused on periscapular and RTC strengthening today to improve UE strength and stability. Patient requiring intermittent cues for correct form for new exercises. Tolerated all stretching with observable improvement in ROM and tolerance today. Reviewed and consolidated HEP and updated HEP with new exercises. Patient reported understanding. Because patient reporting improvement in pain levels, ROM, and ability to perform ADLs, may be ready to wrap up within coming visits. Plan to take shoulder ROM measurements next session to quantify progress.     Clinical Impairments Affecting Rehab Potential  migraines, hypothyroidism, hx of breast CA, B mastectomies with reconstruction and implant placement, abrasion of R arm    PT Treatment/Interventions  ADLs/Self Care Home Management;Cryotherapy;Moist Heat;Functional mobility training;Therapeutic activities;Therapeutic exercise;Manual techniques;Patient/family education;Manual lymph drainage;Compression bandaging;Scar mobilization;Passive range of motion;Dry needling;Energy conservation;Splinting;Taping    PT Next Visit Plan  reassess ROM measurements next session    Consulted and Agree with Plan of Care  Patient       Patient will benefit from skilled therapeutic intervention in order to improve the following deficits and impairments:  Impaired sensation, Decreased scar mobility, Decreased strength, Pain, Impaired UE functional use, Increased fascial restricitons, Decreased range of motion, Improper body mechanics, Postural dysfunction, Impaired flexibility  Visit Diagnosis: Chronic  right shoulder pain  Chronic left shoulder pain  Stiffness of right shoulder, not elsewhere classified  Stiffness of left shoulder, not elsewhere classified  Other symptoms and signs involving the musculoskeletal system     Problem List Patient Active Problem List   Diagnosis Date Noted  . Gastroesophageal reflux disease 12/31/2017  . Hypothyroidism 12/31/2017  . History of breast cancer 12/31/2017  . Hot flashes 12/31/2017  . Depression 12/31/2017  . Other osteoporosis without current pathological fracture 01/29/2017  . Acquired absence of bilateral breasts and nipples 09/23/2016  . Breast cancer ([place]) 09/08/2016  . 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 S/P laparoscopic assisted vaginal hysterectomy (LAVH) 06/17/2016  . Chronic migraine 12/10/2015  . Chest pain 02/10/2014  . Myalgia 02/10/2014  . Hot flashes due to tamoxifen 02/10/2014  . Neoplasm of left breast, primary tumor staging category Tis 05/19/2013    Janene Harvey, PT, DPT 05/11/18 5:48 PM   [facility] [address]  [place], [place], [address] Phone: [phone]   Fax:  [phone]  Name: JAHLIA OMURA MRN: [id number] Date of Birth: [birth year]

## 2018-05-13 ENCOUNTER — Ambulatory Visit: Payer: BC Managed Care – PPO | Admitting: Physical Therapy

## 2018-05-13 ENCOUNTER — Encounter: Payer: Self-pay | Admitting: Physical Therapy

## 2018-05-13 DIAGNOSIS — M25611 Stiffness of right shoulder, not elsewhere classified: Secondary | ICD-10-CM

## 2018-05-13 DIAGNOSIS — M25512 Pain in left shoulder: Secondary | ICD-10-CM

## 2018-05-13 DIAGNOSIS — M25511 Pain in right shoulder: Secondary | ICD-10-CM | POA: Diagnosis not present

## 2018-05-13 DIAGNOSIS — R29898 Other symptoms and signs involving the musculoskeletal system: Secondary | ICD-10-CM

## 2018-05-13 DIAGNOSIS — M25612 Stiffness of left shoulder, not elsewhere classified: Secondary | ICD-10-CM

## 2018-05-13 DIAGNOSIS — G8929 Other chronic pain: Secondary | ICD-10-CM

## 2018-05-13 NOTE — Therapy (Addendum)
[facility] [address]  [place], [place], [address] Phone: [phone]   Fax:  [phone]  Physical Therapy Treatment  Patient Details  Name: [name] MRN: [id number] Date of Birth: [birth year] Referring Provider (PT): Burney Gauze, MD   Encounter Date: 05/13/2018  PT End of Session - 05/13/18 1710    Visit Number  6    Number of Visits  13    Date for PT Re-Evaluation  05/24/18    Authorization Type  State and Federal BCBS    PT Start Time  1710    PT Stop Time  1747    PT Time Calculation (min)  37 min    Activity Tolerance  Patient tolerated treatment well    Behavior During Therapy  [facility] for tasks assessed/performed       Past Medical History:  Diagnosis Date  . Abrasion of right arm 12/25/2016  . Complication of anesthesia    panic attack while waking up  . History of breast cancer 2014  . Hypothyroidism   . Migraines   . PONV (postoperative nausea and vomiting)     Past Surgical History:  Procedure Laterality Date  . BREAST LUMPECTOMY WITH NEEDLE LOCALIZATION Left 06/20/2013   Procedure: BREAST LUMPECTOMY WITH NEEDLE LOCALIZATION;  Surgeon: Merrie Roof, MD;  Location: [place];  Service: General;  Laterality: Left;  . BREAST RECONSTRUCTION Left 04/09/2017   Procedure: REVISION BREAST RECONSTRUCTION;  Surgeon: Wallace Going, DO;  Location: [place];  Service: Plastics;  Laterality: Left;  . LAPAROSCOPIC VAGINAL HYSTERECTOMY WITH SALPINGO OOPHORECTOMY Bilateral 06/17/2016   Procedure: LAPAROSCOPIC ASSISTED VAGINAL HYSTERECTOMY WITH SALPINGO OOPHORECTOMY;  Surgeon: Arvella Nigh, MD;  Location: [place];  Service: Gynecology;  Laterality: Bilateral;  need bed  . LIPOSUCTION WITH LIPOFILLING Left 04/09/2017   Procedure: LIPOFILLING TO THE LEFT UPPER BREAST TO IMPROVE SYMMETRY.;  Surgeon: Wallace Going, DO;  Location: [place];  Service: Plastics;  Laterality: Left;  Marland Kitchen MASTECTOMY W/ SENTINEL NODE BIOPSY Bilateral 09/08/2016   Procedure: LEFT MASTECTOMY WITH LEFT SENTINEL LYMPH NODE BIOPSY, RIGHT PROPHYLACTIC MASTECTOMY;  Surgeon: Autumn Messing III, MD;  Location: [place];  Service: General;  Laterality: Bilateral;  . PALATE TO GINGIVA GRAFT    . REMOVAL OF BILATERAL TISSUE EXPANDERS WITH PLACEMENT OF BILATERAL BREAST IMPLANTS Bilateral 01/01/2017   Procedure: REMOVAL OF BILATERAL TISSUE EXPANDERS WITH PLACEMENT OF BILATERAL SILICONE BREAST IMPLANTS WITH LIPOSUCTION;  Surgeon: Wallace Going, DO;  Location: [place];  Service: Plastics;  Laterality: Bilateral;  . TISSUE EXPANDER PLACEMENT Bilateral 09/08/2016   Procedure: IMMEDIATE PLACEMENT OF BILATERAL TISSUE EXPANDERS AFTER MASTECTOMIES;  Surgeon: Loel Lofty Dillingham, DO;  Location: [place];  Service: Plastics;  Laterality: Bilateral;    There were no vitals filed for this visit.  Subjective Assessment - 05/13/18 1711    Subjective  Pt doing well - no complaints. No concerns with recent update of HEP.    Pertinent History  migraines, hypothyroidism, hx of breast CA, B mastectomies with reconstruction and implant placement, abrasion of R arm    Patient Stated Goals  would like to get more ROM back and get rid of scar tissue if possible    Currently in Pain?  No/denies         [facility] PT Assessment - 05/13/18 1710      Assessment   Medical Diagnosis  Chronic chest wall pain  Referring Provider (PT)  Burney Gauze, MD    Onset Date/Surgical Date  --   Feb 2018   Hand Dominance  Right    Next MD Visit  08/02/2018      AROM   Right Shoulder Flexion  155 Degrees    Right Shoulder ABduction  132 Degrees    Right Shoulder Internal Rotation  88 Degrees   "tight & popping" but no pain - resolved after MT   Right Shoulder External Rotation  78 Degrees    Left Shoulder Flexion  154 Degrees    Left Shoulder ABduction  138 Degrees     Left Shoulder Internal Rotation  100 Degrees    Left Shoulder External Rotation  82 Degrees      PROM   Right Shoulder Flexion  --    Right Shoulder ABduction  --    Right Shoulder Internal Rotation  --    Right Shoulder External Rotation  --    Left Shoulder Flexion  --    Left Shoulder ABduction  --    Left Shoulder Internal Rotation  --    Left Shoulder External Rotation  --      Strength   Right Shoulder Flexion  4+/5    Right Shoulder ABduction  5/5    Right Shoulder Internal Rotation  5/5    Right Shoulder External Rotation  5/5    Left Shoulder Flexion  5/5    Left Shoulder ABduction  5/5    Left Shoulder Internal Rotation  5/5    Left Shoulder External Rotation  5/5                   [facility] Adult PT Treatment/Exercise - 05/13/18 1710      Exercises   Exercises  Shoulder      Shoulder Exercises: Pulleys   Flexion  3 minutes    Flexion Limitations  to tolerance    Scaption  3 minutes    Scaption Limitations  to tolerance               PT Short Term Goals - 04/28/18 1759      PT SHORT TERM GOAL #1   Title  Patient to be independent with initial HEP.    Time  3    Period  Weeks    Status  Achieved        PT Long Term Goals - 05/13/18 1713      PT LONG TERM GOAL #1   Title  Patient to be independent with advanced HEP.    Time  6    Period  Weeks    Status  Achieved      PT LONG TERM GOAL #2   Title  Patient to demonstrate B shoulder AROM/PROM WFL and without pain limiting.     Time  6    Period  Weeks    Status  Achieved      PT LONG TERM GOAL #3   Title  Patient to demonstrate B shoulder strength >= 4+/5.     Time  6    Period  Weeks    Status  Achieved      PT LONG TERM GOAL #4   Title  Patient to demontrate WNL to mild tightness in B pec and lats.    Time  6    Period  Weeks    Status  Achieved      PT LONG TERM GOAL #5   Title  Patient to demonstrate proper lifting mechanics to decrease risk of injury.     Time  6     Period  Weeks    Status  Achieved            Plan - 05/13/18 1712    Clinical Impression Statement  [name] has demonstrated excellent progress with PT with restoration of B shoulder AROM to [facility] without pain, noting only "popping & tightness" with R shoulder IR which resolved after manual therapy. Mild tightness persisting in R pecs and anterior deltoid as well as R lats and teres group - this responded well to manual therapy and pt made aware of how to continue to address with self-STM at home. B shoulder strength now grossly 5/5 with no pain upon resistance. Pt feels confident with HEP & proper lifting mechanics. All goals met at this time and pt feels comfortable transitioning to HEP at this time - will place pt on 30 day hold for PT in the event that issues arise that require further PT.    Rehab Potential  Good    Clinical Impairments Affecting Rehab Potential  migraines, hypothyroidism, hx of breast CA, B mastectomies with reconstruction and implant placement, abrasion of R arm    PT Treatment/Interventions  ADLs/Self Care Home Management;Cryotherapy;Moist Heat;Functional mobility training;Therapeutic activities;Therapeutic exercise;Manual techniques;Patient/family education;Manual lymph drainage;Compression bandaging;Scar mobilization;Passive range of motion;Dry needling;Energy conservation;Splinting;Taping    PT Next Visit Plan  30 day hold    Consulted and Agree with Plan of Care  Patient       Patient will benefit from skilled therapeutic intervention in order to improve the following deficits and impairments:  Impaired sensation, Decreased scar mobility, Decreased strength, Pain, Impaired UE functional use, Increased fascial restricitons, Decreased range of motion, Improper body mechanics, Postural dysfunction, Impaired flexibility  Visit Diagnosis: Chronic right shoulder pain  Chronic left shoulder pain  Stiffness of right shoulder, not elsewhere classified  Stiffness of left  shoulder, not elsewhere classified  Other symptoms and signs involving the musculoskeletal system     Problem List Patient Active Problem List   Diagnosis Date Noted  . Gastroesophageal reflux disease 12/31/2017  . Hypothyroidism 12/31/2017  . History of breast cancer 12/31/2017  . Hot flashes 12/31/2017  . Depression 12/31/2017  . Other osteoporosis without current pathological fracture 01/29/2017  . Acquired absence of bilateral breasts and nipples 09/23/2016  . Breast cancer ([place]) 09/08/2016  . 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 S/P laparoscopic assisted vaginal hysterectomy (LAVH) 06/17/2016  . Chronic migraine 12/10/2015  . Chest pain 02/10/2014  . Myalgia 02/10/2014  . Hot flashes due to tamoxifen 02/10/2014  . Neoplasm of left breast, primary tumor staging category Tis 05/19/2013    Percival Spanish, PT, MPT 05/13/2018, 6:10 PM  [facility] [address]  [place], [place], [address] Phone: [phone]   Fax:  [phone]  Name: [name] MRN: [id number] Date of Birth: 1966/01/15  PHYSICAL THERAPY DISCHARGE SUMMARY  Visits from Start of Care: 6  Current functional level related to goals / functional outcomes: See above clinical impression; patient did not return after being placed on 30 day hold   Remaining deficits: None   Education / Equipment: HEP  Plan: Patient agrees to discharge.  Patient goals were met. Patient is being discharged due to meeting the stated rehab goals.  ?????     Janene Harvey, PT, DPT 06/14/18 9:57 AM

## 2018-05-17 ENCOUNTER — Ambulatory Visit: Payer: BC Managed Care – PPO | Admitting: Physical Therapy

## 2018-05-19 ENCOUNTER — Ambulatory Visit: Payer: BC Managed Care – PPO | Admitting: Physical Therapy

## 2018-05-20 ENCOUNTER — Encounter: Payer: BC Managed Care – PPO | Admitting: Physical Therapy

## 2018-05-24 ENCOUNTER — Encounter: Payer: BC Managed Care – PPO | Admitting: Physical Therapy

## 2018-05-26 ENCOUNTER — Ambulatory Visit (INDEPENDENT_AMBULATORY_CARE_PROVIDER_SITE_OTHER): Payer: BC Managed Care – PPO | Admitting: Family

## 2018-05-26 ENCOUNTER — Encounter: Payer: BC Managed Care – PPO | Admitting: Physical Therapy

## 2018-05-26 VITALS — BP 110/65 | HR 96 | Temp 98.9°F | Resp 16 | Ht 67.0 in | Wt 220.0 lb

## 2018-05-26 DIAGNOSIS — Z23 Encounter for immunization: Secondary | ICD-10-CM

## 2018-05-26 DIAGNOSIS — F329 Major depressive disorder, single episode, unspecified: Secondary | ICD-10-CM | POA: Diagnosis not present

## 2018-05-26 DIAGNOSIS — R232 Flushing: Secondary | ICD-10-CM

## 2018-05-26 DIAGNOSIS — G43909 Migraine, unspecified, not intractable, without status migrainosus: Secondary | ICD-10-CM | POA: Diagnosis not present

## 2018-05-26 DIAGNOSIS — F32A Depression, unspecified: Secondary | ICD-10-CM

## 2018-05-26 MED ORDER — CITALOPRAM HYDROBROMIDE 20 MG PO TABS: 20.0000 mg | ORAL_TABLET | Freq: Every day | ORAL | 3 refills | Status: DC

## 2018-05-26 NOTE — Patient Instructions (Addendum)
Stop lexapro, start citalopram. Let me know if you have any issues with mood with this switch.

## 2018-05-26 NOTE — Progress Notes (Signed)
Subjective:    Patient ID: [name], female    DOB: [birth year], 52 y.o.   MRN: [id number]  HPI  Migraines-  Reports that she has an appointment with "headache specialists."  Reports that she continues to have daily headaches.  Wt Readings from Last 3 Encounters:  05/26/18 220 lb (99.8 kg)  03/26/18 220 lb 12.8 oz (100.2 kg)  03/16/18 218 lb (98.9 kg)   Depression-she continues increased dose of Lexapro.  This is increased from 5 mg to 10 mg last visit.  She reports improvement in her mood.  She denies side effects.    Her chief complaint today is hot flashes.  Reports that she is having the day and night.  This has worsened since Megace was discontinued.  This was discontinued due to weight gain.  She reports that she has tried Effexor in the past but did not tolerate.  Review of Systems See HPI  Past Medical History:  Diagnosis Date  . Abrasion of right arm 12/25/2016  . Complication of anesthesia    panic attack while waking up  . History of breast cancer 2014  . Hypothyroidism   . Migraines   . PONV (postoperative nausea and vomiting)      Social History   Socioeconomic History  . Marital status: Married    Spouse name: [name]  . Number of children: 0  . Years of education: Masters  . Highest education level: Not on file  Occupational History  . Occupation: Medical Records  Social Needs  . Financial resource strain: Not on file  . Food insecurity:    Worry: Not on file    Inability: Not on file  . Transportation needs:    Medical: Not on file    Non-medical: Not on file  Tobacco Use  . Smoking status: Never Smoker  . Smokeless tobacco: Never Used  Substance and Sexual Activity  . Alcohol use: No    Alcohol/week: 0.0 standard drinks  . Drug use: No  . Sexual activity: Yes  Lifestyle  . Physical activity:    Days per week: Not on file    Minutes per session: Not on file  . Stress: Not on file  Relationships  . Social connections:    Talks on  phone: Not on file    Gets together: Not on file    Attends religious service: Not on file    Active member of club or organization: Not on file    Attends meetings of clubs or organizations: Not on file    Relationship status: Not on file  . Intimate partner violence:    Fear of current or ex partner: Not on file    Emotionally abused: Not on file    Physically abused: Not on file    Forced sexual activity: Not on file  Other Topics Concern  . Not on file  Social History Narrative   Lives at home with husband.   Right-handed.   2-3 cups caffeine daily.   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 BREAST LUMPECTOMY WITH NEEDLE LOCALIZATION Left 06/20/2013   Procedure: BREAST LUMPECTOMY WITH NEEDLE LOCALIZATION;  Surgeon: Merrie Roof, MD;  Location: [place];  Service: General;  Laterality: Left;  . BREAST RECONSTRUCTION Left 04/09/2017   Procedure: REVISION BREAST RECONSTRUCTION;  Surgeon:  Dillingham, Loel Lofty, DO;  Location: [place];  Service: Plastics;  Laterality: Left;  . LAPAROSCOPIC VAGINAL HYSTERECTOMY WITH SALPINGO OOPHORECTOMY Bilateral 06/17/2016   Procedure: LAPAROSCOPIC ASSISTED VAGINAL HYSTERECTOMY WITH SALPINGO OOPHORECTOMY;  Surgeon: Arvella Nigh, MD;  Location: [place];  Service: Gynecology;  Laterality: Bilateral;  need bed  . LIPOSUCTION WITH LIPOFILLING Left 04/09/2017   Procedure: LIPOFILLING TO THE LEFT UPPER BREAST TO IMPROVE SYMMETRY.;  Surgeon: Wallace Going, DO;  Location: [place];  Service: Plastics;  Laterality: Left;  Marland Kitchen MASTECTOMY W/ SENTINEL NODE BIOPSY Bilateral 09/08/2016   Procedure: LEFT MASTECTOMY WITH LEFT SENTINEL LYMPH NODE BIOPSY, RIGHT PROPHYLACTIC MASTECTOMY;  Surgeon: Autumn Messing III, MD;  Location: [place];  Service: General;   Laterality: Bilateral;  . PALATE TO GINGIVA GRAFT    . REMOVAL OF BILATERAL TISSUE EXPANDERS WITH PLACEMENT OF BILATERAL BREAST IMPLANTS Bilateral 01/01/2017   Procedure: REMOVAL OF BILATERAL TISSUE EXPANDERS WITH PLACEMENT OF BILATERAL SILICONE BREAST IMPLANTS WITH LIPOSUCTION;  Surgeon: Wallace Going, DO;  Location: [place];  Service: Plastics;  Laterality: Bilateral;  . TISSUE EXPANDER PLACEMENT Bilateral 09/08/2016   Procedure: IMMEDIATE PLACEMENT OF BILATERAL TISSUE EXPANDERS AFTER MASTECTOMIES;  Surgeon: Loel Lofty Dillingham, DO;  Location: [place];  Service: Plastics;  Laterality: Bilateral;    Family History  Problem Relation Age of Onset  . Breast cancer Mother 28  . Heart disease Mother        Atrial fibrillation  . Melanoma Father 84  . COPD Father   . Arthritis Father   . Breast cancer Sister 62       ER+/her2-; onco score = 8  . Cancer Maternal Aunt        oral cancer; smoker  . Head & neck cancer Paternal Uncle        smoker  . Breast cancer Cousin        dx in her late 19s-60s    Allergies  Allergen Reactions  . Penicillins Rash    Current Outpatient Medications on File Prior to Visit  Medication Sig Dispense Refill  . Calcium Carbonate-Vitamin D (CALCIUM + D PO) Take 2 tablets by mouth daily.     . cholecalciferol (VITAMIN D) 1000 units tablet Take 1,000 Units by mouth daily.     Marland Kitchen escitalopram (LEXAPRO) 10 MG tablet Take 1 tablet (10 mg total) by mouth daily. 30 tablet 2  . KRILL OIL OMEGA-3 PO Take 1 tablet by mouth daily.     Marland Kitchen levothyroxine (SYNTHROID, LEVOTHROID) 125 MCG tablet Take 125 mcg by mouth daily before breakfast.    . magnesium oxide (MAG-OX) 400 MG tablet Take 400 mg by mouth daily.    . Multiple Vitamin (MULTIVITAMIN) tablet Take 1 tablet by mouth daily.    . nortriptyline (PAMELOR) 10 MG capsule Take 3 capsules (30 mg total) by mouth at bedtime. 90 capsule 11  . ondansetron (ZOFRAN ODT) 4 MG disintegrating tablet Take 1  tablet (4 mg total) by mouth every 8 (eight) hours as needed. 20 tablet 6  . pantoprazole (PROTONIX) 40 MG tablet TAKE 1 TABLET(40 MG) BY MOUTH TWICE DAILY 60 tablet 5  . Riboflavin 400 MG TABS Take by mouth.    . rizatriptan (MAXALT-MLT) 10 MG disintegrating tablet Take 1 tablet (10 mg total) by mouth as needed for migraine. May repeat in 2 hours if needed 15 tablet 11  . SUMAtriptan 6 MG/0.5ML SOAJ Inject 6 mg into the skin as needed.  12 Cartridge 11  . tiZANidine (ZANAFLEX) 4 MG tablet Take 1 tablet (4 mg total) by mouth every 6 (six) hours as needed for muscle spasms. 30 tablet 6  . vitamin E (VITAMIN E) 1000 UNIT capsule Take 1,000 Units by mouth daily.     No current facility-administered medications on file prior to visit.     BP 110/65 (BP Location: Right Arm, Patient Position: Sitting, Cuff Size: Large)   Pulse 96   Temp 98.9 F (37.2 C) (Oral)   Resp 16   Ht '5\' 7"'  (1.702 m)   Wt 220 lb (99.8 kg)   LMP 02/01/2014   SpO2 100%   BMI 34.46 kg/m       Objective:   Physical Exam  Constitutional: She appears well-developed and well-nourished.  Cardiovascular: Normal rate, regular rhythm and normal heart sounds.  No murmur heard. Pulmonary/Chest: Effort normal and breath sounds normal. No respiratory distress. She has no wheezes.  Psychiatric: She has a normal mood and affect. Her behavior is normal. Judgment and thought content normal.          Assessment & Plan:  Depression-this is improved on increased dose of Lexapro.  Last PHQ 9 score was 11.  She scored 3 today.  See below.  Hot flashes-we will change her Lexapro to citalopram to see if this will better control her hot flashes while still controlling her depression.  Migraines-these are uncontrolled.  She is scheduled to see headache specialist.

## 2018-06-22 ENCOUNTER — Ambulatory Visit: Payer: BC Managed Care – PPO | Admitting: Neurology

## 2018-07-06 ENCOUNTER — Encounter: Payer: Self-pay | Admitting: Plastic Surgery

## 2018-07-06 ENCOUNTER — Ambulatory Visit (INDEPENDENT_AMBULATORY_CARE_PROVIDER_SITE_OTHER): Payer: BC Managed Care – PPO | Admitting: Plastic Surgery

## 2018-07-06 VITALS — BP 128/82 | HR 86 | Resp 14 | Ht 67.0 in | Wt 228.0 lb

## 2018-07-06 DIAGNOSIS — Z9013 Acquired absence of bilateral breasts and nipples: Secondary | ICD-10-CM

## 2018-07-06 DIAGNOSIS — D0502 Lobular carcinoma in situ of left breast: Secondary | ICD-10-CM

## 2018-07-06 DIAGNOSIS — Z9889 Other specified postprocedural states: Secondary | ICD-10-CM | POA: Diagnosis not present

## 2018-07-06 NOTE — Progress Notes (Signed)
Patient ID: [name], female    DOB: 26-Apr-1966, 52 y.o.   MRN: [id number]   Chief Complaint  Patient presents with  . Breast Problem    [name] is a 52 yrs old wf here for a one-year follow-up on her bilateral breast reconstruction.  She underwent bilateral mastectomies after being diagnosed with a left ductal carcinoma in situ (ER/PR negative) January 2018.  She had a negative mammogram and then an MRI 2 months later which showed 2 masses in the left breast.  Biopsies were positive for high-grade ductal carcinoma in situ.  She had left breast lobular carcinoma in the past treated with lumpectomy but no radiation in 2014. she had reconstruction by me with expander and flex HD followed by implant placement.  She does have a family history of breast cancer in her mother and in her sister.  Preop bra 38 DD.  She is interested in nipple areole reconstruction.  No masses or areas of concern palpated.  Implants are in good position and no sign of capsule contracture.  She had a rough year with multiple hospitalizations and passed away last month.   Review of Systems  Constitutional: Negative.  Negative for activity change and appetite change.  HENT: Negative.   Eyes: Negative.   Gastrointestinal: Negative.   Endocrine: Negative.   Genitourinary: Negative.   Musculoskeletal: Negative.   Skin: Negative.  Negative for color change and wound.  Neurological: Negative.     Past Medical History:  Diagnosis Date  . Abrasion of right arm 12/25/2016  . Complication of anesthesia    panic attack while waking up  . History of breast cancer 2014  . Hypothyroidism   . Migraines   . PONV (postoperative nausea and vomiting)     Past Surgical History:  Procedure Laterality Date  . BREAST LUMPECTOMY WITH NEEDLE LOCALIZATION Left 06/20/2013   Procedure: BREAST LUMPECTOMY WITH NEEDLE LOCALIZATION;  Surgeon: Merrie Roof, MD;  Location: [place];  Service: General;   Laterality: Left;  . BREAST RECONSTRUCTION Left 04/09/2017   Procedure: REVISION BREAST RECONSTRUCTION;  Surgeon: Wallace Going, DO;  Location: [place];  Service: Plastics;  Laterality: Left;  . LAPAROSCOPIC VAGINAL HYSTERECTOMY WITH SALPINGO OOPHORECTOMY Bilateral 06/17/2016   Procedure: LAPAROSCOPIC ASSISTED VAGINAL HYSTERECTOMY WITH SALPINGO OOPHORECTOMY;  Surgeon: Arvella Nigh, MD;  Location: [place];  Service: Gynecology;  Laterality: Bilateral;  need bed  . LIPOSUCTION WITH LIPOFILLING Left 04/09/2017   Procedure: LIPOFILLING TO THE LEFT UPPER BREAST TO IMPROVE SYMMETRY.;  Surgeon: Wallace Going, DO;  Location: [place];  Service: Plastics;  Laterality: Left;  Marland Kitchen MASTECTOMY W/ SENTINEL NODE BIOPSY Bilateral 09/08/2016   Procedure: LEFT MASTECTOMY WITH LEFT SENTINEL LYMPH NODE BIOPSY, RIGHT PROPHYLACTIC MASTECTOMY;  Surgeon: Autumn Messing III, MD;  Location: [place];  Service: General;  Laterality: Bilateral;  . PALATE TO GINGIVA GRAFT    . REMOVAL OF BILATERAL TISSUE EXPANDERS WITH PLACEMENT OF BILATERAL BREAST IMPLANTS Bilateral 01/01/2017   Procedure: REMOVAL OF BILATERAL TISSUE EXPANDERS WITH PLACEMENT OF BILATERAL SILICONE BREAST IMPLANTS WITH LIPOSUCTION;  Surgeon: Wallace Going, DO;  Location: [place];  Service: Plastics;  Laterality: Bilateral;  . TISSUE EXPANDER PLACEMENT Bilateral 09/08/2016   Procedure: IMMEDIATE PLACEMENT OF BILATERAL TISSUE EXPANDERS AFTER MASTECTOMIES;  Surgeon: Loel Lofty Dillingham, DO;  Location: [place];  Service: Plastics;  Laterality: Bilateral;      Current Outpatient Medications:  .  Calcium Carbonate-Vitamin D (CALCIUM + D PO), Take 2 tablets by mouth daily. , Disp: , Rfl:  .  cholecalciferol (VITAMIN D) 1000 units tablet, Take 1,000 Units by mouth daily. , Disp: , Rfl:  .  citalopram (CELEXA) 20 MG tablet, Take 1 tablet (20 mg total) by mouth daily., Disp: 30 tablet, Rfl: 3 .   KRILL OIL OMEGA-3 PO, Take 1 tablet by mouth daily. , Disp: , Rfl:  .  levothyroxine (SYNTHROID, LEVOTHROID) 125 MCG tablet, Take 125 mcg by mouth daily before breakfast., Disp: , Rfl:  .  magnesium oxide (MAG-OX) 400 MG tablet, Take 400 mg by mouth daily., Disp: , Rfl:  .  Multiple Vitamin (MULTIVITAMIN) tablet, Take 1 tablet by mouth daily., Disp: , Rfl:  .  nortriptyline (PAMELOR) 10 MG capsule, Take 3 capsules (30 mg total) by mouth at bedtime., Disp: 90 capsule, Rfl: 11 .  ondansetron (ZOFRAN ODT) 4 MG disintegrating tablet, Take 1 tablet (4 mg total) by mouth every 8 (eight) hours as needed., Disp: 20 tablet, Rfl: 6 .  pantoprazole (PROTONIX) 40 MG tablet, TAKE 1 TABLET(40 MG) BY MOUTH TWICE DAILY, Disp: 60 tablet, Rfl: 5 .  Riboflavin 400 MG TABS, Take by mouth., Disp: , Rfl:  .  rizatriptan (MAXALT-MLT) 10 MG disintegrating tablet, Take 1 tablet (10 mg total) by mouth as needed for migraine. May repeat in 2 hours if needed, Disp: 15 tablet, Rfl: 11 .  SUMAtriptan 6 MG/0.5ML SOAJ, Inject 6 mg into the skin as needed., Disp: 12 Cartridge, Rfl: 11 .  tiZANidine (ZANAFLEX) 4 MG tablet, Take 1 tablet (4 mg total) by mouth every 6 (six) hours as needed for muscle spasms., Disp: 30 tablet, Rfl: 6 .  vitamin E (VITAMIN E) 1000 UNIT capsule, Take 1,000 Units by mouth daily., Disp: , Rfl:    Objective:   Vitals:   07/06/18 0822  BP: 128/82  Pulse: 86  Resp: 14  SpO2: 90%    Physical Exam  Constitutional: She is oriented to person, place, and time. She appears well-developed and well-nourished.  HENT:  Head: Normocephalic and atraumatic.  Eyes: Pupils are equal, round, and reactive to light. EOM are normal.  Cardiovascular: Normal rate.  Pulmonary/Chest: Effort normal.  Abdominal: Soft. She exhibits no distension.  Neurological: She is alert and oriented to person, place, and time.  Skin: Skin is warm.  Psychiatric: She has a normal mood and affect. Her behavior is normal. Judgment and  thought content normal.    Assessment & Plan:  Neoplasm of left breast, primary tumor staging category Tis  Acquired absence of bilateral breasts and nipples  S/P mastectomy, bilateral  S/P breast reconstruction, bilateral I encouraged her to continue either oncology or PCP for yearly exams.  For MRIs every 2 to 3 years. At this time appear to be a need for it.  She would be a good candidate for nipple areole reconstruction with tattoo.  We will plan on that and apply for insurance coverage for preauthorization.  [place], DO

## 2018-07-26 ENCOUNTER — Ambulatory Visit: Payer: BC Managed Care – PPO | Admitting: Family

## 2018-08-02 ENCOUNTER — Inpatient Hospital Stay: Payer: BC Managed Care – PPO

## 2018-08-02 ENCOUNTER — Other Ambulatory Visit: Payer: Self-pay

## 2018-08-02 ENCOUNTER — Inpatient Hospital Stay: Payer: BC Managed Care – PPO | Attending: Hematology & Oncology | Admitting: Hematology & Oncology

## 2018-08-02 ENCOUNTER — Encounter: Payer: Self-pay | Admitting: Hematology & Oncology

## 2018-08-02 VITALS — BP 127/77 | HR 101 | Temp 98.2°F | Resp 16 | Wt 221.8 lb

## 2018-08-02 DIAGNOSIS — G8929 Other chronic pain: Secondary | ICD-10-CM

## 2018-08-02 DIAGNOSIS — Z421 Encounter for breast reconstruction following mastectomy: Secondary | ICD-10-CM | POA: Diagnosis not present

## 2018-08-02 DIAGNOSIS — Z9013 Acquired absence of bilateral breasts and nipples: Secondary | ICD-10-CM | POA: Insufficient documentation

## 2018-08-02 DIAGNOSIS — D0502 Lobular carcinoma in situ of left breast: Secondary | ICD-10-CM | POA: Insufficient documentation

## 2018-08-02 DIAGNOSIS — Z79899 Other long term (current) drug therapy: Secondary | ICD-10-CM | POA: Diagnosis not present

## 2018-08-02 DIAGNOSIS — R0789 Other chest pain: Principal | ICD-10-CM

## 2018-08-02 LAB — CBC WITH DIFFERENTIAL (CANCER CENTER ONLY)
Abs Immature Granulocytes: 0.01 10*3/uL (ref 0.00–0.07)
Basophils Absolute: 0.1 10*3/uL (ref 0.0–0.1)
Basophils Relative: 1 %
Eosinophils Absolute: 0.1 10*3/uL (ref 0.0–0.5)
Eosinophils Relative: 3 %
HCT: 44.2 % (ref 36.0–46.0)
Hemoglobin: 14.1 g/dL (ref 12.0–15.0)
Immature Granulocytes: 0 %
Lymphocytes Relative: 44 %
Lymphs Abs: 2.2 10*3/uL (ref 0.7–4.0)
MCH: 28.5 pg (ref 26.0–34.0)
MCHC: 31.9 g/dL (ref 30.0–36.0)
MCV: 89.5 fL (ref 80.0–100.0)
MONO ABS: 0.4 10*3/uL (ref 0.1–1.0)
Monocytes Relative: 9 %
Neutro Abs: 2.1 10*3/uL (ref 1.7–7.7)
Neutrophils Relative %: 43 %
PLATELETS: 307 10*3/uL (ref 150–400)
RBC: 4.94 MIL/uL (ref 3.87–5.11)
RDW: 12.5 % (ref 11.5–15.5)
WBC: 5 10*3/uL (ref 4.0–10.5)
nRBC: 0 % (ref 0.0–0.2)

## 2018-08-02 LAB — LACTATE DEHYDROGENASE: LDH: 182 U/L (ref 98–192)

## 2018-08-02 LAB — CMP (CANCER CENTER ONLY)
ALBUMIN: 4.5 g/dL (ref 3.5–5.0)
ALK PHOS: 85 U/L (ref 38–126)
ALT: 14 U/L (ref 0–44)
ANION GAP: 9 (ref 5–15)
AST: 13 U/L — ABNORMAL LOW (ref 15–41)
BILIRUBIN TOTAL: 0.5 mg/dL (ref 0.3–1.2)
BUN: 11 mg/dL (ref 6–20)
CALCIUM: 9.5 mg/dL (ref 8.9–10.3)
CO2: 28 mmol/L (ref 22–32)
Chloride: 100 mmol/L (ref 98–111)
Creatinine: 0.8 mg/dL (ref 0.44–1.00)
GLUCOSE: 84 mg/dL (ref 70–99)
POTASSIUM: 3.8 mmol/L (ref 3.5–5.1)
Sodium: 137 mmol/L (ref 135–145)
TOTAL PROTEIN: 7.5 g/dL (ref 6.5–8.1)

## 2018-08-02 NOTE — Progress Notes (Signed)
Hematology and Oncology Follow Up Visit  [name] [id number] 1965/11/09 53 y.o. 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  She is under a lot of stress right now.  Her father passed away in 2023-07-01.  I know this is been tough on her.  She was very much involved with his care.  She now is helping out with her mom.  It sounds like that in the springtime, she and her family will go up to [place] to see [employer].  I know this will be a wonderful experience for them.  She is doing okay otherwise.  She is having a little bit of discomfort with the implants that she had.  She sees the surgeon again.  Sound like she may have tattoos done so that she will have more anatomically correct implants.  She has had no issues with her thyroid.  Her weight is up a little bit.  We do have her off the Megace.  I thought this would be helpful with weight loss.  Her last vitamin D level back in July was 35.  She has had no change in bowel or bladder habits.  She has had no rashes.  There has been no leg swelling.  Overall, her performance status is ECOG 1.    Medications:  Current Outpatient Medications:  .  citalopram (CELEXA) 40 MG tablet, Take 40 mg by mouth daily., Disp: , Rfl:  .  Calcium Carbonate-Vitamin D (CALCIUM + D PO), Take 2 tablets by mouth daily. , Disp: , Rfl:  .  cholecalciferol (VITAMIN D) 1000 units tablet, Take 1,000 Units by mouth daily. , Disp: , Rfl:  .  KRILL OIL OMEGA-3 PO, Take 1 tablet by mouth daily. , Disp: , Rfl:  .  levothyroxine (SYNTHROID, LEVOTHROID) 125 MCG tablet, Take 125 mcg by mouth daily before breakfast., Disp: , Rfl:  .  magnesium oxide (MAG-OX) 400 MG tablet, Take 400 mg by mouth daily., Disp: , Rfl:  .  Multiple Vitamin (MULTIVITAMIN) tablet, Take 1 tablet by mouth daily., Disp: , Rfl:  .   nortriptyline (PAMELOR) 10 MG capsule, Take 3 capsules (30 mg total) by mouth at bedtime., Disp: 90 capsule, Rfl: 11 .  pantoprazole (PROTONIX) 40 MG tablet, TAKE 1 TABLET(40 MG) BY MOUTH TWICE DAILY, Disp: 60 tablet, Rfl: 5 .  Riboflavin 400 MG TABS, Take by mouth., Disp: , Rfl:  .  rizatriptan (MAXALT-MLT) 10 MG disintegrating tablet, Take 1 tablet (10 mg total) by mouth as needed for migraine. May repeat in 2 hours if needed, Disp: 15 tablet, Rfl: 11 .  SUMAtriptan 6 MG/0.5ML SOAJ, Inject 6 mg into the skin as needed., Disp: 12 Cartridge, Rfl: 11 .  tiZANidine (ZANAFLEX) 4 MG tablet, Take 1 tablet (4 mg total) by mouth every 6 (six) hours as needed for muscle spasms., Disp: 30 tablet, Rfl: 6 .  vitamin E (VITAMIN E) 1000 UNIT capsule, Take 1,000 Units by mouth daily., Disp: , Rfl:  .  vitamin E 1000 UNIT capsule, Take 1,000 Units by mouth daily., Disp: , Rfl:   Allergies:  Allergies  Allergen Reactions  . Penicillins Rash    Past Medical History, Surgical history, Social history, and Family History were reviewed and updated.  Review of Systems: Review of Systems  All other systems reviewed and are negative.  Physical Exam:  weight is 221 lb 12 oz (100.6 kg). Her oral temperature is 98.2 F (36.8 C). Her blood pressure is 127/77 and her pulse is 101 (abnormal). Her respiration is 16 and oxygen saturation is 100%.   Wt Readings from Last 3 Encounters:  08/02/18 221 lb 12 oz (100.6 kg)  07/06/18 228 lb (103.4 kg)  05/26/18 220 lb (99.8 kg)     Physical Exam Vitals signs reviewed.  Constitutional:      Comments: Breast exam shows bilateral mastectomies.  She has implants.  Everything looks well-healed.  She has no adenopathy in the axilla bilaterally.  HENT:     Head: Normocephalic and atraumatic.  Eyes:     Pupils: Pupils are equal, round, and reactive to light.  Neck:     Musculoskeletal: Normal range of motion.  Cardiovascular:     Rate and Rhythm: Normal rate and  regular rhythm.     Heart sounds: Normal heart sounds.  Pulmonary:     Effort: Pulmonary effort is normal.     Breath sounds: Normal breath sounds.  Abdominal:     General: Bowel sounds are normal.     Palpations: Abdomen is soft.  Musculoskeletal: Normal range of motion.        General: No tenderness or deformity.  Lymphadenopathy:     Cervical: No cervical adenopathy.  Skin:    General: Skin is warm and dry.     Findings: No erythema or rash.  Neurological:     Mental Status: She is alert and oriented to person, place, and time.  Psychiatric:        Behavior: Behavior normal.        Thought Content: Thought content normal.        Judgment: Judgment normal.     Lab Results  Component Value Date   WBC 6.1 02/01/2018   HGB 14.3 02/01/2018   HCT 42.5 02/01/2018   MCV 90.2 02/01/2018   PLT 296 02/01/2018     Chemistry      Component Value Date/Time   NA 137 02/01/2018 0806   NA 139 01/29/2017 0747   NA 138 05/30/2016 0746   K 4.3 02/01/2018 0806   K 3.8 01/29/2017 0747   K 3.8 05/30/2016 0746   CL 99 02/01/2018 0806   CL 105 01/29/2017 0747   CO2 30 02/01/2018 0806   CO2 30 01/29/2017 0747   CO2 25 05/30/2016 0746   BUN 16 02/01/2018 0806   BUN 7 01/29/2017 0747   BUN 10.0 05/30/2016 0746   CREATININE 0.93 02/01/2018 0806   CREATININE 0.8 01/29/2017 0747   CREATININE 0.7 05/30/2016 0746      Component Value Date/Time   CALCIUM 10.2 02/01/2018 0806   CALCIUM 9.3 01/29/2017 0747   CALCIUM 9.2 05/30/2016 0746   ALKPHOS 71 02/01/2018 0806   ALKPHOS 70 01/29/2017 0747   ALKPHOS 71 05/30/2016 0746   AST 20 02/01/2018 0806   AST 16 05/30/2016 0746   ALT 22 02/01/2018 0806   ALT 18 01/29/2017 0747   ALT 11 05/30/2016 0746   BILITOT 0.4 02/01/2018 0806   BILITOT 0.83 05/30/2016 0746         Impression and Plan: [name] is a 53 year old white female. She had ductal carcinoma in situ of the left breast. This was multifocal. She underwent bilateral  mastectomies.  I forgot to mention that physical therapy really helped her out with the discomfort that she had with the implants.  She  is very happy that she did physical therapy.  We will plan to get her back in another 6 months.    [name], MD 1/6/20208:29 AM

## 2018-08-03 LAB — LUTEINIZING HORMONE: LH: 42.1 m[IU]/mL

## 2018-08-03 LAB — FOLLICLE STIMULATING HORMONE: FSH: 101.3 m[IU]/mL

## 2018-08-04 LAB — ESTRADIOL, ULTRA SENS: ESTRADIOL, SENSITIVE: 4.3 pg/mL

## 2018-08-09 ENCOUNTER — Encounter: Payer: Self-pay | Admitting: Family

## 2018-08-09 ENCOUNTER — Ambulatory Visit (INDEPENDENT_AMBULATORY_CARE_PROVIDER_SITE_OTHER): Payer: BC Managed Care – PPO | Admitting: Family

## 2018-08-09 VITALS — BP 131/75 | HR 93 | Temp 98.5°F | Resp 16 | Ht 67.0 in | Wt 224.0 lb

## 2018-08-09 DIAGNOSIS — F32A Depression, unspecified: Secondary | ICD-10-CM

## 2018-08-09 DIAGNOSIS — G43909 Migraine, unspecified, not intractable, without status migrainosus: Secondary | ICD-10-CM

## 2018-08-09 DIAGNOSIS — F329 Major depressive disorder, single episode, unspecified: Secondary | ICD-10-CM

## 2018-08-09 DIAGNOSIS — R232 Flushing: Secondary | ICD-10-CM

## 2018-08-09 NOTE — Progress Notes (Signed)
Subjective:    Patient ID: [name], female    DOB: 02-21-1966, 53 y.o.   MRN: [id number]  HPI  Patient is a 53 yr old female who presents today for follow up.   Depression- last visit lexapro was changed to citalopram. Due to hot flashes. See below. Dad passed on 06-25-2023.  She has been coping well because she feels like she "had been saying goodbye for 10 years" due to his dementia.  Hot flashes- hot flashes continue despite transition to citalopram.   Migraines- continues nortriptyline, magnesium and b vitamin.  Notes no migraine in 10 days.  This is unusual for her.  Review of Systems See HPI  Past Medical History:  Diagnosis Date  . Abrasion of right arm 12/25/2016  . Complication of anesthesia    panic attack while waking up  . History of breast cancer 2014  . Hypothyroidism   . Migraines   . PONV (postoperative nausea and vomiting)      Social History   Socioeconomic History  . Marital status: Married    Spouse name: [name]  . Number of children: 0  . Years of education: Masters  . Highest education level: Not on file  Occupational History  . Occupation: Medical Records  Social Needs  . Financial resource strain: Not on file  . Food insecurity:    Worry: Not on file    Inability: Not on file  . Transportation needs:    Medical: Not on file    Non-medical: Not on file  Tobacco Use  . Smoking status: Never Smoker  . Smokeless tobacco: Never Used  Substance and Sexual Activity  . Alcohol use: No    Alcohol/week: 0.0 standard drinks  . Drug use: No  . Sexual activity: Yes  Lifestyle  . Physical activity:    Days per week: Not on file    Minutes per session: Not on file  . Stress: Not on file  Relationships  . Social connections:    Talks on phone: Not on file    Gets together: Not on file    Attends religious service: Not on file    Active member of club or organization: Not on file    Attends meetings of clubs or organizations: Not on file   Relationship status: Not on file  . Intimate partner violence:    Fear of current or ex partner: Not on file    Emotionally abused: Not on file    Physically abused: Not on file    Forced sexual activity: Not on file  Other Topics Concern  . Not on file  Social History Narrative   Lives at home with husband.   Right-handed.   2-3 cups caffeine daily.   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 BREAST LUMPECTOMY WITH NEEDLE LOCALIZATION Left 06/20/2013   Procedure: BREAST LUMPECTOMY WITH NEEDLE LOCALIZATION;  Surgeon: Merrie Roof, MD;  Location: [facility];  Service: General;  Laterality: Left;  . BREAST RECONSTRUCTION Left 04/09/2017   Procedure: REVISION BREAST RECONSTRUCTION;  Surgeon: Wallace Going, DO;  Location: [facility];  Service: Plastics;  Laterality: Left;  . LAPAROSCOPIC VAGINAL HYSTERECTOMY WITH SALPINGO OOPHORECTOMY Bilateral 06/17/2016   Procedure: LAPAROSCOPIC ASSISTED VAGINAL HYSTERECTOMY WITH SALPINGO OOPHORECTOMY;  Surgeon: Arvella Nigh, MD;  Location: [facility];  Service: Gynecology;  Laterality: Bilateral;  need bed  . LIPOSUCTION WITH LIPOFILLING Left 04/09/2017   Procedure: LIPOFILLING TO THE LEFT UPPER BREAST TO IMPROVE SYMMETRY.;  Surgeon: Wallace Going, DO;  Location: [facility];  Service: Plastics;  Laterality: Left;  Marland Kitchen MASTECTOMY W/ SENTINEL NODE BIOPSY Bilateral 09/08/2016   Procedure: LEFT MASTECTOMY WITH LEFT SENTINEL LYMPH NODE BIOPSY, RIGHT PROPHYLACTIC MASTECTOMY;  Surgeon: Autumn Messing III, MD;  Location: [facility];  Service: General;  Laterality: Bilateral;  . PALATE TO GINGIVA GRAFT    . REMOVAL OF BILATERAL TISSUE EXPANDERS WITH PLACEMENT OF BILATERAL BREAST IMPLANTS Bilateral 01/01/2017   Procedure: REMOVAL OF BILATERAL TISSUE EXPANDERS WITH  PLACEMENT OF BILATERAL SILICONE BREAST IMPLANTS WITH LIPOSUCTION;  Surgeon: Wallace Going, DO;  Location: [facility];  Service: Plastics;  Laterality: Bilateral;  . TISSUE EXPANDER PLACEMENT Bilateral 09/08/2016   Procedure: IMMEDIATE PLACEMENT OF BILATERAL TISSUE EXPANDERS AFTER MASTECTOMIES;  Surgeon: Loel Lofty Dillingham, DO;  Location: [facility];  Service: Plastics;  Laterality: Bilateral;    Family History  Problem Relation Age of Onset  . Breast cancer Mother 33  . Heart disease Mother        Atrial fibrillation  . Melanoma Father 89  . COPD Father   . Arthritis Father   . Breast cancer Sister 2       ER+/her2-; onco score = 8  . Cancer Maternal Aunt        oral cancer; smoker  . Head & neck cancer Paternal Uncle        smoker  . Breast cancer Cousin        dx in her late 57s-60s    Allergies  Allergen Reactions  . Penicillins Rash    Current Outpatient Medications on File Prior to Visit  Medication Sig Dispense Refill  . Calcium Carbonate-Vitamin D (CALCIUM + D PO) Take 2 tablets by mouth daily.     . cholecalciferol (VITAMIN D) 1000 units tablet Take 1,000 Units by mouth daily.     . citalopram (CELEXA) 40 MG tablet Take 40 mg by mouth daily.    Marland Kitchen KRILL OIL OMEGA-3 PO Take 1 tablet by mouth daily.     Marland Kitchen levothyroxine (SYNTHROID, LEVOTHROID) 125 MCG tablet Take 125 mcg by mouth daily before breakfast.    . magnesium oxide (MAG-OX) 400 MG tablet Take 400 mg by mouth daily.    . Multiple Vitamin (MULTIVITAMIN) tablet Take 1 tablet by mouth daily.    . nortriptyline (PAMELOR) 10 MG capsule Take 3 capsules (30 mg total) by mouth at bedtime. 90 capsule 11  . pantoprazole (PROTONIX) 40 MG tablet TAKE 1 TABLET(40 MG) BY MOUTH TWICE DAILY 60 tablet 5  . Riboflavin 400 MG TABS Take by mouth.    . rizatriptan (MAXALT-MLT) 10 MG disintegrating tablet Take 1 tablet (10 mg total) by mouth as needed for migraine. May repeat in 2 hours if needed 15 tablet 11  .  SUMAtriptan 6 MG/0.5ML SOAJ Inject 6 mg into the skin as needed. 12 Cartridge 11  . tiZANidine (ZANAFLEX) 4 MG tablet Take 1 tablet (4 mg total) by mouth every 6 (six) hours as needed for muscle spasms. 30 tablet 6  . vitamin E (VITAMIN E) 1000 UNIT capsule Take 1,000 Units by mouth daily.    . vitamin E 1000 UNIT capsule Take 1,000 Units by mouth daily.     No current facility-administered medications on file prior to visit.  BP 131/75 (BP Location: Right Arm, Patient Position: Sitting, Cuff Size: Small)   Pulse 93   Temp 98.5 F (36.9 C) (Oral)   Resp 16   Ht '5\' 7"'  (1.702 m)   Wt 224 lb (101.6 kg)   LMP 02/01/2014   SpO2 100%   BMI 35.08 kg/m       Objective:   Physical Exam Constitutional:      Appearance: She is well-developed.  Neck:     Musculoskeletal: Neck supple.     Thyroid: No thyromegaly.  Cardiovascular:     Rate and Rhythm: Normal rate and regular rhythm.     Heart sounds: Normal heart sounds. No murmur.  Pulmonary:     Effort: Pulmonary effort is normal. No respiratory distress.     Breath sounds: Normal breath sounds. No wheezing.  Skin:    General: Skin is warm and dry.  Neurological:     Mental Status: She is alert and oriented to person, place, and time.  Psychiatric:        Behavior: Behavior normal.        Thought Content: Thought content normal.        Judgment: Judgment normal.           Assessment & Plan:  Depression- stable on citalopram. Continue same.  Hot flashes- unchanged. Unfortunately we cannot give her any HRT due to her hx of breast CA.  Monitor.  Migraines- recently stable. Continue current meds.

## 2018-08-25 DIAGNOSIS — H3321 Serous retinal detachment, right eye: Secondary | ICD-10-CM

## 2018-08-25 HISTORY — PX: RETINAL DETACHMENT SURGERY: SHX105

## 2018-08-25 HISTORY — DX: Serous retinal detachment, right eye: H33.21

## 2018-08-25 IMAGING — MG MM CLIP PLACEMENT
3 series · 3 of 3 positions shown · non-contrast
Comparison: Previous exam(s).

CLINICAL DATA: Status post MRI guided biopsies of 2 areas of non
mass enhancement in the left breast

EXAM:
DIAGNOSTIC LEFT MAMMOGRAM POST MRI BIOPSY

[L ML]
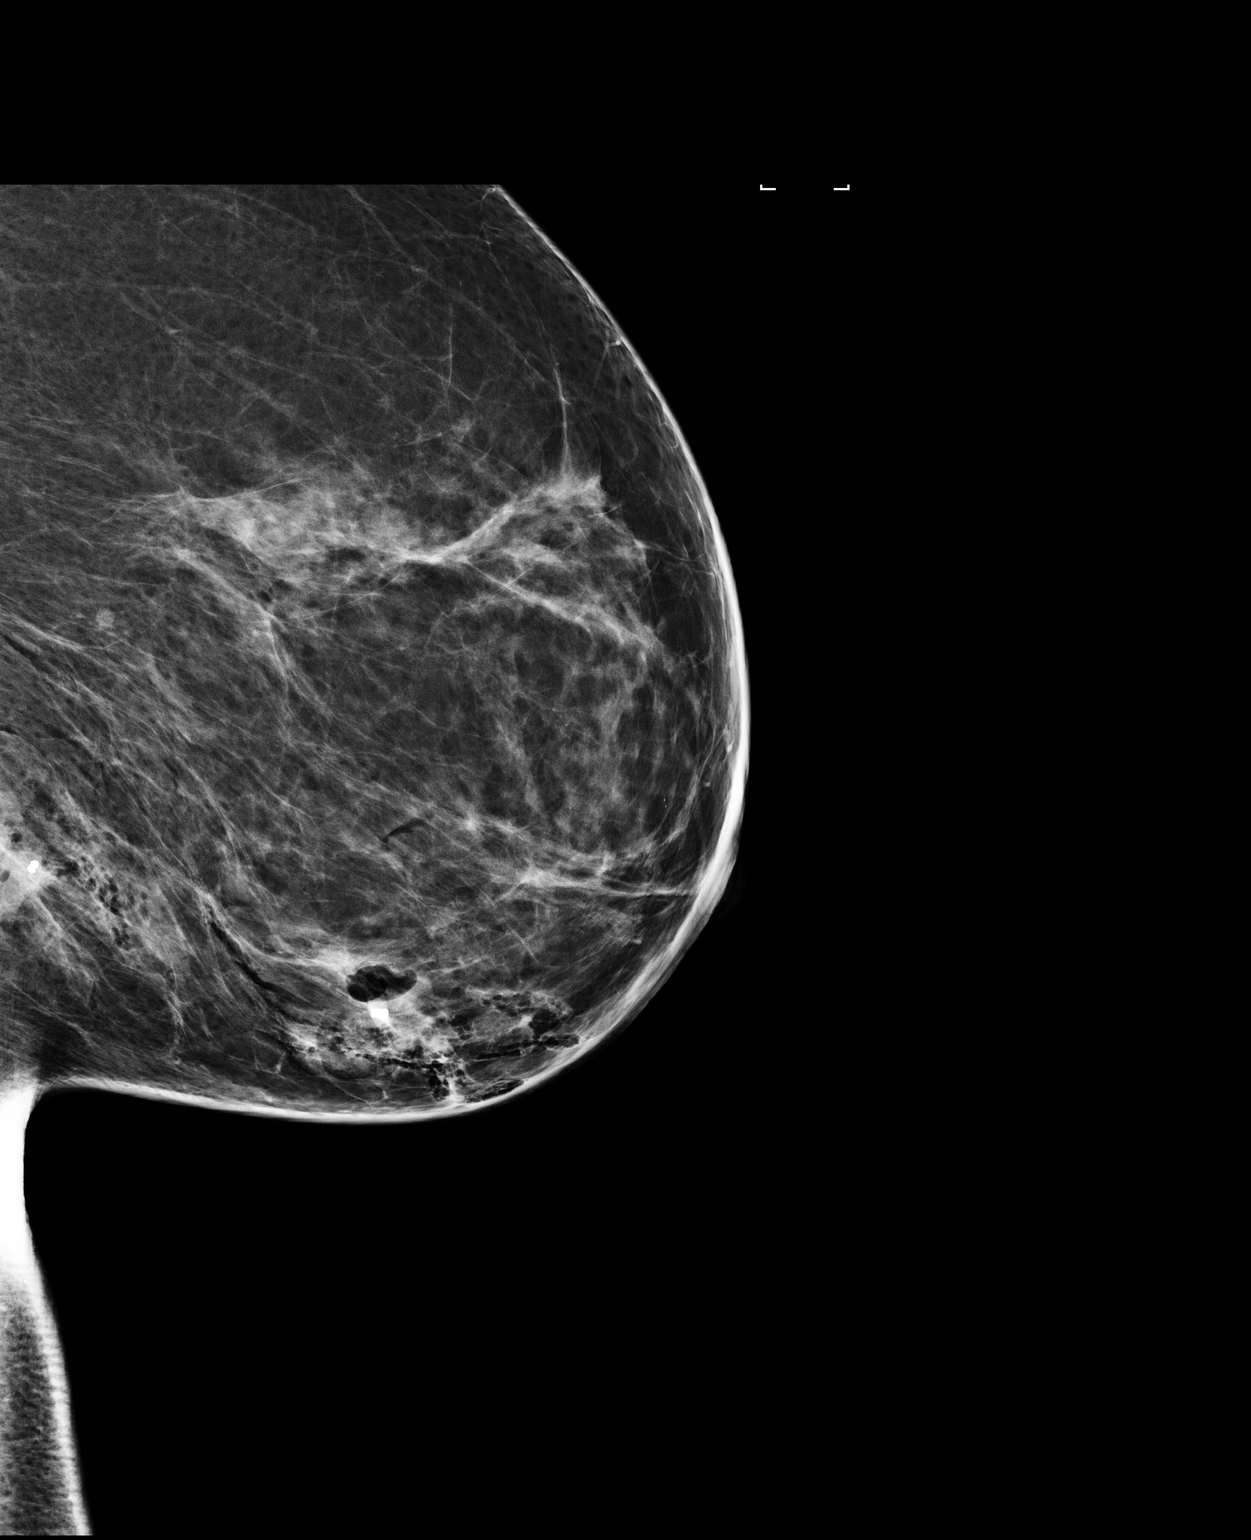

[L CC (1 of 2)]
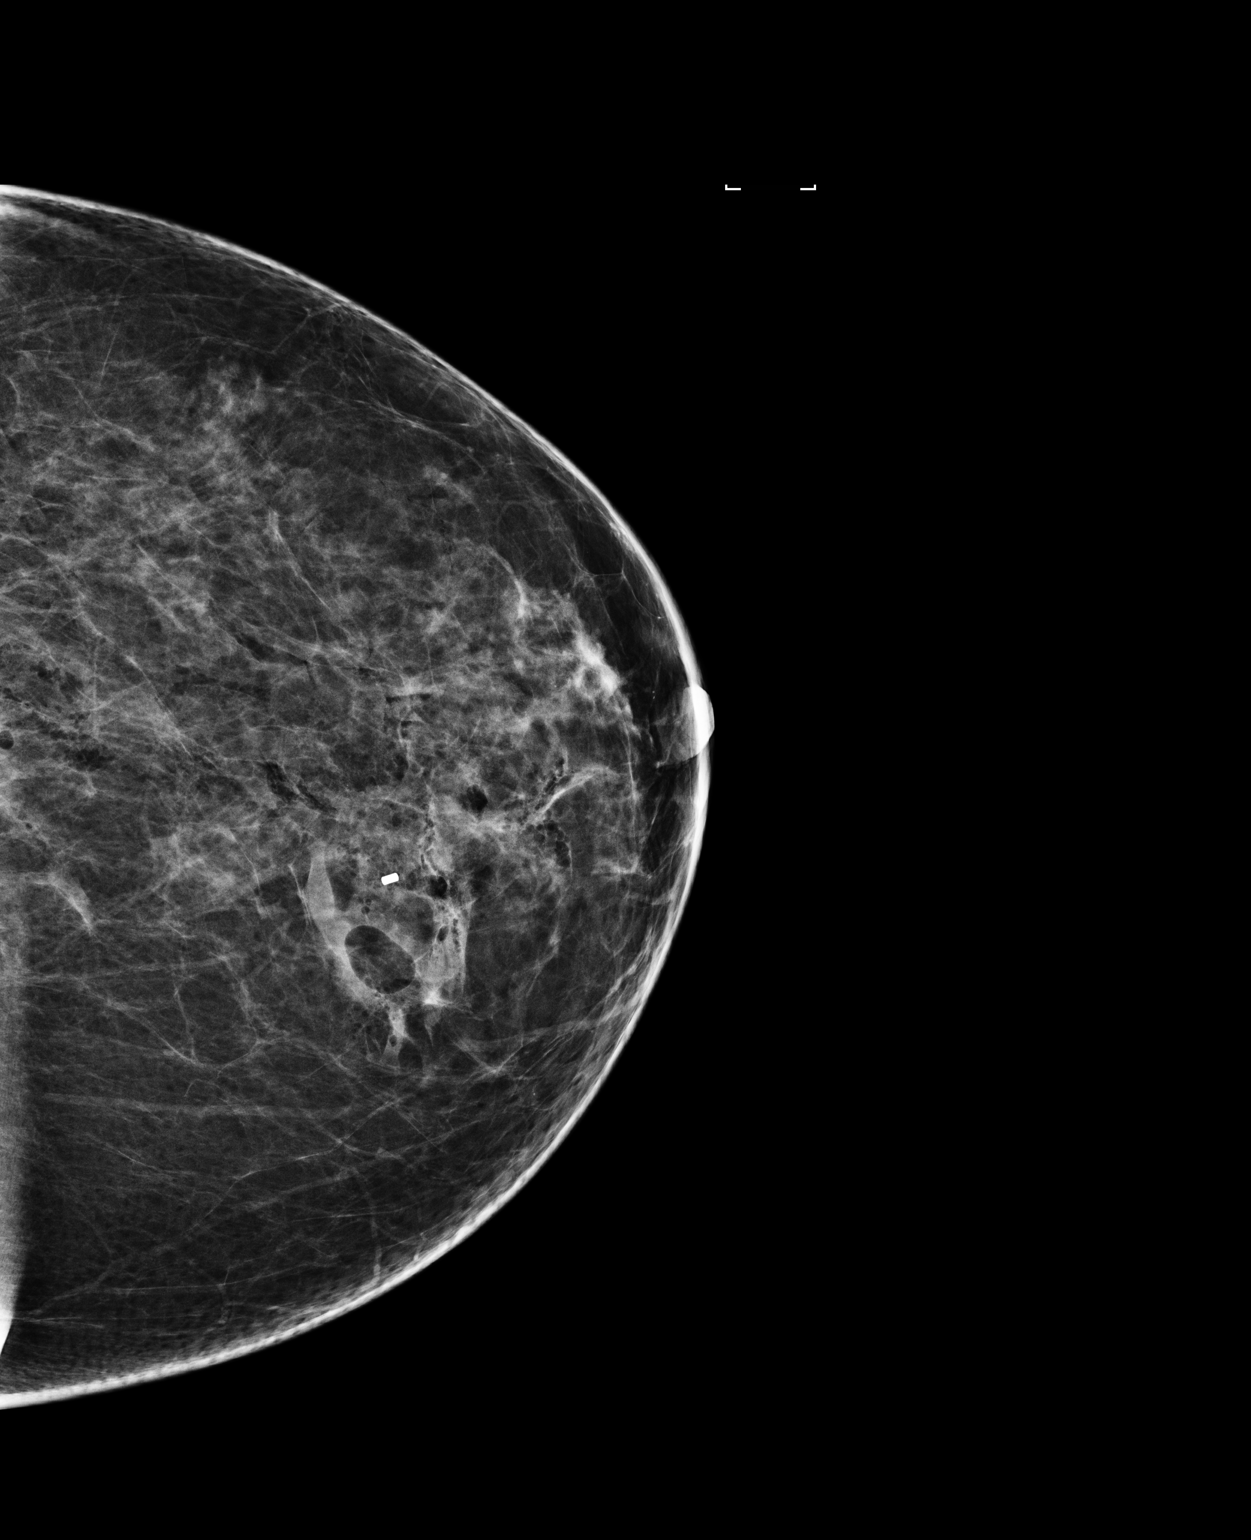

[L CC (2 of 2)]
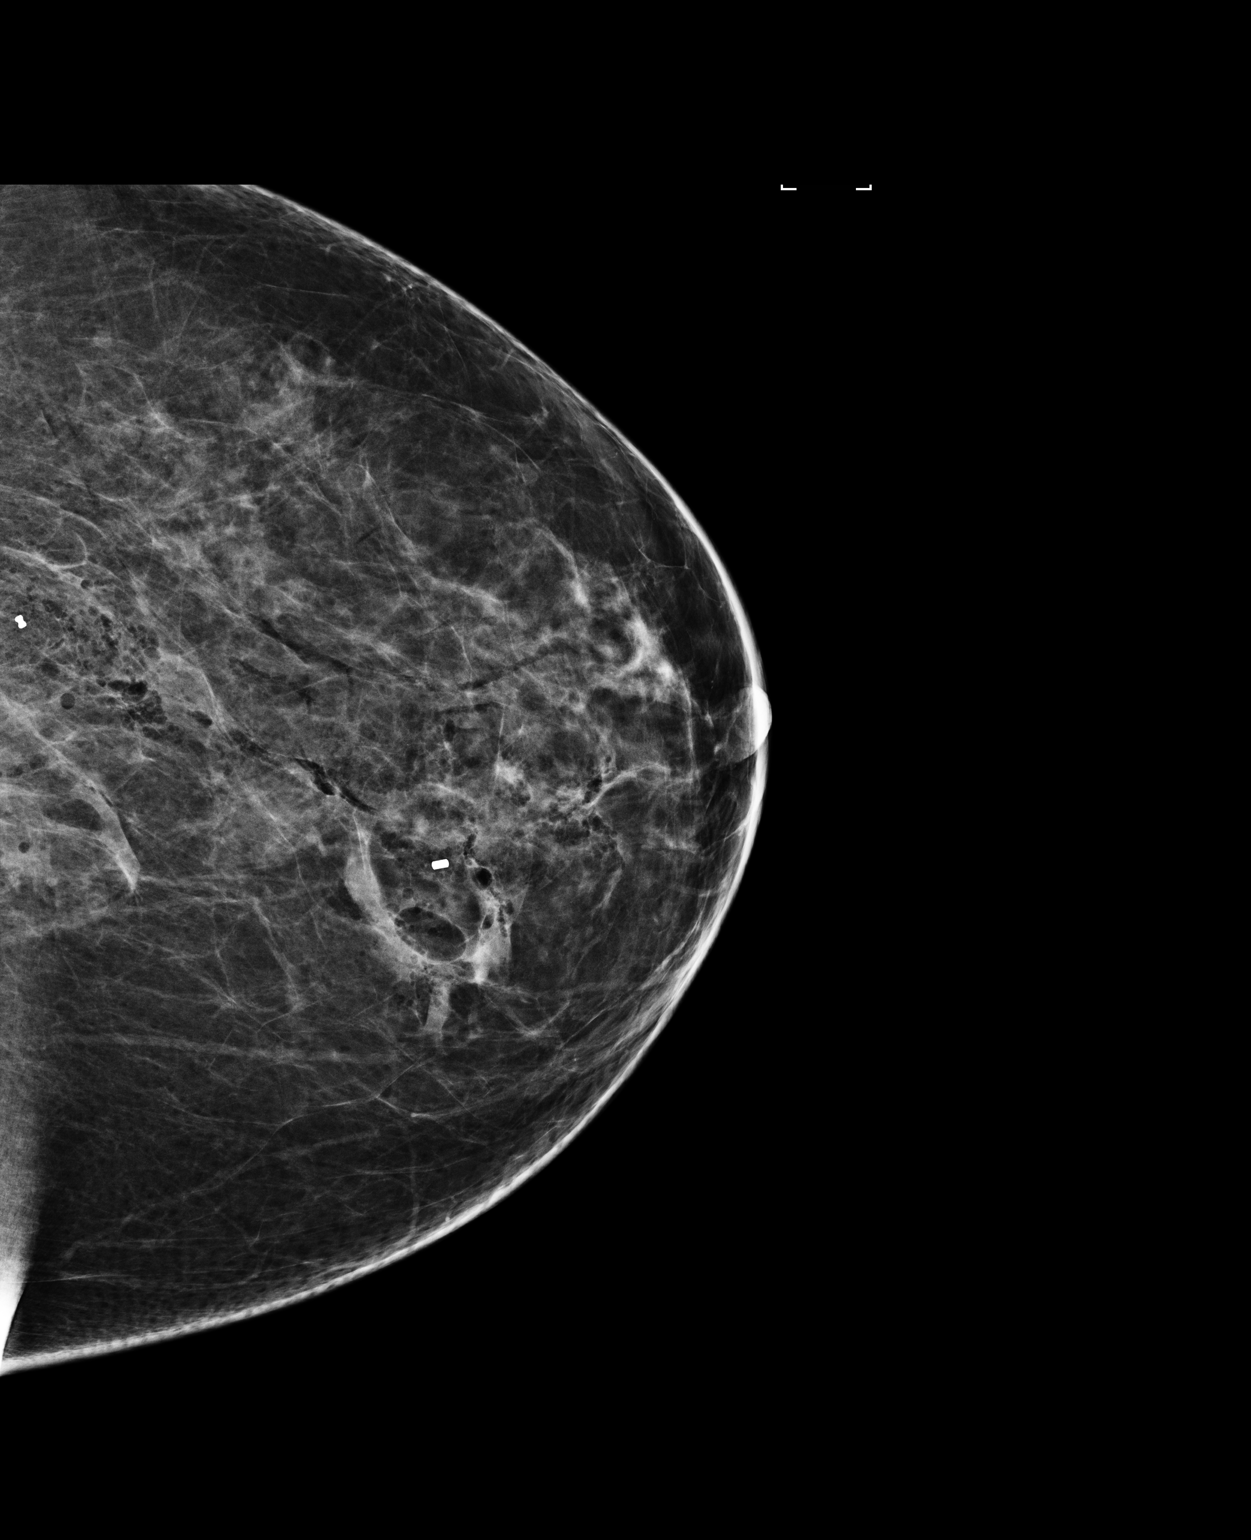

[3 of 3 positions shown; findings below may reference images not displayed]

FINDINGS: Mammographic images were obtained following MRI guided biopsies of 2
areas of non mass enhancement in the left breast. Post biopsy
mammogram demonstrates the dumbbell-shaped biopsy marker to be in
the expected location within the posterior lower, slightly outer
left breast. The cylinder-shaped biopsy marker is in the expected
location in the anterior, lower, slightly inner left breast.
IMPRESSION: Appropriate marker position as above.

Final Assessment: Post Procedure Mammograms for Marker Placement

## 2018-09-23 ENCOUNTER — Ambulatory Visit (INDEPENDENT_AMBULATORY_CARE_PROVIDER_SITE_OTHER): Payer: BC Managed Care – PPO | Admitting: Neurology

## 2018-09-23 ENCOUNTER — Encounter: Payer: Self-pay | Admitting: Neurology

## 2018-09-23 VITALS — BP 117/75 | HR 88 | Ht 67.5 in | Wt 223.0 lb

## 2018-09-23 DIAGNOSIS — G43711 Chronic migraine without aura, intractable, with status migrainosus: Secondary | ICD-10-CM

## 2018-09-23 MED ORDER — ERENUMAB-AOOE 140 MG/ML ~~LOC~~ SOAJ: 140.0000 mg | SUBCUTANEOUS | 11 refills | Status: DC

## 2018-09-23 MED ORDER — METOCLOPRAMIDE HCL 10 MG PO TABS: 10.0000 mg | ORAL_TABLET | Freq: Three times a day (TID) | ORAL | 11 refills | Status: DC | PRN

## 2018-09-23 NOTE — Progress Notes (Signed)
[facility]    Provider:  Dr Jaynee Eagles Referring Provider: Dr. Krista Blue Primary Care Provider:  Debbrah Alar, NP  CC:  migraines  HPI:  [name] is a 53 y.o. female here as requested by provider Debbrah Alar, NP for migraines. She has tried nortriptyline, maxalt, zofran, magnesium and riboflavin, celexa, tizanidine. At least 18 headache days a month, all are migrainous and moderately-severe to severe. Ongoing for over a year at this severity and frequency.Migraines can last 24-72 hours. +nausea, +vomiting, severe and pounding, +light and sound sensitivity, starts on the left, movement makes it worse. Started in 1994. Started worsening in 48s. Currently no medication overuse.    Review of Systems: Patient complains of symptoms per HPI as well as the following symptoms: headache. Pertinent negatives and positives per HPI. All others negative.   Social History   Socioeconomic History  . Marital status: Married    Spouse name: [name]  . Number of children: 0  . Years of education: Masters  . Highest education level: Not on file  Occupational History  . Occupation: Medical Records  Social Needs  . Financial resource strain: Not on file  . Food insecurity:    Worry: Not on file    Inability: Not on file  . Transportation needs:    Medical: Not on file    Non-medical: Not on file  Tobacco Use  . Smoking status: Never Smoker  . Smokeless tobacco: Never Used  Substance and Sexual Activity  . Alcohol use: No    Alcohol/week: 0.0 standard drinks  . Drug use: No  . Sexual activity: Yes  Lifestyle  . Physical activity:    Days per week: Not on file    Minutes per session: Not on file  . Stress: Not on file  Relationships  . Social connections:    Talks on phone: Not on file    Gets together: Not on file    Attends religious service: Not on file    Active member of club or organization: Not on file    Attends meetings of clubs or organizations: Not  on file    Relationship status: Not on file  . Intimate partner violence:    Fear of current or ex partner: Not on file    Emotionally abused: Not on file    Physically abused: Not on file    Forced sexual activity: Not on file  Other Topics Concern  . Not on file  Social History Narrative   Lives at home with husband.   Right-handed.   2-3 cups caffeine daily.   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 Breast cancer Mother 87  . Heart disease Mother        Atrial fibrillation  . Melanoma Father 12  . COPD Father   . Arthritis Father   . Dementia Father   . Pneumonia Father   . Breast cancer Sister 57       ER+/her2-; onco score = 8  . Cancer Maternal Aunt        oral cancer; smoker  . Head & neck cancer Paternal Uncle        smoker  . Breast cancer Cousin        dx in her late 80s-60s    Past Medical History:  Diagnosis Date  .  Abrasion of right arm 12/25/2016  . Complication of anesthesia    panic attack while waking up  . Detached retina, right 08/25/2018  . History of breast cancer 2014  . Hypothyroidism   . Migraines   . PONV (postoperative nausea and vomiting)     Patient Active Problem List   Diagnosis Date Noted  . Chronic migraine without aura, with intractable migraine, so stated, with status migrainosus 09/23/2018  . S/P mastectomy, bilateral 07/06/2018  . S/P breast reconstruction, bilateral 07/06/2018  . Gastroesophageal reflux disease 12/31/2017  . Hypothyroidism 12/31/2017  . History of breast cancer 12/31/2017  . Hot flashes 12/31/2017  . Depression 12/31/2017  . Other osteoporosis without current pathological fracture 01/29/2017  . Acquired absence of bilateral breasts and nipples 09/23/2016  . Breast cancer ([place]) 09/08/2016  . 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 S/P laparoscopic  assisted vaginal hysterectomy (LAVH) 06/17/2016  . Chronic migraine 12/10/2015  . Chest pain 02/10/2014  . Myalgia 02/10/2014  . Hot flashes due to tamoxifen 02/10/2014  . Neoplasm of left breast, primary tumor staging category Tis 05/19/2013    Past Surgical History:  Procedure Laterality Date  . BREAST LUMPECTOMY WITH NEEDLE LOCALIZATION Left 06/20/2013   Procedure: BREAST LUMPECTOMY WITH NEEDLE LOCALIZATION;  Surgeon: Merrie Roof, MD;  Location: [place];  Service: General;  Laterality: Left;  . BREAST RECONSTRUCTION Left 04/09/2017   Procedure: REVISION BREAST RECONSTRUCTION;  Surgeon: Wallace Going, DO;  Location: [place];  Service: Plastics;  Laterality: Left;  . LAPAROSCOPIC VAGINAL HYSTERECTOMY WITH SALPINGO OOPHORECTOMY Bilateral 06/17/2016   Procedure: LAPAROSCOPIC ASSISTED VAGINAL HYSTERECTOMY WITH SALPINGO OOPHORECTOMY;  Surgeon: Arvella Nigh, MD;  Location: [place];  Service: Gynecology;  Laterality: Bilateral;  need bed  . LIPOSUCTION WITH LIPOFILLING Left 04/09/2017   Procedure: LIPOFILLING TO THE LEFT UPPER BREAST TO IMPROVE SYMMETRY.;  Surgeon: Wallace Going, DO;  Location: [place];  Service: Plastics;  Laterality: Left;  Marland Kitchen MASTECTOMY W/ SENTINEL NODE BIOPSY Bilateral 09/08/2016   Procedure: LEFT MASTECTOMY WITH LEFT SENTINEL LYMPH NODE BIOPSY, RIGHT PROPHYLACTIC MASTECTOMY;  Surgeon: Autumn Messing III, MD;  Location: [place];  Service: General;  Laterality: Bilateral;  . PALATE TO GINGIVA GRAFT    . REMOVAL OF BILATERAL TISSUE EXPANDERS WITH PLACEMENT OF BILATERAL BREAST IMPLANTS Bilateral 01/01/2017   Procedure: REMOVAL OF BILATERAL TISSUE EXPANDERS WITH PLACEMENT OF BILATERAL SILICONE BREAST IMPLANTS WITH LIPOSUCTION;  Surgeon: Wallace Going, DO;  Location: [place];  Service: Plastics;  Laterality: Bilateral;  . RETINAL DETACHMENT SURGERY Right 08/25/2018  . TISSUE  EXPANDER PLACEMENT Bilateral 09/08/2016   Procedure: IMMEDIATE PLACEMENT OF BILATERAL TISSUE EXPANDERS AFTER MASTECTOMIES;  Surgeon: Loel Lofty Dillingham, DO;  Location: [place];  Service: Plastics;  Laterality: Bilateral;    Current Outpatient Medications  Medication Sig Dispense Refill  . Calcium Carbonate-Vitamin D (CALCIUM + D PO) Take 2 tablets by mouth daily.     . cholecalciferol (VITAMIN D) 1000 units tablet Take 1,000 Units by mouth daily.     . citalopram (CELEXA) 40 MG tablet Take 40 mg by mouth daily.    Marland Kitchen levothyroxine (SYNTHROID, LEVOTHROID) 125 MCG tablet Take 125 mcg by mouth daily before breakfast.    . magnesium oxide (MAG-OX) 400 MG tablet Take 400 mg by mouth daily.    . Multiple Vitamin (MULTIVITAMIN) tablet Take 1 tablet by mouth daily.    . nortriptyline (PAMELOR) 10 MG  capsule Take 3 capsules (30 mg total) by mouth at bedtime. 90 capsule 11  . pantoprazole (PROTONIX) 40 MG tablet TAKE 1 TABLET(40 MG) BY MOUTH TWICE DAILY 60 tablet 5  . prednisoLONE acetate (PRED FORTE) 1 % ophthalmic suspension Place 1 drop into the right eye daily.    . Riboflavin 400 MG TABS Take by mouth.    . rizatriptan (MAXALT-MLT) 10 MG disintegrating tablet Take 1 tablet (10 mg total) by mouth as needed for migraine. May repeat in 2 hours if needed 15 tablet 11  . SUMAtriptan 6 MG/0.5ML SOAJ Inject 6 mg into the skin as needed. 12 Cartridge 11  . TIMOLOL MALEATE OP Place 1 drop into the right eye 2 (two) times daily.    Marland Kitchen tiZANidine (ZANAFLEX) 4 MG tablet Take 1 tablet (4 mg total) by mouth every 6 (six) hours as needed for muscle spasms. 30 tablet 6  . vitamin E 1000 UNIT capsule Take 1,000 Units by mouth daily.    Eduard Roux (AIMOVIG) 140 MG/ML SOAJ Inject 140 mg into the skin every 30 (thirty) days. 1 pen 11  . metoCLOPramide (REGLAN) 10 MG tablet Take 1 tablet (10 mg total) by mouth every 8 (eight) hours as needed for nausea. Or migraine 30 tablet 11   No current facility-administered  medications for this visit.     Allergies as of 09/23/2018 - Review Complete 09/23/2018  Allergen Reaction Noted  . Penicillins Rash 05/19/2013    Vitals: BP 117/75 (BP Location: Right Arm, Patient Position: Sitting)   Pulse 88   Ht 5' 7.5" (1.715 m)   Wt 223 lb (101.2 kg)   LMP 02/01/2014   BMI 34.41 kg/m  Last Weight:  Wt Readings from Last 1 Encounters:  09/23/18 223 lb (101.2 kg)   Last Height:   Ht Readings from Last 1 Encounters:  09/23/18 5' 7.5" (1.715 m)     Physical exam: Exam: Gen: NAD, conversant, well nourised, well groomed                     CV: RRR, no MRG. No Carotid Bruits. No peripheral edema, warm, nontender Eyes: Conjunctivae clear without exudates or hemorrhage  Neuro: Detailed Neurologic Exam  Speech:    Speech is normal; fluent and spontaneous with normal comprehension.  Cognition:    The patient is oriented to person, place, and time;     recent and remote memory intact;     language fluent;     normal attention, concentration,     fund of knowledge Cranial Nerves:    The pupils are equal, round, and reactive to light. The fundi are normal and spontaneous venous pulsations are present. Visual fields are full to finger confrontation. Extraocular movements are intact. Trigeminal sensation is intact and the muscles of mastication are normal. The face is symmetric. The palate elevates in the midline. Hearing intact. Voice is normal. Shoulder shrug is normal. The tongue has normal motion without fasciculations.   Coordination:    Normal finger to nose  Gait:    Heel-toe and tandem gait are normal.   Motor Observation:    No asymmetry, no atrophy, and no involuntary movements noted. Tone:    Normal muscle tone.    Posture:    Posture is normal. normal erect    Strength:    Strength is V/V in the upper and lower limbs.      Sensation: intact to LT     Reflex Exam:  DTR's:  Deep tendon reflexes in the upper and lower extremities  are normal bilaterally.   Toes:    The toes are downgoing bilaterally.   Clonus:    Clonus is absent.    Assessment/Plan:  Patient with chronic migraines failed multiple medications. Disussed options for patient.  Start Arnot - copay card At onset of migraine: Maxalt and Reglan (may need to tweak after about a month on Aimovig, will see how she responds. May need a longer acting triptan due to rebound however try Reglan with the mexalt and taking aimovig will make migraines easier to treat acutely as well. If doing well can slowly titrate off nortriptyline but email me first for directions  Discussed: To prevent or relieve headaches, try the following: Cool Compress. Lie down and place a cool compress on your head.  Avoid headache triggers. If certain foods or odors seem to have triggered your migraines in the past, avoid them. A headache diary might help you identify triggers.  Include physical activity in your daily routine. Try a daily walk or other moderate aerobic exercise.  Manage stress. Find healthy ways to cope with the stressors, such as delegating tasks on your to-do list.  Practice relaxation techniques. Try deep breathing, yoga, massage and visualization.  Eat regularly. Eating regularly scheduled meals and maintaining a healthy diet might help prevent headaches. Also, drink plenty of fluids.  Follow a regular sleep schedule. Sleep deprivation might contribute to headaches Consider biofeedback. With this mind-body technique, you learn to control certain bodily functions - such as muscle tension, heart rate and blood pressure - to prevent headaches or reduce headache pain.    Proceed to emergency room if you experience new or worsening symptoms or symptoms do not resolve, if you have new neurologic symptoms or if headache is severe, or for any concerning symptom.   Provided education and documentation from American headache Society toolbox including articles on: chronic  migraine medication overuse headache, chronic migraines, prevention of migraines, behavioral and other nonpharmacologic treatments for headache.  Cc: Debbrah Alar, NP,    A total of 30 minutes was spent face-to-face with this patient. Over half this time was spent on counseling patient on the  1. Chronic migraine without aura, with intractable migraine, so stated, with status migrainosus    diagnosis and different diagnostic and therapeutic options, counseling and coordination of care, risks ans benefits of management, compliance, or risk factor reduction and education.     Sarina Ill, MD  [facility] [address] [place], [place] [address]  Phone [phone] Fax [phone]

## 2018-09-23 NOTE — Patient Instructions (Addendum)
Start Erenumab - copay card At onset of migraine: Maxalt and Reglan  Erenumab injection What is this medicine? ERENUMAB (e REN ue mab) is used to prevent migraine headaches. This medicine may be used for other purposes; ask your health care provider or pharmacist if you have questions. COMMON BRAND NAME(S): Aimovig What should I tell my health care provider before I take this medicine? They need to know if you have any of these conditions: -an unusual or allergic reaction to erenumab, latex, other medicines, foods, dyes, or preservatives -pregnant or trying to get pregnant -breast-feeding How should I use this medicine? This medicine is for injection under the skin. You will be taught how to prepare and give this medicine. Use exactly as directed. Take your medicine at regular intervals. Do not take your medicine more often than directed. It is important that you put your used needles and syringes in a special sharps container. Do not put them in a trash can. If you do not have a sharps container, call your pharmacist or healthcare provider to get one. Talk to your pediatrician regarding the use of this medicine in children. Special care may be needed. Overdosage: If you think you have taken too much of this medicine contact a poison control center or emergency room at once. NOTE: This medicine is only for you. Do not share this medicine with others. What if I miss a dose? If you miss a dose, take it as soon as you can. If it is almost time for your next dose, take only that dose. Do not take double or extra doses. What may interact with this medicine? Interactions are not expected. This list may not describe all possible interactions. Give your health care provider a list of all the medicines, herbs, non-prescription drugs, or dietary supplements you use. Also tell them if you smoke, drink alcohol, or use illegal drugs. Some items may interact with your medicine. What should I watch for  while using this medicine? Tell your doctor or healthcare professional if your symptoms do not start to get better or if they get worse. What side effects may I notice from receiving this medicine? Side effects that you should report to your doctor or health care professional as soon as possible: -allergic reactions like skin rash, itching or hives, swelling of the face, lips, or tongue Side effects that usually do not require medical attention (report these to your doctor or health care professional if they continue or are bothersome): -constipation -muscle cramps -pain, redness, or irritation at site where injected This list may not describe all possible side effects. Call your doctor for medical advice about side effects. You may report side effects to FDA at 1-800-FDA-1088. Where should I keep my medicine? Keep out of the reach of children. You will be instructed on how to store this medicine. Throw away any unused medicine after the expiration date on the label. NOTE: This sheet is a summary. It may not cover all possible information. If you have questions about this medicine, talk to your doctor, pharmacist, or health care provider.  2019 Elsevier/Gold Standard (2016-12-15 09:39:04)  Metoclopramide tablets What is this medicine? METOCLOPRAMIDE (met oh kloe PRA mide) is used to treat the symptoms of gastroesophageal reflux disease (GERD) like heartburn. It is also used to treat people with slow emptying of the stomach and intestinal tract. This medicine may be used for other purposes; ask your health care provider or pharmacist if you have questions. COMMON BRAND NAME(S): Reglan What  should I tell my health care provider before I take this medicine? They need to know if you have any of these conditions: -breast cancer -depression -diabetes -heart failure -high blood pressure -kidney disease -liver disease -Parkinson's disease or a movement  disorder -pheochromocytoma -seizures -stomach obstruction, bleeding, or perforation -an unusual or allergic reaction to metoclopramide, procainamide, sulfites, other medicines, foods, dyes, or preservatives -pregnant or trying to get pregnant -breast-feeding How should I use this medicine? Take this medicine by mouth with a glass of water. Follow the directions on the prescription label. Take this medicine on an empty stomach, about 30 minutes before eating. Take your doses at regular intervals. Do not take your medicine more often than directed. Do not stop taking except on the advice of your doctor or health care professional. A special MedGuide will be given to you by the pharmacist with each prescription and refill. Be sure to read this information carefully each time. Talk to your pediatrician regarding the use of this medicine in children. Special care may be needed. Overdosage: If you think you have taken too much of this medicine contact a poison control center or emergency room at once. NOTE: This medicine is only for you. Do not share this medicine with others. What if I miss a dose? If you miss a dose, take it as soon as you can. If it is almost time for your next dose, take only that dose. Do not take double or extra doses. What may interact with this medicine? -acetaminophen -cyclosporine -digoxin -medicines for blood pressure -medicines for diabetes, including insulin -medicines for hay fever and other allergies -medicines for depression, especially a Monoamine Oxidase Inhibitor (MAOI) -medicines for Parkinson's disease, like levodopa -medicines for sleep or for pain -quinidine -tetracycline This list may not describe all possible interactions. Give your health care provider a list of all the medicines, herbs, non-prescription drugs, or dietary supplements you use. Also tell them if you smoke, drink alcohol, or use illegal drugs. Some items may interact with your  medicine. What should I watch for while using this medicine? It may take a few weeks for your stomach condition to start to get better. However, do not take this medicine for longer than 12 weeks. The longer you take this medicine, and the more you take it, the greater your chances are of developing serious side effects. If you are an elderly patient, a female patient, or you have diabetes, you may be at an increased risk for side effects from this medicine. Contact your doctor immediately if you start having movements you cannot control such as lip smacking, rapid movements of the tongue, involuntary or uncontrollable movements of the eyes, head, arms and legs, or muscle twitches and spasms. Patients and their families should watch out for worsening depression or thoughts of suicide. Also watch out for any sudden or severe changes in feelings such as feeling anxious, agitated, panicky, irritable, hostile, aggressive, impulsive, severely restless, overly excited and hyperactive, or not being able to sleep. If this happens, especially at the beginning of treatment or after a change in dose, call your doctor. Do not treat yourself for high fever. Ask your doctor or health care professional for advice. You may get drowsy or dizzy. Do not drive, use machinery, or do anything that needs mental alertness until you know how this drug affects you. Do not stand or sit up quickly, especially if you are an older patient. This reduces the risk of dizzy or fainting spells. Alcohol can  make you more drowsy and dizzy. Avoid alcoholic drinks. What side effects may I notice from receiving this medicine? Side effects that you should report to your doctor or health care professional as soon as possible: -allergic reactions like skin rash, itching or hives, swelling of the face, lips, or tongue -abnormal production of milk in females -breast enlargement in both males and females -change in the way you walk -difficulty  moving, speaking or swallowing -drooling, lip smacking, or rapid movements of the tongue -excessive sweating -fever -involuntary or uncontrollable movements of the eyes, head, arms and legs -irregular heartbeat or palpitations -muscle twitches and spasms -unusually weak or tired Side effects that usually do not require medical attention (report to your doctor or health care professional if they continue or are bothersome): -change in sex drive or performance -depressed mood -diarrhea -difficulty sleeping -headache -menstrual changes -restless or nervous This list may not describe all possible side effects. Call your doctor for medical advice about side effects. You may report side effects to FDA at 1-800-FDA-1088. Where should I keep my medicine? Keep out of the reach of children. Store at room temperature between 20 and 25 degrees C (68 and 77 degrees F). Protect from light. Keep container tightly closed. Throw away any unused medicine after the expiration date. NOTE: This sheet is a summary. It may not cover all possible information. If you have questions about this medicine, talk to your doctor, pharmacist, or health care provider.  2019 Elsevier/Gold Standard (2016-04-30 15:13:45)

## 2018-09-29 ENCOUNTER — Other Ambulatory Visit: Payer: Self-pay | Admitting: Family

## 2018-10-02 ENCOUNTER — Other Ambulatory Visit: Payer: Self-pay | Admitting: Family

## 2019-01-31 ENCOUNTER — Encounter: Payer: Self-pay | Admitting: Hematology & Oncology

## 2019-01-31 ENCOUNTER — Other Ambulatory Visit: Payer: Self-pay

## 2019-01-31 ENCOUNTER — Inpatient Hospital Stay: Payer: BC Managed Care – PPO | Attending: Hematology & Oncology | Admitting: Hematology & Oncology

## 2019-01-31 ENCOUNTER — Inpatient Hospital Stay: Payer: BC Managed Care – PPO

## 2019-01-31 VITALS — BP 117/79 | HR 75 | Temp 98.4°F | Resp 16 | Wt 210.0 lb

## 2019-01-31 DIAGNOSIS — D0512 Intraductal carcinoma in situ of left breast: Secondary | ICD-10-CM | POA: Diagnosis not present

## 2019-01-31 DIAGNOSIS — Z9013 Acquired absence of bilateral breasts and nipples: Secondary | ICD-10-CM | POA: Insufficient documentation

## 2019-01-31 DIAGNOSIS — D0502 Lobular carcinoma in situ of left breast: Secondary | ICD-10-CM

## 2019-01-31 LAB — CBC WITH DIFFERENTIAL (CANCER CENTER ONLY)
Abs Immature Granulocytes: 0.01 10*3/uL (ref 0.00–0.07)
Basophils Absolute: 0.1 10*3/uL (ref 0.0–0.1)
Basophils Relative: 1 %
Eosinophils Absolute: 0.1 10*3/uL (ref 0.0–0.5)
Eosinophils Relative: 2 %
HCT: 43.2 % (ref 36.0–46.0)
Hemoglobin: 14.5 g/dL (ref 12.0–15.0)
Immature Granulocytes: 0 %
Lymphocytes Relative: 43 %
Lymphs Abs: 2 10*3/uL (ref 0.7–4.0)
MCH: 29.4 pg (ref 26.0–34.0)
MCHC: 33.6 g/dL (ref 30.0–36.0)
MCV: 87.6 fL (ref 80.0–100.0)
Monocytes Absolute: 0.5 10*3/uL (ref 0.1–1.0)
Monocytes Relative: 10 %
Neutro Abs: 2.1 10*3/uL (ref 1.7–7.7)
Neutrophils Relative %: 44 %
Platelet Count: 297 10*3/uL (ref 150–400)
RBC: 4.93 MIL/uL (ref 3.87–5.11)
RDW: 12.6 % (ref 11.5–15.5)
WBC Count: 4.8 10*3/uL (ref 4.0–10.5)
nRBC: 0 % (ref 0.0–0.2)

## 2019-01-31 LAB — CMP (CANCER CENTER ONLY)
ALT: 21 U/L (ref 0–44)
AST: 20 U/L (ref 15–41)
Albumin: 4.6 g/dL (ref 3.5–5.0)
Alkaline Phosphatase: 80 U/L (ref 38–126)
Anion gap: 11 (ref 5–15)
BUN: 13 mg/dL (ref 6–20)
CO2: 24 mmol/L (ref 22–32)
Calcium: 9 mg/dL (ref 8.9–10.3)
Chloride: 103 mmol/L (ref 98–111)
Creatinine: 0.62 mg/dL (ref 0.44–1.00)
GFR, Est AFR Am: >60 mL/min (ref >60)
GFR, Estimated: >60 mL/min (ref >60)
Glucose, Bld: 95 mg/dL (ref 70–99)
Potassium: 4 mmol/L (ref 3.5–5.1)
Sodium: 138 mmol/L (ref 135–145)
Total Bilirubin: 0.7 mg/dL (ref 0.3–1.2)
Total Protein: 7.2 g/dL (ref 6.5–8.1)

## 2019-01-31 NOTE — Progress Notes (Signed)
Hematology and Oncology Follow Up Visit  CHRYSTINA NAFF [id number] [birth year] 53 y.o. 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 Gilbertson is back for follow-up.  She is doing okay.  Apparently, her mother is brother passed away over the weekend.  He had stomach cancer.  He lives in [place], [place].  She will be taking her mother out for the funeral service.  She apparently had a detached retina on her right eye.  This is back in January.  There was no trauma.  She had surgery for this.  She now has a cataract with the right eye.  She needs to have this fixed.  Otherwise she seems to be okay.  She is staying safe from the coronavirus.  She is doing a little bit of swimming.  She is walking a little bit.  She has had no problems with bowels or bladder.  She has had no cough.  There is been no rashes.  She has had no leg swelling.    Overall, her performance status is ECOG 1.    Medications:  Current Outpatient Medications:  .  Erenumab-aooe (AIMOVIG) 140 MG/ML SOAJ, Inject 140 mg into the skin every 30 (thirty) days., Disp: 1 pen, Rfl: 11 .  levothyroxine (SYNTHROID, LEVOTHROID) 125 MCG tablet, Take 125 mcg by mouth daily before breakfast., Disp: , Rfl:  .  magnesium oxide (MAG-OX) 400 MG tablet, Take 400 mg by mouth daily., Disp: , Rfl:  .  metoCLOPramide (REGLAN) 10 MG tablet, Take 1 tablet (10 mg total) by mouth every 8 (eight) hours as needed for nausea. Or migraine, Disp: 30 tablet, Rfl: 11 .  Multiple Vitamin (MULTIVITAMIN) tablet, Take 1 tablet by mouth daily., Disp: , Rfl:  .  pantoprazole (PROTONIX) 40 MG tablet, TAKE 1 TABLET(40 MG) BY MOUTH TWICE DAILY, Disp: 60 tablet, Rfl: 5 .  Riboflavin 400 MG TABS, Take by mouth., Disp: , Rfl:  .  rizatriptan (MAXALT-MLT) 10 MG disintegrating tablet, Take 1 tablet (10 mg total) by mouth as needed for migraine. May  repeat in 2 hours if needed, Disp: 15 tablet, Rfl: 11 .  SUMAtriptan 6 MG/0.5ML SOAJ, Inject 6 mg into the skin as needed., Disp: 12 Cartridge, Rfl: 11 .  tiZANidine (ZANAFLEX) 4 MG tablet, Take 1 tablet (4 mg total) by mouth every 6 (six) hours as needed for muscle spasms., Disp: 30 tablet, Rfl: 6  Allergies:  Allergies  Allergen Reactions  . Penicillins Rash    Past Medical History, Surgical history, Social history, and Family History were reviewed and updated.  Review of Systems: Review of Systems  All other systems reviewed and are negative.    Physical Exam:  weight is 210 lb (95.3 kg). Her oral temperature is 98.4 F (36.9 C). Her blood pressure is 117/79 and her pulse is 75. Her respiration is 16 and oxygen saturation is 99%.   Wt Readings from Last 3 Encounters:  01/31/19 210 lb (95.3 kg)  09/23/18 223 lb (101.2 kg)  08/09/18 224 lb (101.6 kg)     Physical Exam Vitals signs reviewed.  Constitutional:      Comments: Breast exam shows bilateral mastectomies.  She has implants.  Everything looks well-healed.  She has no adenopathy in the axilla bilaterally.  HENT:     Head: Normocephalic and atraumatic.  Eyes:     Pupils: Pupils are  equal, round, and reactive to light.  Neck:     Musculoskeletal: Normal range of motion.  Cardiovascular:     Rate and Rhythm: Normal rate and regular rhythm.     Heart sounds: Normal heart sounds.  Pulmonary:     Effort: Pulmonary effort is normal.     Breath sounds: Normal breath sounds.  Abdominal:     General: Bowel sounds are normal.     Palpations: Abdomen is soft.  Musculoskeletal: Normal range of motion.        General: No tenderness or deformity.  Lymphadenopathy:     Cervical: No cervical adenopathy.  Skin:    General: Skin is warm and dry.     Findings: No erythema or rash.  Neurological:     Mental Status: She is alert and oriented to person, place, and time.  Psychiatric:        Behavior: Behavior normal.         Thought Content: Thought content normal.        Judgment: Judgment normal.     Lab Results  Component Value Date   WBC 5.0 08/02/2018   HGB 14.1 08/02/2018   HCT 44.2 08/02/2018   MCV 89.5 08/02/2018   PLT 307 08/02/2018     Chemistry      Component Value Date/Time   NA 137 08/02/2018 0805   NA 139 01/29/2017 0747   NA 138 05/30/2016 0746   K 3.8 08/02/2018 0805   K 3.8 01/29/2017 0747   K 3.8 05/30/2016 0746   CL 100 08/02/2018 0805   CL 105 01/29/2017 0747   CO2 28 08/02/2018 0805   CO2 30 01/29/2017 0747   CO2 25 05/30/2016 0746   BUN 11 08/02/2018 0805   BUN 7 01/29/2017 0747   BUN 10.0 05/30/2016 0746   CREATININE 0.80 08/02/2018 0805   CREATININE 0.8 01/29/2017 0747   CREATININE 0.7 05/30/2016 0746      Component Value Date/Time   CALCIUM 9.5 08/02/2018 0805   CALCIUM 9.3 01/29/2017 0747   CALCIUM 9.2 05/30/2016 0746   ALKPHOS 85 08/02/2018 0805   ALKPHOS 70 01/29/2017 0747   ALKPHOS 71 05/30/2016 0746   AST 13 (L) 08/02/2018 0805   AST 16 05/30/2016 0746   ALT 14 08/02/2018 0805   ALT 18 01/29/2017 0747   ALT 11 05/30/2016 0746   BILITOT 0.5 08/02/2018 0805   BILITOT 0.83 05/30/2016 0746         Impression and Plan: Ms. Lowe is a 53 year old white female. She had ductal carcinoma in situ of the left breast. This was multifocal. She underwent bilateral mastectomies.  I do feel bad about her right eye.  Hopefully, with the cataract surgery, she will have good vision.  We will plan to get her back in another 6 months.    [name], MD 7/6/20208:36 AM

## 2019-02-11 ENCOUNTER — Encounter: Payer: BC Managed Care – PPO | Admitting: Family

## 2019-03-22 HISTORY — PX: CATARACT EXTRACTION: SUR2

## 2019-03-23 NOTE — Progress Notes (Signed)
[facility]    Provider:  Dr Jaynee Eagles Referring Provider: Dr. Krista Blue Primary Care Provider:  Debbrah Alar, NP  CC:  Migraines  Interval history 5-6 headache days a month. She had cataract surgery, she sometimes sees spirals in both eyes sometimes associated within 30 minutes of a headache other times just the spirals. She smells cigarette smoke, can happen for days, it comes and goes for several years. She had side effects to sumatriptan injections makes her feel weired. The maxalt still works but she as to take 2 and sthe migraine rebounds that day.  HPI:  [name] is a 52 y.o. female here as requested by provider Debbrah Alar, NP for migraines. She has tried nortriptyline, maxalt, zofran, magnesium and riboflavin, celexa, tizanidine. At least 18 headache days a month, all are migrainous and moderately-severe to severe. Ongoing for over a year at this severity and frequency.Migraines can last 24-72 hours. +nausea, +vomiting, severe and pounding, +light and sound sensitivity, starts on the left, movement makes it worse. Started in 1994. Started worsening in 41s. Currently no medication overuse.    Review of Systems: Patient complains of symptoms per HPI as well as the following symptoms: headache, abnormal smells, hx of breast cancer. Pertinent negatives and positives per HPI. All others negative.   Social History   Socioeconomic History  . Marital status: Married    Spouse name: [name]  . Number of children: 0  . Years of education: Masters  . Highest education level: Not on file  Occupational History  . Occupation: Medical Records  Social Needs  . Financial resource strain: Not on file  . Food insecurity    Worry: Not on file    Inability: Not on file  . Transportation needs    Medical: Not on file    Non-medical: Not on file  Tobacco Use  . Smoking status: Never Smoker  . Smokeless tobacco: Never Used  Substance and Sexual Activity  . Alcohol  use: No    Alcohol/week: 0.0 standard drinks  . Drug use: No  . Sexual activity: Yes  Lifestyle  . Physical activity    Days per week: Not on file    Minutes per session: Not on file  . Stress: Not on file  Relationships  . Social Herbalist on phone: Not on file    Gets together: Not on file    Attends religious service: Not on file    Active member of club or organization: Not on file    Attends meetings of clubs or organizations: Not on file    Relationship status: Not on file  . Intimate partner violence    Fear of current or ex partner: Not on file    Emotionally abused: Not on file    Physically abused: Not on file    Forced sexual activity: Not on file  Other Topics Concern  . Not on file  Social History Narrative   Lives at home with husband.   Right-handed.   1 cups caffeine daily.   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 Breast cancer Mother 20  . Heart disease Mother        Atrial fibrillation  . Melanoma Father 47  . COPD Father   . Arthritis Father   . Dementia Father   .  Pneumonia Father   . Breast cancer Sister 63       ER+/her2-; onco score = 8  . Cancer Maternal Aunt        oral cancer; smoker  . Head & neck cancer Paternal Uncle        smoker  . Stomach cancer Maternal Uncle   . Breast cancer Cousin        dx in her late 30s-60s    Past Medical History:  Diagnosis Date  . Abrasion of right arm 12/25/2016  . Complication of anesthesia    panic attack while waking up  . Detached retina, right 08/25/2018  . History of breast cancer 2014  . Hypothyroidism   . Migraines   . PONV (postoperative nausea and vomiting)     Patient Active Problem List   Diagnosis Date Noted  . Chronic migraine without aura, with intractable migraine, so stated, with status migrainosus 09/23/2018  . S/P mastectomy,  bilateral 07/06/2018  . S/P breast reconstruction, bilateral 07/06/2018  . Gastroesophageal reflux disease 12/31/2017  . Hypothyroidism 12/31/2017  . History of breast cancer 12/31/2017  . Hot flashes 12/31/2017  . Depression 12/31/2017  . Other osteoporosis without current pathological fracture 01/29/2017  . Acquired absence of bilateral breasts and nipples 09/23/2016  . Breast cancer ([place]) 09/08/2016  . 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 S/P laparoscopic assisted vaginal hysterectomy (LAVH) 06/17/2016  . Chronic migraine 12/10/2015  . Chest pain 02/10/2014  . Myalgia 02/10/2014  . Hot flashes due to tamoxifen 02/10/2014  . Neoplasm of left breast, primary tumor staging category Tis 05/19/2013    Past Surgical History:  Procedure Laterality Date  . BREAST LUMPECTOMY WITH NEEDLE LOCALIZATION Left 06/20/2013   Procedure: BREAST LUMPECTOMY WITH NEEDLE LOCALIZATION;  Surgeon: Merrie Roof, MD;  Location: [facility];  Service: General;  Laterality: Left;  . BREAST RECONSTRUCTION Left 04/09/2017   Procedure: REVISION BREAST RECONSTRUCTION;  Surgeon: Wallace Going, DO;  Location: [facility];  Service: Plastics;  Laterality: Left;  . CATARACT EXTRACTION Right 03/22/2019  . LAPAROSCOPIC VAGINAL HYSTERECTOMY WITH SALPINGO OOPHORECTOMY Bilateral 06/17/2016   Procedure: LAPAROSCOPIC ASSISTED VAGINAL HYSTERECTOMY WITH SALPINGO OOPHORECTOMY;  Surgeon: Arvella Nigh, MD;  Location: [facility];  Service: Gynecology;  Laterality: Bilateral;  need bed  . LIPOSUCTION WITH LIPOFILLING Left 04/09/2017   Procedure: LIPOFILLING TO THE LEFT UPPER BREAST TO IMPROVE SYMMETRY.;  Surgeon: Wallace Going, DO;  Location: [facility];  Service: Plastics;  Laterality: Left;  Marland Kitchen MASTECTOMY W/ SENTINEL NODE BIOPSY Bilateral 09/08/2016   Procedure: LEFT MASTECTOMY WITH LEFT SENTINEL LYMPH NODE BIOPSY, RIGHT PROPHYLACTIC  MASTECTOMY;  Surgeon: Autumn Messing III, MD;  Location: [facility];  Service: General;  Laterality: Bilateral;  . PALATE TO GINGIVA GRAFT    . REMOVAL OF BILATERAL TISSUE EXPANDERS WITH PLACEMENT OF BILATERAL BREAST IMPLANTS Bilateral 01/01/2017   Procedure: REMOVAL OF BILATERAL TISSUE EXPANDERS WITH PLACEMENT OF BILATERAL SILICONE BREAST IMPLANTS WITH LIPOSUCTION;  Surgeon: Wallace Going, DO;  Location: [facility];  Service: Plastics;  Laterality: Bilateral;  . RETINAL DETACHMENT SURGERY Right 08/25/2018  . TISSUE EXPANDER PLACEMENT Bilateral 09/08/2016   Procedure: IMMEDIATE PLACEMENT OF BILATERAL TISSUE EXPANDERS AFTER MASTECTOMIES;  Surgeon: Loel Lofty Dillingham, DO;  Location: [facility];  Service: Plastics;  Laterality: Bilateral;    Current Outpatient Medications  Medication Sig Dispense Refill  . Erenumab-aooe (AIMOVIG) 140 MG/ML SOAJ Inject 140 mg into the  skin every 30 (thirty) days. 1 pen 11  . levothyroxine (SYNTHROID, LEVOTHROID) 125 MCG tablet Take 125 mcg by mouth daily before breakfast.    . magnesium oxide (MAG-OX) 400 MG tablet Take 400 mg by mouth daily.    . Multiple Vitamin (MULTIVITAMIN) tablet Take 1 tablet by mouth daily.    . rizatriptan (MAXALT-MLT) 10 MG disintegrating tablet Take 1 tablet (10 mg total) by mouth as needed for migraine. May repeat in 2 hours if needed 15 tablet 11  . tiZANidine (ZANAFLEX) 4 MG tablet Take 1 tablet (4 mg total) by mouth every 6 (six) hours as needed for muscle spasms. 30 tablet 6  . Rimegepant Sulfate (NURTEC) 75 MG TBDP Take 75 mg by mouth daily as needed. For migraines. Take as close to onset of migraine as possible. One daily maximum. 4 tablet 0   No current facility-administered medications for this visit.     Allergies as of 03/24/2019 - Review Complete 03/24/2019  Allergen Reaction Noted  . Penicillins Rash 05/19/2013    Vitals: BP 112/80 (BP Location: Right Arm, Patient Position: Sitting)   Pulse 72   Temp (!) 97.1 F  (36.2 C) Comment: taken by check-in staff  Ht 5' 7.5" (1.715 m)   Wt 207 lb (93.9 kg)   LMP 02/01/2014   BMI 31.94 kg/m  Last Weight:  Wt Readings from Last 1 Encounters:  03/24/19 207 lb (93.9 kg)   Last Height:   Ht Readings from Last 1 Encounters:  03/24/19 5' 7.5" (1.715 m)     Physical exam: Exam: Gen: NAD, conversant, well nourised, well groomed                     CV: RRR, no MRG. No Carotid Bruits. No peripheral edema, warm, nontender Eyes: Conjunctivae clear without exudates or hemorrhage  Neuro: Detailed Neurologic Exam  Speech:    Speech is normal; fluent and spontaneous with normal comprehension.  Cognition:    The patient is oriented to person, place, and time;     recent and remote memory intact;     language fluent;     normal attention, concentration,     fund of knowledge Cranial Nerves:    The pupils are equal, round, and reactive to light. The fundi are normal and spontaneous venous pulsations are present. Visual fields are full to finger confrontation. Extraocular movements are intact. Trigeminal sensation is intact and the muscles of mastication are normal. The face is symmetric. The palate elevates in the midline. Hearing intact. Voice is normal. Shoulder shrug is normal. The tongue has normal motion without fasciculations.   Coordination:    Normal finger to nose  Gait:    Heel-toe and tandem gait are normal.   Motor Observation:    No asymmetry, no atrophy, and no involuntary movements noted. Tone:    Normal muscle tone.    Posture:    Posture is normal. normal erect    Strength:    Strength is V/V in the upper and lower limbs.      Sensation: intact to LT     Reflex Exam:  DTR's:    Deep tendon reflexes in the upper and lower extremities are normal bilaterally.   Toes:    The toes are downgoing bilaterally.   Clonus:    Clonus is absent.    Assessment/Plan:  Patient with chronic migraines failed multiple medications. Disussed  options for patient. Doing exceptionally well on CGRp but she has concerning  symptoms and needs further evaluation.  MRI brain w/wo contrast - olfactory hallucinations(ongoing but newly reported to me), hx of breast cancer, eval for mets or seizure focus or other temporal lobe abnormality  Discussed amerge, more long acting can try that but will give nurtec and can use it with the maxxalt or alone Continue Erenumab - copay card At onset of migraine: Maxalt and Reglan (may need to tweak after about a month on Aimovig, will see how she responds. May need a longer acting triptan due to rebound however try Reglan with the mexalt and taking aimovig will make migraines easier to treat acutely as well. If doing well can slowly titrate off nortriptyline but email me first for directions  Meds ordered this encounter  Medications  . Rimegepant Sulfate (NURTEC) 75 MG TBDP    Sig: Take 75 mg by mouth daily as needed. For migraines. Take as close to onset of migraine as possible. One daily maximum.    Dispense:  4 tablet    Refill:  0   Orders Placed This Encounter  Procedures  . MR BRAIN W WO CONTRAST     Discussed: To prevent or relieve headaches, try the following: Cool Compress. Lie down and place a cool compress on your head.  Avoid headache triggers. If certain foods or odors seem to have triggered your migraines in the past, avoid them. A headache diary might help you identify triggers.  Include physical activity in your daily routine. Try a daily walk or other moderate aerobic exercise.  Manage stress. Find healthy ways to cope with the stressors, such as delegating tasks on your to-do list.  Practice relaxation techniques. Try deep breathing, yoga, massage and visualization.  Eat regularly. Eating regularly scheduled meals and maintaining a healthy diet might help prevent headaches. Also, drink plenty of fluids.  Follow a regular sleep schedule. Sleep deprivation might contribute to  headaches Consider biofeedback. With this mind-body technique, you learn to control certain bodily functions - such as muscle tension, heart rate and blood pressure - to prevent headaches or reduce headache pain.    Proceed to emergency room if you experience new or worsening symptoms or symptoms do not resolve, if you have new neurologic symptoms or if headache is severe, or for any concerning symptom.   Provided education and documentation from American headache Society toolbox including articles on: chronic migraine medication overuse headache, chronic migraines, prevention of migraines, behavioral and other nonpharmacologic treatments for headache.  Cc: Debbrah Alar, NP,    A total of 25 minutes was spent face-to-face with this patient. Over half this time was spent on counseling patient on the  1. Olfactory hallucination   2. Hx of breast cancer   3. Chronic migraine without aura, with intractable migraine, so stated, with status migrainosus    diagnosis and different diagnostic and therapeutic options, counseling and coordination of care, risks ans benefits of management, compliance, or risk factor reduction and education.     Sarina Ill, MD  [facility] [address] [place], [place] [address]  Phone [phone] Fax [phone]

## 2019-03-24 ENCOUNTER — Encounter: Payer: Self-pay | Admitting: Neurology

## 2019-03-24 ENCOUNTER — Other Ambulatory Visit: Payer: Self-pay

## 2019-03-24 ENCOUNTER — Ambulatory Visit (INDEPENDENT_AMBULATORY_CARE_PROVIDER_SITE_OTHER): Payer: BC Managed Care – PPO | Admitting: Neurology

## 2019-03-24 VITALS — BP 112/80 | HR 72 | Temp 97.1°F | Ht 67.5 in | Wt 207.0 lb

## 2019-03-24 DIAGNOSIS — Z853 Personal history of malignant neoplasm of breast: Secondary | ICD-10-CM | POA: Diagnosis not present

## 2019-03-24 DIAGNOSIS — R442 Other hallucinations: Secondary | ICD-10-CM

## 2019-03-24 DIAGNOSIS — G43711 Chronic migraine without aura, intractable, with status migrainosus: Secondary | ICD-10-CM

## 2019-03-24 MED ORDER — NURTEC 75 MG PO TBDP: 75.0000 mg | ORAL_TABLET | Freq: Every day | ORAL | 0 refills | Status: DC | PRN

## 2019-03-24 NOTE — Patient Instructions (Addendum)
MRI brain w/wo contrast  There is increased risk for stroke in women with migraine with aura and a contraindication for the combined contraceptive pill for use by women who have migraine with aura. The risk for women with migraine without aura is lower. However other risk factors like smoking are far more likely to increase stroke risk than migraine. There is a recommendation for no smoking and for the use of OCPs without estrogen such as progestogen only pills particularly for women with migraine with aura.Marland Kitchen People who have migraine headaches with auras may be 3 times more likely to have a stroke caused by a blood clot, compared to migraine patients who don't see auras. Women who take hormone-replacement therapy may be 30 percent more likely to suffer a clot-based stroke than women not taking medication containing estrogen. Other risk factors like smoking and high blood pressure may be  much more important  Rimegepant: Patient drug information Access [employer] Online here. Copyright [phone] [employer] All rights reserved. (For additional information see "Rimegepant: Drug information") Brand Names: [place]  Nurtec  What is this drug used for?   It is used to treat migraine headaches.  What do I need to tell my doctor BEFORE I take this drug?   If you are allergic to this drug; any part of this drug; or any other drugs, foods, or substances. Tell your doctor about the allergy and what signs you had.   If you have any of these health problems: Kidney disease or liver disease.   If you take any drugs (prescription or OTC, natural products, vitamins) that must not be taken with this drug, like certain drugs that are used for HIV, infections, or seizures. There are many drugs that must not be taken with this drug.   This is not a list of all drugs or health problems that interact with this drug.   Tell your doctor and pharmacist about all of your drugs (prescription or OTC, natural products, vitamins)  and health problems. You must check to make sure that it is safe for you to take this drug with all of your drugs and health problems. Do not start, stop, or change the dose of any drug without checking with your doctor.  What are some things I need to know or do while I take this drug?   Tell all of your health care providers that you take this drug. This includes your doctors, nurses, pharmacists, and dentists.   This drug is not meant to prevent or lower the number of migraine headaches you get.   Tell your doctor if you are pregnant, plan on getting pregnant, or are breast-feeding. You will need to talk about the benefits and risks to you and the baby.  What are some side effects that I need to call my doctor about right away?   WARNING/CAUTION: Even though it may be rare, some people may have very bad and sometimes deadly side effects when taking a drug. Tell your doctor or get medical help right away if you have any of the following signs or symptoms that may be related to a very bad side effect:   Signs of an allergic reaction, like rash; hives; itching; red, swollen, blistered, or peeling skin with or without fever; wheezing; tightness in the chest or throat; trouble breathing, swallowing, or talking; unusual hoarseness; or swelling of the mouth, face, lips, tongue, or throat.  What are some other side effects of this drug?   All drugs may  cause side effects. However, many people have no side effects or only have minor side effects. Call your doctor or get medical help if any of these side effects or any other side effects bother you or do not go away:   Upset stomach.   These are not all of the side effects that may occur. If you have questions about side effects, call your doctor. Call your doctor for medical advice about side effects.   You may report side effects to your national health agency.  How is this drug best taken?   Use this drug as ordered by your doctor. Read all information  given to you. Follow all instructions closely.   Do not push the tablet out of the foil when opening. Use dry hands to take it from the foil. Place on your tongue and let it dissolve. Water is not needed. Do not swallow it whole. Do not chew, break, or crush it.   If needed, you may place the tablet under the tongue.   Use right after opening.  What do I do if I miss a dose?   This drug is taken on an as needed basis. Do not take more often than told by the doctor.  How do I store and/or throw out this drug?   Store at room temperature in a dry place. Do not store in a bathroom.   Store in foil pouch until ready for use.   Keep all drugs in a safe place. Keep all drugs out of the reach of children and pets.   Throw away unused or expired drugs. Do not flush down a toilet or pour down a drain unless you are told to do so. Check with your pharmacist if you have questions about the best way to throw out drugs. There may be drug take-back programs in your area.  General drug facts   If your symptoms or health problems do not get better or if they become worse, call your doctor.   Do not share your drugs with others and do not take anyone else's drugs.   Some drugs may have another patient information leaflet. If you have any questions about this drug, please talk with your doctor, nurse, pharmacist, or other health care provider.   If you think there has been an overdose, call your poison control center or get medical care right away. Be ready to tell or show what was taken, how much, and when it happened.

## 2019-03-30 ENCOUNTER — Telehealth: Payer: Self-pay | Admitting: Neurology

## 2019-03-30 NOTE — Telephone Encounter (Signed)
Patients MRI was approved through her primary insurance [employer] state but her secondary did not approve it and they are requesting a p2p at [phone].

## 2019-03-31 NOTE — Telephone Encounter (Signed)
[name], let me know if I need to call anyone thanks

## 2019-03-31 NOTE — Telephone Encounter (Signed)
I spoke with the p2p agent who stated the representative started this case incorrectly. We withdrew the incorrect test. I call the plan for the federal ID and they stated it did not require auth. Ref#[id number]

## 2019-04-01 ENCOUNTER — Telehealth: Payer: Self-pay | Admitting: Neurology

## 2019-04-01 NOTE — Telephone Encounter (Signed)
Pt states the Rimegepant Sulfate (NURTEC) 75 MG TBDP has not helped at all(Pt aware the office will reopen on Tues )

## 2019-04-05 NOTE — Telephone Encounter (Signed)
Scheduled at GI. DW

## 2019-04-28 ENCOUNTER — Ambulatory Visit
Admission: RE | Admit: 2019-04-28 | Discharge: 2019-04-28 | Disposition: A | Discharge status: Home / Self Care | Payer: BC Managed Care – PPO | Source: Ambulatory Visit | Attending: Neurology | Admitting: Neurology

## 2019-04-28 ENCOUNTER — Other Ambulatory Visit: Payer: Self-pay

## 2019-04-28 DIAGNOSIS — Z853 Personal history of malignant neoplasm of breast: Secondary | ICD-10-CM | POA: Diagnosis not present

## 2019-04-28 DIAGNOSIS — R442 Other hallucinations: Secondary | ICD-10-CM | POA: Diagnosis not present

## 2019-04-28 MED ORDER — GADOBENATE DIMEGLUMINE 529 MG/ML IV SOLN
19.0000 mL | Freq: Once | INTRAVENOUS | Status: AC | PRN
  Administered 2019-04-28: 10:00:00 19 mL via INTRAVENOUS

## 2019-06-22 ENCOUNTER — Other Ambulatory Visit: Payer: Self-pay | Admitting: *Deleted

## 2019-06-22 MED ORDER — RIZATRIPTAN BENZOATE 10 MG PO TBDP: ORAL_TABLET | ORAL | 5 refills | Status: DC

## 2019-07-13 ENCOUNTER — Telehealth: Payer: Self-pay

## 2019-07-13 ENCOUNTER — Other Ambulatory Visit: Payer: Self-pay | Admitting: Neurology

## 2019-07-13 MED ORDER — ONDANSETRON 4 MG PO TBDP: 4.0000 mg | ORAL_TABLET | Freq: Three times a day (TID) | ORAL | 3 refills | Status: DC | PRN

## 2019-07-13 NOTE — Telephone Encounter (Signed)
We receive zofran prescription that pt wants refill on medication. I called the pharmacy and they stated Dr. Krista Blue last prescribed it 2019. Pt last refill the medication 02/07/2019. I stated pt sees Dr.Ahern for migraine management. Message will be sent to Dr. Jaynee Eagles if she wants to refill it.

## 2019-07-13 NOTE — Telephone Encounter (Signed)
I ordered it thanks

## 2019-08-03 ENCOUNTER — Encounter: Payer: Self-pay | Admitting: Hematology & Oncology

## 2019-08-03 ENCOUNTER — Inpatient Hospital Stay: Payer: BC Managed Care – PPO

## 2019-08-03 ENCOUNTER — Inpatient Hospital Stay: Payer: BC Managed Care – PPO | Attending: Hematology & Oncology | Admitting: Hematology & Oncology

## 2019-08-03 ENCOUNTER — Other Ambulatory Visit: Payer: Self-pay

## 2019-08-03 VITALS — BP 112/69 | HR 69 | Temp 97.5°F | Resp 18 | Wt 207.0 lb

## 2019-08-03 DIAGNOSIS — D0502 Lobular carcinoma in situ of left breast: Secondary | ICD-10-CM

## 2019-08-03 DIAGNOSIS — Z9013 Acquired absence of bilateral breasts and nipples: Secondary | ICD-10-CM | POA: Insufficient documentation

## 2019-08-03 DIAGNOSIS — D0512 Intraductal carcinoma in situ of left breast: Secondary | ICD-10-CM | POA: Diagnosis present

## 2019-08-03 LAB — CMP (CANCER CENTER ONLY)
ALT: 22 U/L (ref 0–44)
AST: 17 U/L (ref 15–41)
Albumin: 4.3 g/dL (ref 3.5–5.0)
Alkaline Phosphatase: 71 U/L (ref 38–126)
Anion gap: 8 (ref 5–15)
BUN: 15 mg/dL (ref 6–20)
CO2: 29 mmol/L (ref 22–32)
Calcium: 9.8 mg/dL (ref 8.9–10.3)
Chloride: 104 mmol/L (ref 98–111)
Creatinine: 0.72 mg/dL (ref 0.44–1.00)
GFR, Est AFR Am: >60 mL/min (ref >60)
GFR, Estimated: >60 mL/min (ref >60)
Glucose, Bld: 105 mg/dL — ABNORMAL HIGH (ref 70–99)
Potassium: 4.1 mmol/L (ref 3.5–5.1)
Sodium: 141 mmol/L (ref 135–145)
Total Bilirubin: 0.5 mg/dL (ref 0.3–1.2)
Total Protein: 6.9 g/dL (ref 6.5–8.1)

## 2019-08-03 LAB — CBC WITH DIFFERENTIAL (CANCER CENTER ONLY)
Abs Immature Granulocytes: 0.01 10*3/uL (ref 0.00–0.07)
Basophils Absolute: 0 10*3/uL (ref 0.0–0.1)
Basophils Relative: 1 %
Eosinophils Absolute: 0.1 10*3/uL (ref 0.0–0.5)
Eosinophils Relative: 2 %
HCT: 40.9 % (ref 36.0–46.0)
Hemoglobin: 13.5 g/dL (ref 12.0–15.0)
Immature Granulocytes: 0 %
Lymphocytes Relative: 44 %
Lymphs Abs: 2 10*3/uL (ref 0.7–4.0)
MCH: 30 pg (ref 26.0–34.0)
MCHC: 33 g/dL (ref 30.0–36.0)
MCV: 90.9 fL (ref 80.0–100.0)
Monocytes Absolute: 0.5 10*3/uL (ref 0.1–1.0)
Monocytes Relative: 10 %
Neutro Abs: 1.9 10*3/uL (ref 1.7–7.7)
Neutrophils Relative %: 43 %
Platelet Count: 285 10*3/uL (ref 150–400)
RBC: 4.5 MIL/uL (ref 3.87–5.11)
RDW: 12.4 % (ref 11.5–15.5)
WBC Count: 4.5 10*3/uL (ref 4.0–10.5)
nRBC: 0 % (ref 0.0–0.2)

## 2019-08-03 NOTE — Progress Notes (Signed)
Hematology and Oncology Follow Up Visit  [name] [id number] 1965/09/18 54 y.o. 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  She is doing okay.  Her vision in the right eye is better.  She had the detached retina.  She also had a cataract.  These were all taken care of.  He was a quiet Christmas for her.  She has had little bit of tenderness just to the left of the sternum.  She thinks she may feel something.  She has had no swelling or erythema of the left breast.  There is been no cough.  She has had no nausea or vomiting.  There is been no change in bowel or bladder habits.  She has had no change in medications.  Overall, her performance status is ECOG 1.    Medications:  Current Outpatient Medications:  .  Erenumab-aooe (AIMOVIG) 140 MG/ML SOAJ, Inject 140 mg into the skin every 30 (thirty) days., Disp: 1 pen, Rfl: 11 .  levothyroxine (SYNTHROID, LEVOTHROID) 125 MCG tablet, Take 125 mcg by mouth daily before breakfast., Disp: , Rfl:  .  magnesium oxide (MAG-OX) 400 MG tablet, Take 400 mg by mouth daily., Disp: , Rfl:  .  Multiple Vitamin (MULTIVITAMIN) tablet, Take 1 tablet by mouth daily., Disp: , Rfl:  .  ondansetron (ZOFRAN-ODT) 4 MG disintegrating tablet, Take 1 tablet (4 mg total) by mouth every 8 (eight) hours as needed for nausea., Disp: 30 tablet, Rfl: 3 .  Rimegepant Sulfate (NURTEC) 75 MG TBDP, Take 75 mg by mouth daily as needed. For migraines. Take as close to onset of migraine as possible. One daily maximum., Disp: 4 tablet, Rfl: 0 .  rizatriptan (MAXALT-MLT) 10 MG disintegrating tablet, Take 1 tab at onset of migraine.  May repeat in 2 hrs, if needed.  Max dose: 2 tabs/day. This is a 30 day prescription., Disp: 15 tablet, Rfl: 5 .  tiZANidine (ZANAFLEX) 4 MG tablet, Take 1 tablet (4 mg total) by mouth every 6 (six)  hours as needed for muscle spasms., Disp: 30 tablet, Rfl: 6  Allergies:  Allergies  Allergen Reactions  . Penicillins Rash    Past Medical History, Surgical history, Social history, and Family History were reviewed and updated.  Review of Systems: Review of Systems  All other systems reviewed and are negative.    Physical Exam:  vitals were not taken for this visit.   Wt Readings from Last 3 Encounters:  03/24/19 207 lb (93.9 kg)  01/31/19 210 lb (95.3 kg)  09/23/18 223 lb (101.2 kg)     Physical Exam Vitals reviewed.  Constitutional:      Comments: Breast exam shows bilateral mastectomies.  She has implants.  Everything looks well-healed.  She has no adenopathy in the axilla bilaterally.  HENT:     Head: Normocephalic and atraumatic.  Eyes:     Pupils: Pupils are equal, round, and reactive to light.  Cardiovascular:     Rate and Rhythm: Normal rate and regular rhythm.     Heart sounds: Normal heart sounds.  Pulmonary:     Effort: Pulmonary effort is normal.     Breath sounds: Normal breath sounds.  Abdominal:     General: Bowel sounds are normal.     Palpations: Abdomen is soft.  Musculoskeletal:  General: No tenderness or deformity. Normal range of motion.     Cervical back: Normal range of motion.  Lymphadenopathy:     Cervical: No cervical adenopathy.  Skin:    General: Skin is warm and dry.     Findings: No erythema or rash.  Neurological:     Mental Status: She is alert and oriented to person, place, and time.  Psychiatric:        Behavior: Behavior normal.        Thought Content: Thought content normal.        Judgment: Judgment normal.     Lab Results  Component Value Date   WBC 4.5 08/03/2019   HGB 13.5 08/03/2019   HCT 40.9 08/03/2019   MCV 90.9 08/03/2019   PLT 285 08/03/2019     Chemistry      Component Value Date/Time   NA 138 01/31/2019 0839   NA 139 01/29/2017 0747   NA 138 05/30/2016 0746   K 4.0 01/31/2019 0839   K 3.8  01/29/2017 0747   K 3.8 05/30/2016 0746   CL 103 01/31/2019 0839   CL 105 01/29/2017 0747   CO2 24 01/31/2019 0839   CO2 30 01/29/2017 0747   CO2 25 05/30/2016 0746   BUN 13 01/31/2019 0839   BUN 7 01/29/2017 0747   BUN 10.0 05/30/2016 0746   CREATININE 0.62 01/31/2019 0839   CREATININE 0.8 01/29/2017 0747   CREATININE 0.7 05/30/2016 0746      Component Value Date/Time   CALCIUM 9.0 01/31/2019 0839   CALCIUM 9.3 01/29/2017 0747   CALCIUM 9.2 05/30/2016 0746   ALKPHOS 80 01/31/2019 0839   ALKPHOS 70 01/29/2017 0747   ALKPHOS 71 05/30/2016 0746   AST 20 01/31/2019 0839   AST 16 05/30/2016 0746   ALT 21 01/31/2019 0839   ALT 18 01/29/2017 0747   ALT 11 05/30/2016 0746   BILITOT 0.7 01/31/2019 0839   BILITOT 0.83 05/30/2016 0746         Impression and Plan: [name] is a 3 year old white female. She had ductal carcinoma in situ of the left breast. This was multifocal. She underwent bilateral mastectomies.  I we will set her up with an ultrasound of the left breast area.  She has had a mastectomy so there is no role for a mammogram.  I do not think an MRI would be indicated for right now.  Otherwise everything does look fine.  Is possible she might have a little costochondritis since this pain in area of swelling is localized.  We will still get her back in 6 months time.    [name], MD 1/6/20218:11 AM

## 2019-08-18 ENCOUNTER — Other Ambulatory Visit: Payer: Self-pay

## 2019-08-18 ENCOUNTER — Ambulatory Visit
Admission: RE | Admit: 2019-08-18 | Discharge: 2019-08-18 | Disposition: A | Discharge status: Home / Self Care | Payer: BC Managed Care – PPO | Source: Ambulatory Visit | Attending: Hematology & Oncology | Admitting: Hematology & Oncology

## 2019-08-18 DIAGNOSIS — D0512 Intraductal carcinoma in situ of left breast: Secondary | ICD-10-CM

## 2019-09-05 ENCOUNTER — Other Ambulatory Visit: Payer: Self-pay | Admitting: Neurology

## 2020-01-23 ENCOUNTER — Telehealth: Payer: Self-pay | Admitting: Hematology & Oncology

## 2020-01-23 NOTE — Telephone Encounter (Signed)
Called and spoke with patient about appointments being moved from Dr Marin Olp over to [facility] per Provider 6/28

## 2020-01-31 ENCOUNTER — Inpatient Hospital Stay: Payer: BC Managed Care – PPO | Attending: Hematology & Oncology

## 2020-01-31 ENCOUNTER — Ambulatory Visit: Payer: BC Managed Care – PPO | Admitting: Family

## 2020-01-31 ENCOUNTER — Encounter: Payer: Self-pay | Admitting: Family

## 2020-01-31 ENCOUNTER — Inpatient Hospital Stay: Payer: BC Managed Care – PPO | Admitting: Hematology & Oncology

## 2020-01-31 ENCOUNTER — Inpatient Hospital Stay (HOSPITAL_BASED_OUTPATIENT_CLINIC_OR_DEPARTMENT_OTHER): Payer: BC Managed Care – PPO | Admitting: Family

## 2020-01-31 ENCOUNTER — Inpatient Hospital Stay: Payer: BC Managed Care – PPO

## 2020-01-31 ENCOUNTER — Other Ambulatory Visit: Payer: Self-pay

## 2020-01-31 ENCOUNTER — Other Ambulatory Visit: Payer: BC Managed Care – PPO

## 2020-01-31 VITALS — BP 137/67 | HR 71 | Temp 99.1°F | Resp 16 | Ht 67.5 in | Wt 230.0 lb

## 2020-01-31 DIAGNOSIS — I890 Lymphedema, not elsewhere classified: Secondary | ICD-10-CM | POA: Diagnosis not present

## 2020-01-31 DIAGNOSIS — D0512 Intraductal carcinoma in situ of left breast: Secondary | ICD-10-CM

## 2020-01-31 DIAGNOSIS — D0502 Lobular carcinoma in situ of left breast: Secondary | ICD-10-CM | POA: Diagnosis not present

## 2020-01-31 DIAGNOSIS — Z9013 Acquired absence of bilateral breasts and nipples: Secondary | ICD-10-CM | POA: Insufficient documentation

## 2020-01-31 LAB — CMP (CANCER CENTER ONLY)
ALT: 20 U/L (ref 0–44)
AST: 20 U/L (ref 15–41)
Albumin: 4.4 g/dL (ref 3.5–5.0)
Alkaline Phosphatase: 75 U/L (ref 38–126)
Anion gap: 10 (ref 5–15)
BUN: 11 mg/dL (ref 6–20)
CO2: 24 mmol/L (ref 22–32)
Calcium: 8.9 mg/dL (ref 8.9–10.3)
Chloride: 100 mmol/L (ref 98–111)
Creatinine: 0.67 mg/dL (ref 0.44–1.00)
GFR, Est AFR Am: >60 mL/min (ref >60)
GFR, Estimated: >60 mL/min (ref >60)
Glucose, Bld: 91 mg/dL (ref 70–99)
Potassium: 3.7 mmol/L (ref 3.5–5.1)
Sodium: 134 mmol/L — ABNORMAL LOW (ref 135–145)
Total Bilirubin: 0.5 mg/dL (ref 0.3–1.2)
Total Protein: 7.8 g/dL (ref 6.5–8.1)

## 2020-01-31 LAB — CBC WITH DIFFERENTIAL (CANCER CENTER ONLY)
Abs Immature Granulocytes: 0.01 10*3/uL (ref 0.00–0.07)
Basophils Absolute: 0 10*3/uL (ref 0.0–0.1)
Basophils Relative: 1 %
Eosinophils Absolute: 0.1 10*3/uL (ref 0.0–0.5)
Eosinophils Relative: 2 %
HCT: 42.7 % (ref 36.0–46.0)
Hemoglobin: 14.1 g/dL (ref 12.0–15.0)
Immature Granulocytes: 0 %
Lymphocytes Relative: 47 %
Lymphs Abs: 2.7 10*3/uL (ref 0.7–4.0)
MCH: 29.9 pg (ref 26.0–34.0)
MCHC: 33 g/dL (ref 30.0–36.0)
MCV: 90.7 fL (ref 80.0–100.0)
Monocytes Absolute: 0.5 10*3/uL (ref 0.1–1.0)
Monocytes Relative: 10 %
Neutro Abs: 2.2 10*3/uL (ref 1.7–7.7)
Neutrophils Relative %: 40 %
Platelet Count: 258 10*3/uL (ref 150–400)
RBC: 4.71 MIL/uL (ref 3.87–5.11)
RDW: 12.3 % (ref 11.5–15.5)
WBC Count: 5.6 10*3/uL (ref 4.0–10.5)
nRBC: 0 % (ref 0.0–0.2)

## 2020-01-31 NOTE — Progress Notes (Signed)
Hematology and Oncology Follow Up Visit  [name] [id number] 1965/09/15 54 y.o. 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 She is doing well but states that she went to [facility] last week for a fitting and they told her she had some mile lymphedema under her arms.  Her breast exam today was negative but I did note a little fullness un both axilla. No adenopathy noted but it does feel like some mild fluid retention. She is agreeable to an eval with the lymphedema clinic.  No fever, chills, n/v, cough, rash, dizziness, SOB, chest pain, palpitations, abdominal pain or changes in bowel or bladder habits.  She notes GERD at night and is off her medication. She plans to call and schedule an e-visit with her PCP.  She has diarrhea and bright red blood at times from hemorrhoids when straining.  No swelling or tendernessin her extremities.  She has numbness and tingling in her hands that comes and goes.  No falls or syncope.  She has maintained a good appetite and is staying well hydrated. Her weight is stable.   ECOG Performance Status: 1 - Symptomatic but completely ambulatory  Medications:  Allergies as of 01/31/2020      Reactions   Penicillins Rash      Medication List       Accurate as of January 31, 2020  3:16 PM. If you have any questions, ask your nurse or doctor.        STOP taking these medications   Nurtec 75 MG Tbdp Generic drug: Rimegepant Sulfate Stopped by: Stephanie Peace, NP     TAKE these medications   Aimovig 140 MG/ML Soaj Generic drug: Erenumab-aooe ADMINISTER 1 ML UNDER THE SKIN EVERY 30 DAYS   levothyroxine 125 MCG tablet Commonly known as: SYNTHROID Take 125 mcg by mouth daily before breakfast.   magnesium oxide 400 MG tablet Commonly known as: MAG-OX Take 400 mg by  mouth daily.   multivitamin tablet Take 1 tablet by mouth daily.   ondansetron 4 MG disintegrating tablet Commonly known as: ZOFRAN-ODT Take 1 tablet (4 mg total) by mouth every 8 (eight) hours as needed for nausea.   rizatriptan 10 MG disintegrating tablet Commonly known as: MAXALT-MLT Take 1 tab at onset of migraine.  May repeat in 2 hrs, if needed.  Max dose: 2 tabs/day. This is a 30 day prescription.   tiZANidine 4 MG tablet Commonly known as: Zanaflex Take 1 tablet (4 mg total) by mouth every 6 (six) hours as needed for muscle spasms.       Allergies:  Allergies  Allergen Reactions  . Penicillins Rash    Past Medical History, Surgical history, Social history, and Family History were reviewed and updated.  Review of Systems: All other 10 point review of systems is negative.   Physical Exam:  vitals were not taken for this visit.   Wt Readings from Last 3 Encounters:  08/03/19 207 lb (93.9 kg)  03/24/19 207 lb (93.9 kg)  01/31/19 210 lb (95.3 kg)    Ocular: Sclerae unicteric, pupils equal, round and reactive to light Ear-nose-throat: Oropharynx clear, dentition fair Lymphatic: No cervical or supraclavicular adenopathy, has minimal lymphedema under both axilla  Lungs no rales or rhonchi, good excursion bilaterally Heart regular rate and rhythm, no murmur appreciated  Abd soft, nontender, positive bowel sounds MSK no focal spinal tenderness, no joint edema Neuro: non-focal, well-oriented, appropriate affect Breasts: No changes in bilateral breast exam. No rash, lesion or mass noted.   Lab Results  Component Value Date   WBC 5.6 01/31/2020   HGB 14.1 01/31/2020   HCT 42.7 01/31/2020   MCV 90.7 01/31/2020   PLT 258 01/31/2020   No results found for: FERRITIN, IRON, TIBC, UIBC, IRONPCTSAT Lab Results  Component Value Date   RBC 4.71 01/31/2020   No results found for: KPAFRELGTCHN, LAMBDASER, KAPLAMBRATIO No results found for: IGGSERUM, IGA, IGMSERUM No  results found for: Odetta Pink, SPEI   Chemistry      Component Value Date/Time   NA 141 08/03/2019 0756   NA 139 01/29/2017 0747   NA 138 05/30/2016 0746   K 4.1 08/03/2019 0756   K 3.8 01/29/2017 0747   K 3.8 05/30/2016 0746   CL 104 08/03/2019 0756   CL 105 01/29/2017 0747   CO2 29 08/03/2019 0756   CO2 30 01/29/2017 0747   CO2 25 05/30/2016 0746   BUN 15 08/03/2019 0756   BUN 7 01/29/2017 0747   BUN 10.0 05/30/2016 0746   CREATININE 0.72 08/03/2019 0756   CREATININE 0.8 01/29/2017 0747   CREATININE 0.7 05/30/2016 0746      Component Value Date/Time   CALCIUM 9.8 08/03/2019 0756   CALCIUM 9.3 01/29/2017 0747   CALCIUM 9.2 05/30/2016 0746   ALKPHOS 71 08/03/2019 0756   ALKPHOS 70 01/29/2017 0747   ALKPHOS 71 05/30/2016 0746   AST 17 08/03/2019 0756   AST 16 05/30/2016 0746   ALT 22 08/03/2019 0756   ALT 18 01/29/2017 0747   ALT 11 05/30/2016 0746   BILITOT 0.5 08/03/2019 0756   BILITOT 0.83 05/30/2016 0746       Impression and Plan: [name] is a very pleasant 54 yo caucasian female with multifocal ductal carcinoma in situ of the left breast. She had bilateral mastectomies and so far there has been no evidence of recurrence.  We will get her set up with the lymphedema clinic.  We will plan to see her again in another 6 months.  She will contact our office with any questions or concerns. We can certainly see her sooner if needed.   Stephanie Peace, NP 7/6/20213:16 PM

## 2020-02-13 ENCOUNTER — Ambulatory Visit: Payer: BC Managed Care – PPO | Attending: Family | Admitting: Physical Therapy

## 2020-02-13 ENCOUNTER — Other Ambulatory Visit: Payer: Self-pay

## 2020-02-13 ENCOUNTER — Encounter: Payer: Self-pay | Admitting: Physical Therapy

## 2020-02-13 DIAGNOSIS — R293 Abnormal posture: Secondary | ICD-10-CM | POA: Insufficient documentation

## 2020-02-13 DIAGNOSIS — D0512 Intraductal carcinoma in situ of left breast: Secondary | ICD-10-CM | POA: Diagnosis present

## 2020-02-13 NOTE — Therapy (Signed)
[facility] [place], [place], [address] Phone: [phone]   Fax:  [phone]  Physical Therapy Evaluation  Patient Details  Name: [name] MRN: [id number] Date of Birth: October 17, 1965 Referring Provider (PT): Laverna Peace   Encounter Date: 02/13/2020   PT End of Session - 02/13/20 1342    Visit Number 1    Number of Visits 2    Date for PT Re-Evaluation 04/09/20    PT Start Time 1304    PT Stop Time 1340    PT Time Calculation (min) 36 min    Activity Tolerance Patient tolerated treatment well    Behavior During Therapy [facility] for tasks assessed/performed           Past Medical History:  Diagnosis Date  . Abrasion of right arm 12/25/2016  . Complication of anesthesia    panic attack while waking up  . Detached retina, right 08/25/2018  . History of breast cancer 2014  . Hypothyroidism   . Migraines   . PONV (postoperative nausea and vomiting)     Past Surgical History:  Procedure Laterality Date  . BREAST LUMPECTOMY WITH NEEDLE LOCALIZATION Left 06/20/2013   Procedure: BREAST LUMPECTOMY WITH NEEDLE LOCALIZATION;  Surgeon: Merrie Roof, MD;  Location: [place];  Service: General;  Laterality: Left;  . BREAST RECONSTRUCTION Left 04/09/2017   Procedure: REVISION BREAST RECONSTRUCTION;  Surgeon: Wallace Going, DO;  Location: [place];  Service: Plastics;  Laterality: Left;  . CATARACT EXTRACTION Right 03/22/2019  . LAPAROSCOPIC VAGINAL HYSTERECTOMY WITH SALPINGO OOPHORECTOMY Bilateral 06/17/2016   Procedure: LAPAROSCOPIC ASSISTED VAGINAL HYSTERECTOMY WITH SALPINGO OOPHORECTOMY;  Surgeon: Arvella Nigh, MD;  Location: [place];  Service: Gynecology;  Laterality: Bilateral;  need bed  . LIPOSUCTION WITH LIPOFILLING Left 04/09/2017   Procedure: LIPOFILLING TO THE LEFT UPPER BREAST TO IMPROVE SYMMETRY.;  Surgeon: Wallace Going, DO;   Location: [place];  Service: Plastics;  Laterality: Left;  Marland Kitchen MASTECTOMY W/ SENTINEL NODE BIOPSY Bilateral 09/08/2016   Procedure: LEFT MASTECTOMY WITH LEFT SENTINEL LYMPH NODE BIOPSY, RIGHT PROPHYLACTIC MASTECTOMY;  Surgeon: Autumn Messing III, MD;  Location: [facility];  Service: General;  Laterality: Bilateral;  . PALATE TO GINGIVA GRAFT    . REMOVAL OF BILATERAL TISSUE EXPANDERS WITH PLACEMENT OF BILATERAL BREAST IMPLANTS Bilateral 01/01/2017   Procedure: REMOVAL OF BILATERAL TISSUE EXPANDERS WITH PLACEMENT OF BILATERAL SILICONE BREAST IMPLANTS WITH LIPOSUCTION;  Surgeon: Wallace Going, DO;  Location: [place];  Service: Plastics;  Laterality: Bilateral;  . RETINAL DETACHMENT SURGERY Right 08/25/2018  . TISSUE EXPANDER PLACEMENT Bilateral 09/08/2016   Procedure: IMMEDIATE PLACEMENT OF BILATERAL TISSUE EXPANDERS AFTER MASTECTOMIES;  Surgeon: Loel Lofty Dillingham, DO;  Location: [place];  Service: Plastics;  Laterality: Bilateral;    There were no vitals filed for this visit.    Subjective Assessment - 02/13/20 1305    Subjective I started feeling the heaviness about 6 months ago but I did not think anything of it. I thought maybe it was just overuse of that arm. I went to get fitted for a bra and that is when they noticed it.    Pertinent History migraines, hypothyroidism, hx of breast CA, B mastectomies 09/08/16 with reconstruction and implant placement, abrasion of R arm    Patient Stated Goals to get rid of lymphedema, prevent it from coming back or getting worse    Currently in Pain? Yes  Pain Score 1     Pain Location Shoulder    Pain Orientation Right    Pain Descriptors / Indicators Heaviness    Pain Type Chronic pain    Pain Onset More than a month ago    Pain Frequency Intermittent    Aggravating Factors  moving it, sleeping on it    Pain Relieving Factors nothing particular    Effect of Pain on Daily Activities very minimal effect, at work has to  carry laptop and case files in and out of car and sometimes that is difficult              [facility] PT Assessment - 02/13/20 0001      Assessment   Medical Diagnosis left breast cancer    Referring Provider (PT) Laverna Peace    Onset Date/Surgical Date 09/08/16    Hand Dominance Right    Prior Therapy PT for cording and ROM in 2019      Precautions   Precautions Other (comment)    Precaution Comments lymphedema      Restrictions   Weight Bearing Restrictions No      Balance Screen   Has the patient fallen in the past 6 months No    Has the patient had a decrease in activity level because of a fear of falling?  No    Is the patient reluctant to leave their home because of a fear of falling?  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, trains and gives exams, deskwork and paper work    Leisure pt does not currently exercise      Cognition   Overall Cognitive Status Within Functional Limits for tasks assessed      Posture/Postural Control   Posture/Postural Control Postural limitations    Postural Limitations Rounded Shoulders;Forward head      AROM   Right Shoulder Flexion 142 Degrees    Right Shoulder ABduction 160 Degrees    Right Shoulder Internal Rotation 63 Degrees   discomfort   Right Shoulder External Rotation 78 Degrees    Left Shoulder Flexion 144 Degrees    Left Shoulder ABduction 163 Degrees    Left Shoulder Internal Rotation 67 Degrees    Left Shoulder External Rotation 90 Degrees             LYMPHEDEMA/ONCOLOGY QUESTIONNAIRE - 02/13/20 0001      Type   Cancer Type left breast cancer      Surgeries   Mastectomy Date 09/08/16    Sentinel Lymph Node  Biopsy Date 09/08/16    Number Lymph Nodes Removed 1      Date Lymphedema/Swelling Started   Date --   pt is unsure of onset     Treatment   Active Chemotherapy Treatment No    Past Chemotherapy Treatment No    Active Radiation Treatment No    Past Radiation Treatment No    Current Hormone Treatment No    Past Hormone Therapy No      What other symptoms do you have   Are you Having Heaviness or Tightness Yes    Are you having Pain Yes  Are you having pitting edema No    Is it Hard or Difficult finding clothes that fit No    Do you have infections No    Is there Decreased scar mobility No      Lymphedema Assessments   Lymphedema Assessments Upper extremities      Right Upper Extremity Lymphedema   15 cm Proximal to Olecranon Process 37 cm    Olecranon Process 26.1 cm    15 cm Proximal to Ulnar Styloid Process 25.2 cm    Just Proximal to Ulnar Styloid Process 15.5 cm    Across Hand at [employer] 18 cm    At [place] of 2nd Digit 6 cm      Left Upper Extremity Lymphedema   15 cm Proximal to Olecranon Process 36.5 cm    Olecranon Process 26 cm    15 cm Proximal to Ulnar Styloid Process 24.5 cm    Just Proximal to Ulnar Styloid Process 15.7 cm    Across Hand at [employer] 18.1 cm    At [place] of 2nd Digit 5.8 cm                   Objective measurements completed on examination: See above findings.               PT Education - 02/13/20 1350    Education Details anatomy and physiology of lymphatic system, lymphedema risk reduction practices, signs/symptoms of lymphedema    Person(s) Educated Patient    Methods Explanation;Handout    Comprehension Verbalized understanding            PT Short Term Goals - 04/28/18 1759      PT SHORT TERM GOAL #1   Title Patient to be independent with initial HEP.    Time 3    Period Weeks    Status Achieved             PT Long Term Goals - 02/13/20 1348      PT LONG TERM GOAL #1   Title Pt will  report no changes in circumference of LUE to allow improved comfort.    Time 8    Period Weeks    Status New    Target Date 04/09/20                  Plan - 02/13/20 1343    Clinical Impression Statement Pt presents to PT for possible swelling in LUE and lateral trunk. Pt has history of L breast cancer with bilateral mastectomy and L SLNB (1 nodes- negative) on 09/08/16. Meaurements taken today do not demonstrate any edema in LUE. Her LUE is nearly identical to RUE. When pt holds her arms outstretched there is no visible difference in lateral trunk when comparing R to L. Pt's L shoulder ROM and R shoulder ROM are similar. She does have R shoulder pain that is worsened by certain positions. Pt does not wish to address that in therapy at this time. Currently pt has not needs for skilled PT services at this time. Will leave her plan of care open for 8 weeks in case pt notices any changes. Educated pt on how to measure her UEs to assess for swelling and educated pt on lymphedema risk reduction practices.    Stability/Clinical Decision Making Stable/Uncomplicated    Clinical Decision Making Low    Rehab Potential Good    PT Frequency Other (comment)   eval and f/u in 8 weeks  PT Treatment/Interventions ADLs/Self Care Home Management;Patient/family education    PT Next Visit Plan reassess for swelling and ROM, shoulder pain    Consulted and Agree with Plan of Care Patient           Patient will benefit from skilled therapeutic intervention in order to improve the following deficits and impairments:  Pain, Postural dysfunction, Decreased knowledge of precautions  Visit Diagnosis: Abnormal posture  Intraductal carcinoma in situ of left breast     Problem List Patient Active Problem List   Diagnosis Date Noted  . Chronic migraine without aura, with intractable migraine, so stated, with status migrainosus 09/23/2018  . S/P mastectomy, bilateral 07/06/2018  . S/P breast  reconstruction, bilateral 07/06/2018  . Gastroesophageal reflux disease 12/31/2017  . Hypothyroidism 12/31/2017  . History of breast cancer 12/31/2017  . Hot flashes 12/31/2017  . Depression 12/31/2017  . Other osteoporosis without current pathological fracture 01/29/2017  . Acquired absence of bilateral breasts and nipples 09/23/2016  . Breast cancer ([place]) 09/08/2016  . 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 S/P laparoscopic assisted vaginal hysterectomy (LAVH) 06/17/2016  . Chronic migraine 12/10/2015  . Chest pain 02/10/2014  . Myalgia 02/10/2014  . Hot flashes due to tamoxifen 02/10/2014  . Neoplasm of left breast, primary tumor staging category Tis 05/19/2013    Allyson Sabal [facility] 02/13/2020, 1:51 PM  [place], [place], [address] Phone: [phone]   Fax:  [phone]  Name: [name] MRN: [id number] Date of Birth: [birth year]  Manus Gunning, PT 02/13/20 1:51 PM

## 2020-03-11 ENCOUNTER — Other Ambulatory Visit: Payer: Self-pay | Admitting: Neurology

## 2020-03-20 ENCOUNTER — Telehealth: Payer: Self-pay | Admitting: Neurology

## 2020-03-20 NOTE — Telephone Encounter (Signed)
Patient is seeing Dr Jaynee Eagles Monday 8/30. I spoke with Dr Jaynee Eagles. We will not do a PA at this time as it has been 18 months since we last saw patient and we need updated information first.

## 2020-03-20 NOTE — Telephone Encounter (Signed)
Pt's mother, Alexiya Franqui request refill for AIMOVIG 140 MG/ML [place] denied and sent back to pharmacy. Mother, Keviana Boster said pharmacy contacted physician. Mother would like a call from the nurse.

## 2020-03-25 NOTE — Progress Notes (Signed)
°[facility] ° ° ° °Provider:  Dr Ahern °Referring Provider: Dr. Yan °Primary Care Provider:  [name], [name], MD ° °CC:  Migraines ° °Interval history March 25, 2020: Patient is here for follow-up of migraines, have not seen her in close to 18 months, MRI of the brain in October 2020 (personally reviewed images) was normal. Was on Aimovig, did great, the card expired. Last time she had the Aimovig was in July, it comes in waves, she wants to stay on the Aimovig. Headaches were reduced 50%,  She tried Nurtec. She is concerned about vasoconstriction. 2 migraines per week.She was happy on the Aimovig.  ° °Interval history 5-6 headache days a month. She had cataract surgery, she sometimes sees spirals in both eyes sometimes associated within 30 minutes of a headache other times just the spirals. She smells cigarette smoke, can happen for days, it comes and goes for several years. She had side effects to sumatriptan injections makes her feel weired. The maxalt still works but she as to take 2 and sthe migraine rebounds that day. ° °HPI:  [name] is a 54 y.o. female here as requested by provider [name], [name], NP for migraines. She has tried nortriptyline, maxalt, zofran, magnesium and riboflavin, celexa, tizanidine. At least 18 headache days a month, all are migrainous and moderately-severe to severe. Ongoing for over a year at this severity and frequency.Migraines can last 24-72 hours. +nausea, +vomiting, severe and pounding, +light and sound sensitivity, starts on the left, movement makes it worse. Started in 1994. Started worsening in 30s. Currently no medication overuse.   ° °Review of Systems: °Patient complains of symptoms per HPI as well as the following symptoms: headache. Pertinent negatives and positives per HPI. All others negative. ° ° °Social History  ° °Socioeconomic History  °• Marital status: Married  °  Spouse name: Jeff  °• Number of children: 0  °• Years of education:  Masters  °• Highest education level: Not on file  °Occupational History  °• Occupation: Medical Records  °Tobacco Use  °• Smoking status: Never Smoker  °• Smokeless tobacco: Never Used  °Vaping Use  °• Vaping Use: Never used  °Substance and Sexual Activity  °• Alcohol use: Yes  °  Alcohol/week: 0.0 standard drinks  °  Comment: occasionally   °• Drug use: No  °• Sexual activity: Yes  °Other Topics Concern  °• Not on file  °Social History Narrative  ° Lives at home with husband.  ° Right-handed.  ° 1 cups caffeine daily.  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  ° Has a step daughter/grandson  ° One dog  ° °Social Determinants of Health  ° °Financial Resource Strain:   °• Difficulty of Paying Living Expenses: Not on file  °Food Insecurity:   °• Worried About Running Out of Food in the Last Year: Not on file  °• Ran Out of Food in the Last Year: Not on file  °Transportation Needs:   °• Lack of Transportation (Medical): Not on file  °• Lack of Transportation (Non-Medical): Not on file  °Physical Activity:   °• Days of Exercise per Week: Not on file  °• Minutes of Exercise per Session: Not on file  °Stress:   °• Feeling of Stress : Not on file  °Social Connections:   °• Frequency of Communication with Friends and Family: Not on file  °• Frequency of Social Gatherings with Friends and Family: Not on file  °•   Attends Religious Services: Not on file  °• Active Member of Clubs or Organizations: Not on file  °• Attends Club or Organization Meetings: Not on file  °• Marital Status: Not on file  °Intimate Partner Violence:   °• Fear of Current or Ex-Partner: Not on file  °• Emotionally Abused: Not on file  °• Physically Abused: Not on file  °• Sexually Abused: Not on file  ° ° °Family History  °Problem Relation Age of Onset  °• Breast cancer Mother 53  °• Heart disease Mother   °     Atrial fibrillation  °• Melanoma Father 70  °• COPD Father   °• Arthritis Father   °• Dementia Father    °• Pneumonia Father   °• Breast cancer Sister 48  °     ER+/her2-; onco score = 8  °• Cancer Maternal Aunt   °     oral cancer; smoker  °• Head & neck cancer Paternal Uncle   °     smoker  °• Stomach cancer Maternal Uncle   °• Breast cancer Cousin   °     dx in her late 50s-60s  ° ° °Past Medical History:  °Diagnosis Date  °• Abrasion of right arm 12/25/2016  °• Complication of anesthesia   ° panic attack while waking up  °• Detached retina, right 08/25/2018  °• History of breast cancer 2014  °• Hypothyroidism   °• Migraines   °• PONV (postoperative nausea and vomiting)   ° ° °Patient Active Problem List  ° Diagnosis Date Noted  °• Chronic migraine without aura, with intractable migraine, so stated, with status migrainosus 09/23/2018  °• S/P mastectomy, bilateral 07/06/2018  °• S/P breast reconstruction, bilateral 07/06/2018  °• Gastroesophageal reflux disease 12/31/2017  °• Hypothyroidism 12/31/2017  °• History of breast cancer 12/31/2017  °• Hot flashes 12/31/2017  °• Depression 12/31/2017  °• Other osteoporosis without current pathological fracture 01/29/2017  °• Acquired absence of bilateral breasts and nipples 09/23/2016  °• Breast cancer (HCC) 09/08/2016  °• 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/2016  °• S/P laparoscopic assisted vaginal hysterectomy (LAVH) 06/17/2016  °• Chronic migraine 12/10/2015  °• Chest pain 02/10/2014  °• Myalgia 02/10/2014  °• Hot flashes due to tamoxifen 02/10/2014  °• Neoplasm of left breast, primary tumor staging category Tis 05/19/2013  ° ° °Past Surgical History:  °Procedure Laterality Date  °• BREAST LUMPECTOMY WITH NEEDLE LOCALIZATION Left 06/20/2013  ° Procedure: BREAST LUMPECTOMY WITH NEEDLE LOCALIZATION;  Surgeon: Paul S Toth III, MD;  Location: [facility];  Service: General;  Laterality: Left;  °• BREAST RECONSTRUCTION Left 04/09/2017  ° Procedure: REVISION BREAST RECONSTRUCTION;  Surgeon: Dillingham, Claire S, DO;  Location: [facility];  Service: Plastics;  Laterality: Left;  °• CATARACT EXTRACTION Right 03/22/2019  °• LAPAROSCOPIC VAGINAL HYSTERECTOMY WITH SALPINGO OOPHORECTOMY Bilateral 06/17/2016  ° Procedure: LAPAROSCOPIC ASSISTED VAGINAL HYSTERECTOMY WITH SALPINGO OOPHORECTOMY;  Surgeon: [name] [name], MD;  Location: [facility];  Service: Gynecology;  Laterality: Bilateral;  need bed  °• LIPOSUCTION WITH LIPOFILLING Left 04/09/2017  ° Procedure: LIPOFILLING TO THE LEFT UPPER BREAST TO IMPROVE SYMMETRY.;  Surgeon: Dillingham, Claire S, DO;  Location: [facility];  Service: Plastics;  Laterality: Left;  °• MASTECTOMY W/ SENTINEL NODE BIOPSY Bilateral 09/08/2016  ° Procedure: LEFT MASTECTOMY WITH LEFT SENTINEL LYMPH NODE BIOPSY, RIGHT PROPHYLACTIC MASTECTOMY;  Surgeon: Paul Toth III, MD;  Location: [facility];  Service: General;  Laterality:   Bilateral;  °• PALATE TO GINGIVA GRAFT    °• REMOVAL OF BILATERAL TISSUE EXPANDERS WITH PLACEMENT OF BILATERAL BREAST IMPLANTS Bilateral 01/01/2017  ° Procedure: REMOVAL OF BILATERAL TISSUE EXPANDERS WITH PLACEMENT OF BILATERAL SILICONE BREAST IMPLANTS WITH LIPOSUCTION;  Surgeon: Dillingham, Claire S, DO;  Location: [facility];  Service: Plastics;  Laterality: Bilateral;  °• RETINAL DETACHMENT SURGERY Right 08/25/2018  °• TISSUE EXPANDER PLACEMENT Bilateral 09/08/2016  ° Procedure: IMMEDIATE PLACEMENT OF BILATERAL TISSUE EXPANDERS AFTER MASTECTOMIES;  Surgeon: Claire S Dillingham, DO;  Location: [facility];  Service: Plastics;  Laterality: Bilateral;  ° ° °Current Outpatient Medications  °Medication Sig Dispense Refill  °• Erenumab-aooe (AIMOVIG) 140 MG/ML SOAJ ADMINISTER 1 ML UNDER THE SKIN EVERY 30 DAYS 1 mL 11  °• levothyroxine (SYNTHROID, LEVOTHROID) 125 MCG tablet Take 125 mcg by mouth daily before breakfast.    °• magnesium oxide (MAG-OX) 400 MG tablet Take 400 mg by mouth daily.    °• MELATONIN PO Take 30 mg by mouth at bedtime.    °• Multiple Vitamin (MULTIVITAMIN)  tablet Take 1 tablet by mouth daily.    °• ondansetron (ZOFRAN-ODT) 4 MG disintegrating tablet Take 1 tablet (4 mg total) by mouth every 8 (eight) hours as needed for nausea. 30 tablet 11  °• rizatriptan (MAXALT-MLT) 10 MG disintegrating tablet Take 1 tab at onset of migraine.  May repeat in 2 hrs, if needed.  Max dose: 2 tabs/day. This is a 30 day prescription. 9 tablet 11  °• tiZANidine (ZANAFLEX) 4 MG tablet Take 1 tablet (4 mg total) by mouth every 6 (six) hours as needed for muscle spasms. 30 tablet 11  °• Ubrogepant (UBRELVY) 100 MG TABS Take 100 mg by mouth every 2 (two) hours as needed. Maximum 200mg a day. 10 tablet 0  ° °No current facility-administered medications for this visit.  ° ° °Allergies as of 03/26/2020 - Review Complete 03/26/2020  °Allergen Reaction Noted  °• Penicillins Rash 05/19/2013  ° ° °Vitals: °BP 116/78 (BP Location: Right Arm, Patient Position: Sitting)    Pulse 90    Ht 5' 7.5" (1.715 m)    Wt 235 lb (106.6 kg)    LMP 02/01/2014    BMI 36.26 kg/m²  °Last Weight:  °Wt Readings from Last 1 Encounters:  °03/26/20 235 lb (106.6 kg)  ° °Last Height:   °Ht Readings from Last 1 Encounters:  °03/26/20 5' 7.5" (1.715 m)  ° ° °Physical exam: °Exam: °Gen: NAD, conversant, well nourised, obese, well groomed                     °CV: RRR, no MRG. No Carotid Bruits. No peripheral edema, warm, nontender °Eyes: Conjunctivae clear without exudates or hemorrhage ° °Neuro: °Detailed Neurologic Exam ° °Speech: °   Speech is normal; fluent and spontaneous with normal comprehension.  °Cognition: °   The patient is oriented to person, place, and time;  °   recent and remote memory intact;  °   language fluent;  °   normal attention, concentration,  °   fund of knowledge °Cranial Nerves: °   The pupils are equal, round, and reactive to light. The fundi are normal and spontaneous venous pulsations are present. Visual fields are full to finger confrontation. Extraocular movements are intact. Trigeminal  sensation is intact and the muscles of mastication are normal. The face is symmetric. The palate elevates in the midline. Hearing intact. Voice is normal. Shoulder shrug is normal. The tongue has   normal motion without fasciculations.  ° °Coordination: °   Normal finger to nose and heel to shin. Normal rapid alternating movements.  ° °Gait: °   Heel-toe and tandem gait are normal.  ° °Motor Observation: °   No asymmetry, no atrophy, and no involuntary movements noted. °Tone: °   Normal muscle tone.   ° °Posture: °   Posture is normal. normal erect °   °Strength: °   Strength is V/V in the upper and lower limbs.  °    °Sensation: intact to LT °    °Reflex Exam: ° °DTR's: °   Deep tendon reflexes in the upper and lower extremities are normal bilaterally.   °Toes: °   The toes are downgoing bilaterally.   °Clonus: °   Clonus is absent. °  ° °Assessment/Plan:  Patient with chronic migraines failed multiple medications. Disussed options for patient. Doing exceptionally well on CGRp Aimovig will refill. Acute takes rizatrip, did not like nurtec, will try Ubrelvy. ° ° °MRI brain w/wo contrast - olfactory hallucinations resolved MRI brain. MRI brain normal, reviewed images. ° °Discussed amerge in the past, more long acting can try that but will give nurtec and can use it with the maxalt or alone.  °Continue Aimovig -  °At onset of migraine: Maxalt and Ubrelvy ° °Meds ordered this encounter  °Medications  °• Ubrogepant (UBRELVY) 100 MG TABS  °  Sig: Take 100 mg by mouth every 2 (two) hours as needed. Maximum 200mg a day.  °  Dispense:  10 tablet  °  Refill:  0  °• rizatriptan (MAXALT-MLT) 10 MG disintegrating tablet  °  Sig: Take 1 tab at onset of migraine.  May repeat in 2 hrs, if needed.  Max dose: 2 tabs/day. This is a 30 day prescription.  °  Dispense:  9 tablet  °  Refill:  11  °  Dispense 9 or maximum allowed by insurance  °• Erenumab-aooe (AIMOVIG) 140 MG/ML SOAJ  °  Sig: ADMINISTER 1 ML UNDER THE SKIN EVERY 30 DAYS  °   Dispense:  1 mL  °  Refill:  11  °• ondansetron (ZOFRAN-ODT) 4 MG disintegrating tablet  °  Sig: Take 1 tablet (4 mg total) by mouth every 8 (eight) hours as needed for nausea.  °  Dispense:  30 tablet  °  Refill:  11  °• tiZANidine (ZANAFLEX) 4 MG tablet  °  Sig: Take 1 tablet (4 mg total) by mouth every 6 (six) hours as needed for muscle spasms.  °  Dispense:  30 tablet  °  Refill:  11  ° °No orders of the defined types were placed in this encounter. ° ° ° °Discussed: To prevent or relieve headaches, try the following: °Cool Compress. Lie down and place a cool compress on your head.  °Avoid headache triggers. If certain foods or odors seem to have triggered your migraines in the past, avoid them. A headache diary might help you identify triggers.  °Include physical activity in your daily routine. Try a daily walk or other moderate aerobic exercise.  °Manage stress. Find healthy ways to cope with the stressors, such as delegating tasks on your to-do list.  °Practice relaxation techniques. Try deep breathing, yoga, massage and visualization.  °Eat regularly. Eating regularly scheduled meals and maintaining a healthy diet might help prevent headaches. Also, drink plenty of fluids.  °Follow a regular sleep schedule. Sleep deprivation might contribute to headaches °Consider biofeedback. With this mind-body technique, you   mind-body technique, you learn to control certain bodily functions -- such as muscle tension, heart rate and blood pressure -- to prevent headaches or reduce headache pain.    Proceed to emergency room if you experience new or worsening symptoms or symptoms do not resolve, if you have new neurologic symptoms or if headache is severe, or for any concerning symptom.   Provided education and documentation from American headache Society toolbox including articles on: chronic migraine medication overuse headache, chronic migraines, prevention of migraines, behavioral and other nonpharmacologic treatments for headache.  Cc:  Debbrah Alar, NP,    Please take one tablet at the onset of your headache. If it does not improve the symptoms please take one additional tablet. Do not take more then 2 tablets in 24hrs. Do not take use more then 2 to 3 times in a week.   Sarina Ill, MD  [facility] [address] [place], [place] [address]  Phone [phone] Fax [phone]  I spent more than 25 minutes of face-to-face and non-face-to-face time with patient on the  1. Chronic migraine without aura, with intractable migraine, so stated, with status migrainosus    diagnosis.  This included previsit chart review, lab review, study review, order entry, electronic health record documentation, patient education on the different diagnostic and therapeutic options, counseling and coordination of care, risks and benefits of management, compliance, or risk factor reduction

## 2020-03-26 ENCOUNTER — Ambulatory Visit (INDEPENDENT_AMBULATORY_CARE_PROVIDER_SITE_OTHER): Payer: BC Managed Care – PPO | Admitting: Neurology

## 2020-03-26 ENCOUNTER — Encounter: Payer: Self-pay | Admitting: Neurology

## 2020-03-26 ENCOUNTER — Other Ambulatory Visit: Payer: Self-pay

## 2020-03-26 VITALS — BP 116/78 | HR 90 | Ht 67.5 in | Wt 235.0 lb

## 2020-03-26 DIAGNOSIS — G43711 Chronic migraine without aura, intractable, with status migrainosus: Secondary | ICD-10-CM | POA: Diagnosis not present

## 2020-03-26 MED ORDER — RIZATRIPTAN BENZOATE 10 MG PO TBDP: ORAL_TABLET | ORAL | 11 refills | Status: DC

## 2020-03-26 MED ORDER — TIZANIDINE HCL 4 MG PO TABS: 4.0000 mg | ORAL_TABLET | Freq: Four times a day (QID) | ORAL | 11 refills | Status: DC | PRN

## 2020-03-26 MED ORDER — UBRELVY 100 MG PO TABS
100.0000 mg | ORAL_TABLET | ORAL | 0 refills | Status: DC | PRN
Start: 2020-03-26 — End: 2020-09-07

## 2020-03-26 MED ORDER — AIMOVIG 140 MG/ML ~~LOC~~ SOAJ: SUBCUTANEOUS | 11 refills | Status: DC

## 2020-03-26 MED ORDER — ONDANSETRON 4 MG PO TBDP: 4.0000 mg | ORAL_TABLET | Freq: Three times a day (TID) | ORAL | 11 refills | Status: DC | PRN

## 2020-03-26 NOTE — Patient Instructions (Signed)
Ubrogepant tablets What is this medicine? UBROGEPANT (ue BROE je pant) is used to treat migraine headaches with or without aura. An aura is a strange feeling or visual disturbance that warns you of an attack. It is not used to prevent migraines. This medicine may be used for other purposes; ask your health care provider or pharmacist if you have questions. COMMON BRAND NAME(S): Roselyn Meier What should I tell my health care provider before I take this medicine? They need to know if you have any of these conditions:  kidney disease  liver disease  an unusual or allergic reaction to ubrogepant, other medicines, foods, dyes, or preservatives  pregnant or trying to get pregnant  breast-feeding How should I use this medicine? Take this medicine by mouth with a glass of water. Follow the directions on the prescription label. You can take it with or without food. If it upsets your stomach, take it with food. Take your medicine at regular intervals. Do not take it more often than directed. Do not stop taking except on your doctor's advice. Talk to your pediatrician about the use of this medicine in children. Special care may be needed. Overdosage: If you think you have taken too much of this medicine contact a poison control center or emergency room at once. NOTE: This medicine is only for you. Do not share this medicine with others. What if I miss a dose? This does not apply. This medicine is not for regular use. What may interact with this medicine? Do not take this medicine with any of the following medicines:  ceritinib  certain antibiotics like chloramphenicol, clarithromycin, telithromycin  certain antivirals for HIV like atazanavir, cobicistat, darunavir, delavirdine, fosamprenavir, indinavir, ritonavir  certain medicines for fungal infections like itraconazole, ketoconazole, posaconazole,  voriconazole  conivaptan  grapefruit  idelalisib  mifepristone  nefazodone  ribociclib This medicine may also interact with the following medications:  carvedilol  certain medicines for seizures like phenobarbital, phenytoin  ciprofloxacin  cyclosporine  eltrombopag  fluconazole  fluvoxamine  quinidine  rifampin  St. [name]'s wort  verapamil This list may not describe all possible interactions. Give your health care provider a list of all the medicines, herbs, non-prescription drugs, or dietary supplements you use. Also tell them if you smoke, drink alcohol, or use illegal drugs. Some items may interact with your medicine. What should I watch for while using this medicine? Visit your health care professional for regular checks on your progress. Tell your health care professional if your symptoms do not start to get better or if they get worse. Your mouth may get dry. Chewing sugarless gum or sucking hard candy and drinking plenty of water may help. Contact your health care professional if the problem does not go away or is severe. What side effects may I notice from receiving this medicine? Side effects that you should report to your doctor or health care professional as soon as possible:  allergic reactions like skin rash, itching or hives; swelling of the face, lips, or tongue Side effects that usually do not require medical attention (report these to your doctor or health care professional if they continue or are bothersome):  drowsiness  dry mouth  nausea  tiredness This list may not describe all possible side effects. Call your doctor for medical advice about side effects. You may report side effects to FDA at 1-800-FDA-1088. Where should I keep my medicine? Keep out of the reach of children. Store at room temperature between 15 and 30 degrees C (59  and 86 degrees F). Throw away any unused medicine after the expiration date. NOTE: This sheet is a summary. It  may not cover all possible information. If you have questions about this medicine, talk to your doctor, pharmacist, or health care provider.  2020 Elsevier/Gold Standard (2018-09-30 08:50:55)

## 2020-05-08 ENCOUNTER — Telehealth: Payer: Self-pay | Admitting: Neurology

## 2020-05-08 NOTE — Telephone Encounter (Signed)
PA submitted through cover my meds. [id number]. Should receive response within 24 hrs

## 2020-05-09 NOTE — Telephone Encounter (Signed)
Received approval from [facility]. Aimovig approved 04/08/20-05/08/21. I faxed approval notice to pt's pharmacy. Received a receipt of confirmation.

## 2020-05-24 ENCOUNTER — Telehealth: Payer: Self-pay | Admitting: Neurology

## 2020-05-24 ENCOUNTER — Encounter: Payer: Self-pay | Admitting: *Deleted

## 2020-05-24 NOTE — Telephone Encounter (Signed)
Pt's husband, [name] (on DPR) called,has 2 insurances,primary: [employer] employee, secondary:[employer]. Prior approval for Aimovig was approved by the wrong insurance, [employer] employee. Need approval from [facility] employee because they cover her medications. Would like a call from the nurse.

## 2020-05-24 NOTE — Telephone Encounter (Signed)
Called [employer] state health employee's plan, spoke with Wyatt Portela who initiated a new PA under state employee plan, will fax over PA form.

## 2020-05-24 NOTE — Telephone Encounter (Signed)
Received Aimovig PA form, filed out and signed by MD, faxed to [place].  Received confirmation.

## 2020-05-28 NOTE — Telephone Encounter (Signed)
[facility] faxed approval to our office. Aimovig 140 mg has been approved form 05/24/20-05/24/21. I faxed the approval notice to pt's pharmacy and updated patient via mychart. Received a receipt of confirmation.

## 2020-08-02 ENCOUNTER — Inpatient Hospital Stay: Payer: Federal, State, Local not specified - PPO | Admitting: Family

## 2020-08-02 ENCOUNTER — Inpatient Hospital Stay: Payer: Federal, State, Local not specified - PPO

## 2020-08-30 ENCOUNTER — Ambulatory Visit: Payer: Self-pay | Admitting: Family

## 2020-08-30 ENCOUNTER — Inpatient Hospital Stay: Payer: Federal, State, Local not specified - PPO

## 2020-09-07 ENCOUNTER — Telehealth: Payer: Self-pay | Admitting: *Deleted

## 2020-09-07 ENCOUNTER — Other Ambulatory Visit: Payer: Self-pay

## 2020-09-07 ENCOUNTER — Inpatient Hospital Stay (HOSPITAL_BASED_OUTPATIENT_CLINIC_OR_DEPARTMENT_OTHER): Payer: BC Managed Care – PPO | Admitting: Family

## 2020-09-07 ENCOUNTER — Encounter: Payer: Self-pay | Admitting: Family

## 2020-09-07 ENCOUNTER — Inpatient Hospital Stay: Payer: BC Managed Care – PPO | Attending: Hematology & Oncology

## 2020-09-07 VITALS — BP 124/80 | HR 80 | Temp 97.8°F | Resp 18 | Ht 67.5 in | Wt 233.8 lb

## 2020-09-07 DIAGNOSIS — Z79899 Other long term (current) drug therapy: Secondary | ICD-10-CM | POA: Diagnosis not present

## 2020-09-07 DIAGNOSIS — D0502 Lobular carcinoma in situ of left breast: Secondary | ICD-10-CM

## 2020-09-07 DIAGNOSIS — Z86000 Personal history of in-situ neoplasm of breast: Secondary | ICD-10-CM | POA: Insufficient documentation

## 2020-09-07 DIAGNOSIS — Z9013 Acquired absence of bilateral breasts and nipples: Secondary | ICD-10-CM | POA: Insufficient documentation

## 2020-09-07 DIAGNOSIS — D0512 Intraductal carcinoma in situ of left breast: Secondary | ICD-10-CM

## 2020-09-07 LAB — CMP (CANCER CENTER ONLY)
ALT: 19 U/L (ref 0–44)
AST: 18 U/L (ref 15–41)
Albumin: 4.5 g/dL (ref 3.5–5.0)
Alkaline Phosphatase: 70 U/L (ref 38–126)
Anion gap: 6 (ref 5–15)
BUN: 10 mg/dL (ref 6–20)
CO2: 30 mmol/L (ref 22–32)
Calcium: 10.3 mg/dL (ref 8.9–10.3)
Chloride: 105 mmol/L (ref 98–111)
Creatinine: 0.81 mg/dL (ref 0.44–1.00)
GFR, Estimated: >60 mL/min (ref >60)
Glucose, Bld: 112 mg/dL — ABNORMAL HIGH (ref 70–99)
Potassium: 4.3 mmol/L (ref 3.5–5.1)
Sodium: 141 mmol/L (ref 135–145)
Total Bilirubin: 0.4 mg/dL (ref 0.3–1.2)
Total Protein: 6.9 g/dL (ref 6.5–8.1)

## 2020-09-07 LAB — CBC WITH DIFFERENTIAL (CANCER CENTER ONLY)
Abs Immature Granulocytes: 0.01 10*3/uL (ref 0.00–0.07)
Basophils Absolute: 0 10*3/uL (ref 0.0–0.1)
Basophils Relative: 1 %
Eosinophils Absolute: 0.1 10*3/uL (ref 0.0–0.5)
Eosinophils Relative: 1 %
HCT: 41.9 % (ref 36.0–46.0)
Hemoglobin: 14 g/dL (ref 12.0–15.0)
Immature Granulocytes: 0 %
Lymphocytes Relative: 45 %
Lymphs Abs: 2.8 10*3/uL (ref 0.7–4.0)
MCH: 29.6 pg (ref 26.0–34.0)
MCHC: 33.4 g/dL (ref 30.0–36.0)
MCV: 88.6 fL (ref 80.0–100.0)
Monocytes Absolute: 0.5 10*3/uL (ref 0.1–1.0)
Monocytes Relative: 8 %
Neutro Abs: 2.8 10*3/uL (ref 1.7–7.7)
Neutrophils Relative %: 45 %
Platelet Count: 290 10*3/uL (ref 150–400)
RBC: 4.73 MIL/uL (ref 3.87–5.11)
RDW: 12.1 % (ref 11.5–15.5)
WBC Count: 6.2 10*3/uL (ref 4.0–10.5)
nRBC: 0 % (ref 0.0–0.2)

## 2020-09-07 NOTE — Telephone Encounter (Signed)
Gave patient upcoming appts. - mychart

## 2020-09-07 NOTE — Progress Notes (Signed)
Hematology and Oncology Follow Up Visit  [name] [id number] 1965-10-04 55 y.o. 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 She is doing well and has no complaints at this time.  She notes occasional positional musculoskeletal pain across the left breast implant. She will sleep with a pillow between her breasts when needed which helps.  Bilateral mastectomies with reconstruction intact. No mass, lesion or rash noted.  No adenopathy or lymphedema noted at this time.  No fever, chills, n/v, cough, rash, dizziness, SOB, chest pain, palpitations, abdominal pain or changes in bowel or bladder habits.  No episodes of blood loss to report. No bruising or petechiae.  No swelling, tenderness, numbness or tingling in her extremities at this time.  No falls or syncope to report.  She has maintained a good appetite and is staying well hydrated. Her weight is stable.   ECOG Performance Status: 1 - Symptomatic but completely ambulatory  Medications:  Allergies as of 09/07/2020      Reactions   Penicillins Rash      Medication List       Accurate as of September 07, 2020  3:13 PM. If you have any questions, ask your nurse or doctor.        Aimovig 140 MG/ML Soaj Generic drug: Erenumab-aooe ADMINISTER 1 ML UNDER THE SKIN EVERY 30 DAYS   levothyroxine 125 MCG tablet Commonly known as: SYNTHROID Take 125 mcg by mouth daily before breakfast.   magnesium oxide 400 MG tablet Commonly known as: MAG-OX Take 400 mg by mouth daily.   MELATONIN PO Take 30 mg by mouth at bedtime.   multivitamin tablet Take 1 tablet by mouth daily.   ondansetron 4 MG disintegrating tablet Commonly known as: ZOFRAN-ODT Take 1 tablet (4 mg total) by mouth every 8 (eight) hours as needed for nausea.   rizatriptan 10 MG disintegrating  tablet Commonly known as: MAXALT-MLT Take 1 tab at onset of migraine.  May repeat in 2 hrs, if needed.  Max dose: 2 tabs/day. This is a 30 day prescription.   tiZANidine 4 MG tablet Commonly known as: Zanaflex Take 1 tablet (4 mg total) by mouth every 6 (six) hours as needed for muscle spasms.   Ubrelvy 100 MG Tabs Generic drug: Ubrogepant Take 100 mg by mouth every 2 (two) hours as needed. Maximum 200mg  a day.       Allergies:  Allergies  Allergen Reactions  . Penicillins Rash    Past Medical History, Surgical history, Social history, and Family History were reviewed and updated.  Review of Systems: All other 10 point review of systems is negative.   Physical Exam:  vitals were not taken for this visit.   Wt Readings from Last 3 Encounters:  03/26/20 235 lb (106.6 kg)  01/31/20 230 lb (104.3 kg)  08/03/19 207 lb (93.9 kg)    Ocular: Sclerae unicteric, pupils equal, round and reactive to light Ear-nose-throat: Oropharynx clear, dentition fair Lymphatic: No cervical, supraclavicular or axillary adenopathy Lungs no rales or rhonchi, good excursion bilaterally Heart regular rate and rhythm, no murmur appreciated Abd soft, nontender, positive bowel sounds, no liver or spleen tip palpated on exam.  MSK no focal spinal tenderness, no joint edema Neuro: non-focal, well-oriented, appropriate affect Breasts: Bilateral mastectomies with reconstruction intact. No mass, lesion or rash.   Lab Results  Component Value Date  WBC 6.2 09/07/2020   HGB 14.0 09/07/2020   HCT 41.9 09/07/2020   MCV 88.6 09/07/2020   PLT 290 09/07/2020   No results found for: FERRITIN, IRON, TIBC, UIBC, IRONPCTSAT Lab Results  Component Value Date   RBC 4.73 09/07/2020   No results found for: KPAFRELGTCHN, LAMBDASER, KAPLAMBRATIO No results found for: Kandis Cocking, IGMSERUM No results found for: Odetta Pink, SPEI   Chemistry       Component Value Date/Time   NA 141 09/07/2020 1424   NA 139 01/29/2017 0747   NA 138 05/30/2016 0746   K 4.3 09/07/2020 1424   K 3.8 01/29/2017 0747   K 3.8 05/30/2016 0746   CL 105 09/07/2020 1424   CL 105 01/29/2017 0747   CO2 30 09/07/2020 1424   CO2 30 01/29/2017 0747   CO2 25 05/30/2016 0746   BUN 10 09/07/2020 1424   BUN 7 01/29/2017 0747   BUN 10.0 05/30/2016 0746   CREATININE 0.81 09/07/2020 1424   CREATININE 0.8 01/29/2017 0747   CREATININE 0.7 05/30/2016 0746      Component Value Date/Time   CALCIUM 10.3 09/07/2020 1424   CALCIUM 9.3 01/29/2017 0747   CALCIUM 9.2 05/30/2016 0746   ALKPHOS 70 09/07/2020 1424   ALKPHOS 70 01/29/2017 0747   ALKPHOS 71 05/30/2016 0746   AST 18 09/07/2020 1424   AST 16 05/30/2016 0746   ALT 19 09/07/2020 1424   ALT 18 01/29/2017 0747   ALT 11 05/30/2016 0746   BILITOT 0.4 09/07/2020 1424   BILITOT 0.83 05/30/2016 0746       Impression and Plan: [name] is a very pleasant 50 yo caucasian female with multifocal ductal carcinoma in situ of the left breast. She had bilateral mastectomies and so far there has been no evidence of recurrence.  She continues to do well and has no needs at this time.  Follow-up in 6 months.  She can contact our office with any questions or concerns.   Laverna Peace, NP 2/11/20223:13 PM

## 2020-11-07 ENCOUNTER — Telehealth: Payer: Self-pay

## 2020-11-07 NOTE — Telephone Encounter (Signed)
S/w pt and r/s her 03/07/21 appt as [name] is out of the office   [name]

## 2020-11-26 ENCOUNTER — Telehealth: Payer: Self-pay | Admitting: Neurology

## 2020-11-26 ENCOUNTER — Telehealth: Payer: Self-pay

## 2020-11-26 NOTE — Telephone Encounter (Signed)
PA resubmitted through insurance/ [employer] health plan on CMM/  [id number] Will await response

## 2020-11-26 NOTE — Telephone Encounter (Signed)
error 

## 2020-11-26 NOTE — Telephone Encounter (Signed)
Pt called stating that her insurance has made some changes and now she is being charged over a hundred dollars to get her Erenumab-aooe (AIMOVIG) 140 MG/ML SOAJ. Pt would like to know if she can change this medication to something else. Please advise.

## 2020-11-27 NOTE — Telephone Encounter (Signed)
Looks like PA for Aimovig was completed yesterday. Unsure if this will change her copay if approved but in case it is, we will see. Will update when able.

## 2020-11-29 ENCOUNTER — Encounter: Payer: Self-pay | Admitting: Family Medicine

## 2020-12-03 ENCOUNTER — Other Ambulatory Visit: Payer: Self-pay | Admitting: Neurology

## 2020-12-03 DIAGNOSIS — G43711 Chronic migraine without aura, intractable, with status migrainosus: Secondary | ICD-10-CM

## 2020-12-03 MED ORDER — AJOVY 225 MG/1.5ML ~~LOC~~ SOAJ: 225.0000 mg | SUBCUTANEOUS | 11 refills | Status: DC

## 2020-12-06 ENCOUNTER — Telehealth: Payer: Self-pay | Admitting: *Deleted

## 2020-12-06 NOTE — Addendum Note (Signed)
Addended by: Gildardo Griffes on: 12/06/2020 01:44 PM   Modules accepted: Orders

## 2020-12-06 NOTE — Telephone Encounter (Signed)
Received a fax from [place] stating prior authorization is not required for the Ajovy.

## 2020-12-06 NOTE — Telephone Encounter (Signed)
Completed Ajovy PA on Cover My Meds. Key: [place]. Awaiting determination from [place].

## 2020-12-06 NOTE — Telephone Encounter (Signed)
Update: PA not needed per plan. Per other encounter, switching to Ajovy due to cost. PA documented in separate phone note.

## 2020-12-06 NOTE — Telephone Encounter (Signed)
Message from plan: PA not needed   Looks like Dr Jaynee Eagles will have patient try Ajovy d/t cost of Aimovig.

## 2021-02-12 ENCOUNTER — Telehealth: Payer: Self-pay

## 2021-03-05 ENCOUNTER — Ambulatory Visit: Payer: BC Managed Care – PPO | Admitting: Family

## 2021-03-05 ENCOUNTER — Other Ambulatory Visit: Payer: BC Managed Care – PPO

## 2021-03-07 ENCOUNTER — Ambulatory Visit: Payer: BC Managed Care – PPO | Admitting: Family

## 2021-03-07 ENCOUNTER — Other Ambulatory Visit: Payer: BC Managed Care – PPO

## 2021-03-12 ENCOUNTER — Telehealth: Payer: Self-pay | Admitting: *Deleted

## 2021-03-12 ENCOUNTER — Encounter: Payer: Self-pay | Admitting: Family

## 2021-03-12 ENCOUNTER — Inpatient Hospital Stay: Payer: BC Managed Care – PPO | Attending: Hematology & Oncology

## 2021-03-12 ENCOUNTER — Inpatient Hospital Stay (HOSPITAL_BASED_OUTPATIENT_CLINIC_OR_DEPARTMENT_OTHER): Payer: BC Managed Care – PPO | Admitting: Family

## 2021-03-12 ENCOUNTER — Other Ambulatory Visit: Payer: Self-pay

## 2021-03-12 VITALS — BP 127/66 | HR 75 | Temp 98.3°F | Resp 17 | Wt 228.1 lb

## 2021-03-12 DIAGNOSIS — D0512 Intraductal carcinoma in situ of left breast: Secondary | ICD-10-CM | POA: Diagnosis not present

## 2021-03-12 DIAGNOSIS — Z86000 Personal history of in-situ neoplasm of breast: Secondary | ICD-10-CM | POA: Insufficient documentation

## 2021-03-12 DIAGNOSIS — Z9013 Acquired absence of bilateral breasts and nipples: Secondary | ICD-10-CM | POA: Insufficient documentation

## 2021-03-12 DIAGNOSIS — D0502 Lobular carcinoma in situ of left breast: Secondary | ICD-10-CM

## 2021-03-12 LAB — CBC WITH DIFFERENTIAL (CANCER CENTER ONLY)
Abs Immature Granulocytes: 0.01 10*3/uL (ref 0.00–0.07)
Basophils Absolute: 0 10*3/uL (ref 0.0–0.1)
Basophils Relative: 1 %
Eosinophils Absolute: 0.1 10*3/uL (ref 0.0–0.5)
Eosinophils Relative: 1 %
HCT: 42.1 % (ref 36.0–46.0)
Hemoglobin: 14.1 g/dL (ref 12.0–15.0)
Immature Granulocytes: 0 %
Lymphocytes Relative: 45 %
Lymphs Abs: 2.4 10*3/uL (ref 0.7–4.0)
MCH: 29.5 pg (ref 26.0–34.0)
MCHC: 33.5 g/dL (ref 30.0–36.0)
MCV: 88.1 fL (ref 80.0–100.0)
Monocytes Absolute: 0.5 10*3/uL (ref 0.1–1.0)
Monocytes Relative: 10 %
Neutro Abs: 2.3 10*3/uL (ref 1.7–7.7)
Neutrophils Relative %: 43 %
Platelet Count: 292 10*3/uL (ref 150–400)
RBC: 4.78 MIL/uL (ref 3.87–5.11)
RDW: 12 % (ref 11.5–15.5)
WBC Count: 5.4 10*3/uL (ref 4.0–10.5)
nRBC: 0 % (ref 0.0–0.2)

## 2021-03-12 LAB — CMP (CANCER CENTER ONLY)
ALT: 22 U/L (ref 0–44)
AST: 20 U/L (ref 15–41)
Albumin: 4.3 g/dL (ref 3.5–5.0)
Alkaline Phosphatase: 67 U/L (ref 38–126)
Anion gap: 6 (ref 5–15)
BUN: 12 mg/dL (ref 6–20)
CO2: 29 mmol/L (ref 22–32)
Calcium: 9.8 mg/dL (ref 8.9–10.3)
Chloride: 103 mmol/L (ref 98–111)
Creatinine: 0.69 mg/dL (ref 0.44–1.00)
GFR, Estimated: >60 mL/min (ref >60)
Glucose, Bld: 97 mg/dL (ref 70–99)
Potassium: 4.2 mmol/L (ref 3.5–5.1)
Sodium: 138 mmol/L (ref 135–145)
Total Bilirubin: 0.4 mg/dL (ref 0.3–1.2)
Total Protein: 6.4 g/dL — ABNORMAL LOW (ref 6.5–8.1)

## 2021-03-12 NOTE — Telephone Encounter (Signed)
Per 03/12/21 LOS - gave upcoming appointments - confirmed

## 2021-03-12 NOTE — Progress Notes (Signed)
Hematology and Oncology Follow Up Visit  [name] [id number] 04-08-1966 55 y.o. 03/12/2021   Principle Diagnosis:  Ductal carcinoma in situ of the left breast - multifocal   Current Therapy:        Bilateral mastectomies - 09/08/2016   Interim History:  [name] is here today for follow-up. She is doing quite well and has no complaints at this time.  Exam today was negative. Bilateral mastectomies with reconstruction intact. No mass, lesion or rash noted.  No adenopathy or lymphedema noted.  No changes in bowel or bladder habits.  She denies fever, chills, n/v, cough, rash, dizziness, SOB, chest pain, palpitations or abdominal pain/bloating.  No blood loss noted. No petechiae.  No c/o of neuropathy. No tenderness in her extremities.  No falls or syncopal episodes to report.  She is eating well and doing her best to stay well hydrated throughout the day. Her weight is stale at 228 lbs.   ECOG Performance Status: 0 - Asymptomatic  Medications:  Allergies as of 03/12/2021       Reactions   Penicillins Rash        Medication List        Accurate as of March 12, 2021  2:17 PM. If you have any questions, ask your nurse or doctor.          Ajovy 225 MG/1.5ML Soaj Generic drug: Fremanezumab-vfrm Inject 225 mg into the skin every 30 (thirty) days.   levothyroxine 125 MCG tablet Commonly known as: SYNTHROID Take 125 mcg by mouth daily before breakfast.   magnesium oxide 400 MG tablet Commonly known as: MAG-OX Take 400 mg by mouth daily.   MELATONIN PO Take 30 mg by mouth at bedtime.   multivitamin tablet Take 1 tablet by mouth daily.   ondansetron 4 MG disintegrating tablet Commonly known as: ZOFRAN-ODT Take 1 tablet (4 mg total) by mouth every 8 (eight) hours as needed for nausea.   rizatriptan 10 MG disintegrating tablet Commonly known as: MAXALT-MLT Take 1 tab at onset of migraine.  May repeat in 2 hrs, if needed.  Max dose: 2 tabs/day. This is a  30 day prescription.   tiZANidine 4 MG tablet Commonly known as: Zanaflex Take 1 tablet (4 mg total) by mouth every 6 (six) hours as needed for muscle spasms.        Allergies:  Allergies  Allergen Reactions   Penicillins Rash    Past Medical History, Surgical history, Social history, and Family History were reviewed and updated.  Review of Systems: All other 10 point review of systems is negative.   Physical Exam:  vitals were not taken for this visit.   Wt Readings from Last 3 Encounters:  09/07/20 233 lb 12.8 oz (106.1 kg)  03/26/20 235 lb (106.6 kg)  01/31/20 230 lb (104.3 kg)    Ocular: Sclerae unicteric, pupils equal, round and reactive to light Ear-nose-throat: Oropharynx clear, dentition fair Lymphatic: No cervical, supraclavicular or axillary adenopathy Lungs no rales or rhonchi, good excursion bilaterally Heart regular rate and rhythm, no murmur appreciated Abd soft, nontender, positive bowel sounds MSK no focal spinal tenderness, no joint edema Neuro: non-focal, well-oriented, appropriate affect Breasts: Bilateral mastectomies with reconstruction unchanged. No mass, lesion or rash noted.    Lab Results  Component Value Date   WBC 5.4 03/12/2021   HGB 14.1 03/12/2021   HCT 42.1 03/12/2021   MCV 88.1 03/12/2021   PLT 292 03/12/2021   No results found for: FERRITIN, IRON, TIBC,  UIBC, IRONPCTSAT Lab Results  Component Value Date   RBC 4.78 03/12/2021   No results found for: KPAFRELGTCHN, LAMBDASER, KAPLAMBRATIO No results found for: Kandis Cocking, IGMSERUM No results found for: Odetta Pink, SPEI   Chemistry      Component Value Date/Time   NA 141 09/07/2020 1424   NA 139 01/29/2017 0747   NA 138 05/30/2016 0746   K 4.3 09/07/2020 1424   K 3.8 01/29/2017 0747   K 3.8 05/30/2016 0746   CL 105 09/07/2020 1424   CL 105 01/29/2017 0747   CO2 30 09/07/2020 1424   CO2 30 01/29/2017 0747   CO2  25 05/30/2016 0746   BUN 10 09/07/2020 1424   BUN 7 01/29/2017 0747   BUN 10.0 05/30/2016 0746   CREATININE 0.81 09/07/2020 1424   CREATININE 0.8 01/29/2017 0747   CREATININE 0.7 05/30/2016 0746      Component Value Date/Time   CALCIUM 10.3 09/07/2020 1424   CALCIUM 9.3 01/29/2017 0747   CALCIUM 9.2 05/30/2016 0746   ALKPHOS 70 09/07/2020 1424   ALKPHOS 70 01/29/2017 0747   ALKPHOS 71 05/30/2016 0746   AST 18 09/07/2020 1424   AST 16 05/30/2016 0746   ALT 19 09/07/2020 1424   ALT 18 01/29/2017 0747   ALT 11 05/30/2016 0746   BILITOT 0.4 09/07/2020 1424   BILITOT 0.83 05/30/2016 0746       Impression and Plan: [name] is a very pleasant 55 yo caucasian female with multifocal ductal carcinoma in situ of the left breast. She had bilateral mastectomies in February 2018.  So far, she continues to do well and there has been no evidence of recurrence.  Follow-up in another 6 months.  She can contact our office with any questions or concerns.   Laverna Peace, NP 8/16/20222:17 PM

## 2021-03-21 NOTE — Patient Instructions (Signed)
Below is our plan:  We will continue Ajovy monthly and rizatriptan and tizanidine as needed.   Please make sure you are staying well hydrated. I recommend 50-60 ounces daily. Well balanced diet and regular exercise encouraged. Consistent sleep schedule with 6-8 hours recommended.   Please continue follow up with care team as directed.   Follow up with me in 1 year   You may receive a survey regarding today's visit. I encourage you to leave honest feed back as I do use this information to improve patient care. Thank you for seeing me today!

## 2021-03-21 NOTE — Progress Notes (Signed)
PATIENT: [name] DOB: Mar 23, 1966  REASON FOR VISIT: follow up HISTORY FROM: patient  Virtual Visit via Telephone Note  I connected with [name] on 03/26/21 at  8:30 AM EDT by telephone and verified that I am speaking with the correct person using two identifiers.   I discussed the limitations, risks, security and privacy concerns of performing an evaluation and management service by telephone and the availability of in person appointments. I also discussed with the patient that there may be a patient responsible charge related to this service. The patient expressed understanding and agreed to proceed.   History of Present Illness:  03/26/21 ALL: [name] is a 55 y.o. female here today for follow up for migraines. She was previous on Amovig but switched to Ajovy due to cost. She feels that she is doing fairly well. She does have a few breakthrough seizures toward the end of the month just before next dose. Rizatriptan seems to work well for abortive therapy. She took [place] once but did not repeat dose. She is not sure it worked. Insurance would not cover [place]. She rarely takes tizanidine. It makes her sleepy. She is having about 2-3 migraines a month. Stress is most common trigger.   History (copied from Dr Cathren Laine previous note)  Interval history March 25, 2020: Patient is here for follow-up of migraines, have not seen her in close to 18 months, MRI of the brain in October 2020 (personally reviewed images) was normal. Was on Aimovig, did great, the card expired. Last time she had the Aimovig was in July, it comes in waves, she wants to stay on the [employer]. Headaches were reduced 50%,  She tried Nurtec. She is concerned about vasoconstriction. 2 migraines per week.She was happy on the Aimovig.    Interval history 5-6 headache days a month. She had cataract surgery, she sometimes sees spirals in both eyes sometimes associated within 30 minutes of a headache  other times just the spirals. She smells cigarette smoke, can happen for days, it comes and goes for several years. She had side effects to sumatriptan injections makes her feel weired. The maxalt still works but she as to take 2 and sthe migraine rebounds that day.   HPI:  [name] is a 55 y.o. female here as requested by provider Debbrah Alar, NP for migraines. She has tried nortriptyline, maxalt, zofran, magnesium and riboflavin, celexa, tizanidine. At least 18 headache days a month, all are migrainous and moderately-severe to severe. Ongoing for over a year at this severity and frequency.Migraines can last 24-72 hours. +nausea, +vomiting, severe and pounding, +light and sound sensitivity, starts on the left, movement makes it worse. Started in 1994. Started worsening in 36s. Currently no medication overuse.    Observations/Objective:  Generalized: Well developed, in no acute distress  Mentation: Alert oriented to time, place, history taking. Follows all commands speech and language fluent   Assessment and Plan:  55 y.o. year old female  has a past medical history of Abrasion of right arm (99991111), Complication of anesthesia, Detached retina, right (08/25/2018), History of breast cancer (2014), Hypothyroidism, Migraines, and PONV (postoperative nausea and vomiting). here with    ICD-10-CM   1. Chronic migraine without aura, with intractable migraine, so stated, with status migrainosus  G43.711 Fremanezumab-vfrm (AJOVY) 225 MG/1.5ML SOAJ      Chanti is doing well on Ajovy injections. Rizatriptan works well for abortive therapy. We will continue current treatment plan. She may use tizanidine  $'4mg'A$  as needed for intractable migraines. She will continue to focus on healthy lifestyle habits. Stress management encouraged. She will return for follow up in 1 year, sooner if needed.   No orders of the defined types were placed in this encounter.   Meds ordered this encounter   Medications   Fremanezumab-vfrm (AJOVY) 225 MG/1.5ML SOAJ    Sig: Inject 225 mg into the skin every 30 (thirty) days.    Dispense:  4.5 mL    Refill:  3    Order Specific Question:   Supervising Provider    Answer:   Melvenia Beam [id number]   rizatriptan (MAXALT-MLT) 10 MG disintegrating tablet    Sig: Take 1 tab at onset of migraine.  May repeat in 2 hrs, if needed.  Max dose: 2 tabs/day. This is a 30 day prescription.    Dispense:  9 tablet    Refill:  11    Dispense 9 or maximum allowed by insurance    Order Specific Question:   Supervising Provider    Answer:   Melvenia Beam [id number]     Follow Up Instructions:  I discussed the assessment and treatment plan with the patient. The patient was provided an opportunity to ask questions and all were answered. The patient agreed with the plan and demonstrated an understanding of the instructions.   The patient was advised to call back or seek an in-person evaluation if the symptoms worsen or if the condition fails to improve as anticipated.  I provided 18 minutes of non-face-to-face time during this encounter. Patient located at their place of residence during [place] visit. Provider is in the office.    Debbora Presto, NP

## 2021-03-26 ENCOUNTER — Telehealth (INDEPENDENT_AMBULATORY_CARE_PROVIDER_SITE_OTHER): Payer: Federal, State, Local not specified - PPO | Admitting: Family Medicine

## 2021-03-26 ENCOUNTER — Encounter: Payer: Self-pay | Admitting: Family Medicine

## 2021-03-26 DIAGNOSIS — G43711 Chronic migraine without aura, intractable, with status migrainosus: Secondary | ICD-10-CM

## 2021-03-26 MED ORDER — AJOVY 225 MG/1.5ML ~~LOC~~ SOAJ: 225.0000 mg | SUBCUTANEOUS | 3 refills | Status: DC

## 2021-03-26 MED ORDER — RIZATRIPTAN BENZOATE 10 MG PO TBDP: ORAL_TABLET | ORAL | 11 refills | Status: DC

## 2021-06-11 ENCOUNTER — Encounter: Payer: Self-pay | Admitting: Family Medicine

## 2021-06-12 NOTE — Telephone Encounter (Signed)
PA submitted over the over the phone through [facility] [employer]. [phone] Approved for 12 months PA- [id number]

## 2021-09-10 ENCOUNTER — Inpatient Hospital Stay: Payer: BC Managed Care – PPO

## 2021-09-13 ENCOUNTER — Ambulatory Visit: Payer: BC Managed Care – PPO | Admitting: Family

## 2021-09-13 ENCOUNTER — Other Ambulatory Visit: Payer: BC Managed Care – PPO

## 2021-09-16 DIAGNOSIS — S0501XA Injury of conjunctiva and corneal abrasion without foreign body, right eye, initial encounter: Secondary | ICD-10-CM | POA: Diagnosis not present

## 2021-09-23 DIAGNOSIS — E039 Hypothyroidism, unspecified: Secondary | ICD-10-CM | POA: Diagnosis not present

## 2021-09-25 ENCOUNTER — Other Ambulatory Visit: Payer: Self-pay

## 2021-09-25 ENCOUNTER — Inpatient Hospital Stay: Payer: BC Managed Care – PPO | Attending: Family

## 2021-09-25 ENCOUNTER — Encounter: Payer: Self-pay | Admitting: Family

## 2021-09-25 ENCOUNTER — Inpatient Hospital Stay (HOSPITAL_BASED_OUTPATIENT_CLINIC_OR_DEPARTMENT_OTHER): Payer: BC Managed Care – PPO | Admitting: Family

## 2021-09-25 VITALS — BP 112/75 | HR 83 | Temp 98.6°F | Resp 17 | Ht 67.0 in | Wt 227.0 lb

## 2021-09-25 DIAGNOSIS — D0512 Intraductal carcinoma in situ of left breast: Secondary | ICD-10-CM | POA: Insufficient documentation

## 2021-09-25 DIAGNOSIS — Z9013 Acquired absence of bilateral breasts and nipples: Secondary | ICD-10-CM | POA: Insufficient documentation

## 2021-09-25 DIAGNOSIS — N6322 Unspecified lump in the left breast, upper inner quadrant: Secondary | ICD-10-CM

## 2021-09-25 DIAGNOSIS — Z170 Estrogen receptor positive status [ER+]: Secondary | ICD-10-CM | POA: Diagnosis not present

## 2021-09-25 LAB — CMP (CANCER CENTER ONLY)
ALT: 39 U/L (ref 0–44)
AST: 28 U/L (ref 15–41)
Albumin: 4 g/dL (ref 3.5–5.0)
Alkaline Phosphatase: 67 U/L (ref 38–126)
Anion gap: 8 (ref 5–15)
BUN: 9 mg/dL (ref 6–20)
CO2: 28 mmol/L (ref 22–32)
Calcium: 9.3 mg/dL (ref 8.9–10.3)
Chloride: 102 mmol/L (ref 98–111)
Creatinine: 0.68 mg/dL (ref 0.44–1.00)
GFR, Estimated: >60 mL/min (ref >60)
Glucose, Bld: 84 mg/dL (ref 70–99)
Potassium: 4.1 mmol/L (ref 3.5–5.1)
Sodium: 138 mmol/L (ref 135–145)
Total Bilirubin: 0.5 mg/dL (ref 0.3–1.2)
Total Protein: 6.8 g/dL (ref 6.5–8.1)

## 2021-09-25 LAB — CBC WITH DIFFERENTIAL (CANCER CENTER ONLY)
Abs Immature Granulocytes: 0.01 10*3/uL (ref 0.00–0.07)
Basophils Absolute: 0.1 10*3/uL (ref 0.0–0.1)
Basophils Relative: 1 %
Eosinophils Absolute: 0.1 10*3/uL (ref 0.0–0.5)
Eosinophils Relative: 2 %
HCT: 42 % (ref 36.0–46.0)
Hemoglobin: 14.1 g/dL (ref 12.0–15.0)
Immature Granulocytes: 0 %
Lymphocytes Relative: 42 %
Lymphs Abs: 2.3 10*3/uL (ref 0.7–4.0)
MCH: 29.7 pg (ref 26.0–34.0)
MCHC: 33.6 g/dL (ref 30.0–36.0)
MCV: 88.4 fL (ref 80.0–100.0)
Monocytes Absolute: 0.5 10*3/uL (ref 0.1–1.0)
Monocytes Relative: 9 %
Neutro Abs: 2.5 10*3/uL (ref 1.7–7.7)
Neutrophils Relative %: 46 %
Platelet Count: 305 10*3/uL (ref 150–400)
RBC: 4.75 MIL/uL (ref 3.87–5.11)
RDW: 12.4 % (ref 11.5–15.5)
WBC Count: 5.5 10*3/uL (ref 4.0–10.5)
nRBC: 0 % (ref 0.0–0.2)

## 2021-09-25 NOTE — Progress Notes (Addendum)
?Hematology and Oncology Follow Up Visit ? ?[name] ?[id number] ?01-Feb-1966 56 y.o. ?09/25/2021 ? ? ?Principle Diagnosis:  ?Ductal carcinoma in situ of the left breast - multifocal ?  ?Current Therapy:        ?Bilateral mastectomies - 09/08/2016 ?  ?Interim History:  [name] is here today for follow-up. She noted a small palpable mass at the 10-11 o'clock position 2 weeks ago and states that she has aching at the site. This was palpated on breast exam today. No other abnormality noted.  ?No adenopathy or lymphedema noted.  ?No redness, edema or rash noted.  ?No fever, chills, n/v, cough, rash, dizziness, SOB, chest pain, palpitations, abdominal pain or changes in bowel or bladder habits.  ?No blood loss noted. No bruising, no petechiae.  ?No swelling, tenderness, numbness or tingling in her extremities.   ?No falls or syncope.  ?She has a good appetite and is staying well hydrated. Her weight is stable at 227 lbs.  ? ?ECOG Performance Status: 0 - Asymptomatic ? ?Medications:  ?Allergies as of 09/25/2021   ? ?   Reactions  ? Penicillins Rash  ? ?  ? ?  ?Medication List  ?  ? ?  ? Accurate as of September 25, 2021  8:29 AM. If you have any questions, ask your nurse or doctor.  ?  ?  ? ?  ? ?Ajovy 225 MG/1.5ML Soaj ?Generic drug: Fremanezumab-vfrm ?Inject 225 mg into the skin every 30 (thirty) days. ?  ?levothyroxine 125 MCG tablet ?Commonly known as: SYNTHROID ?Take 125 mcg by mouth daily before breakfast. ?  ?magnesium oxide 400 MG tablet ?Commonly known as: MAG-OX ?Take 400 mg by mouth daily. ?  ?Melatonin 10 MG Tabs ?Take 60 mg by mouth at bedtime. ?  ?MELATONIN PO ?Take 30 mg by mouth at bedtime. ?  ?multivitamin tablet ?Take 1 tablet by mouth daily. ?  ?ondansetron 4 MG disintegrating tablet ?Commonly known as: ZOFRAN-ODT ?Take 1 tablet (4 mg total) by mouth every 8 (eight) hours as needed for nausea. ?  ?rizatriptan 10 MG disintegrating tablet ?Commonly known as: MAXALT-MLT ?Take 1 tab at onset of migraine.   May repeat in 2 hrs, if needed.  Max dose: 2 tabs/day. This is a 30 day prescription. ?  ?tiZANidine 4 MG tablet ?Commonly known as: Zanaflex ?Take 1 tablet (4 mg total) by mouth every 6 (six) hours as needed for muscle spasms. ?  ? ?  ? ? ?Allergies:  ?Allergies  ?Allergen Reactions  ? Penicillins Rash  ? ? ?Past Medical History, Surgical history, Social history, and Family History were reviewed and updated. ? ?Review of Systems: ?All other 10 point review of systems is negative.  ? ?Physical Exam: ? vitals were not taken for this visit.  ? ?Wt Readings from Last 3 Encounters:  ?03/12/21 228 lb 1.9 oz (103.5 kg)  ?09/07/20 233 lb 12.8 oz (106.1 kg)  ?03/26/20 235 lb (106.6 kg)  ? ? ?Ocular: Sclerae unicteric, pupils equal, round and reactive to light ?Ear-nose-throat: Oropharynx clear, dentition fair ?Lymphatic: No cervical or supraclavicular adenopathy ?Lungs no rales or rhonchi, good excursion bilaterally ?Heart regular rate and rhythm, no murmur appreciated ?Abd soft, nontender, positive bowel sounds ?MSK no focal spinal tenderness, no joint edema ?Neuro: non-focal, well-oriented, appropriate affect ?Breasts: Same as noted above.  ? ?Lab Results  ?Component Value Date  ? WBC 5.5 09/25/2021  ? HGB 14.1 09/25/2021  ? HCT 42.0 09/25/2021  ? MCV 88.4 09/25/2021  ? PLT  305 09/25/2021  ? ?No results found for: FERRITIN, IRON, TIBC, UIBC, IRONPCTSAT ?Lab Results  ?Component Value Date  ? RBC 4.75 09/25/2021  ? ?No results found for: KPAFRELGTCHN, LAMBDASER, KAPLAMBRATIO ?No results found for: IGGSERUM, IGA, IGMSERUM ?No results found for: TOTALPROTELP, ALBUMINELP, A1GS, A2GS, BETS, BETA2SER, GAMS, MSPIKE, SPEI ?  Chemistry   ?   ?Component Value Date/Time  ? NA 138 03/12/2021 1352  ? NA 139 01/29/2017 0747  ? NA 138 05/30/2016 0746  ? K 4.2 03/12/2021 1352  ? K 3.8 01/29/2017 0747  ? K 3.8 05/30/2016 0746  ? CL 103 03/12/2021 1352  ? CL 105 01/29/2017 0747  ? CO2 29 03/12/2021 1352  ? CO2 30 01/29/2017 0747  ? CO2 25  05/30/2016 0746  ? BUN 12 03/12/2021 1352  ? BUN 7 01/29/2017 0747  ? BUN 10.0 05/30/2016 0746  ? CREATININE 0.69 03/12/2021 1352  ? CREATININE 0.8 01/29/2017 0747  ? CREATININE 0.7 05/30/2016 0746  ?    ?Component Value Date/Time  ? CALCIUM 9.8 03/12/2021 1352  ? CALCIUM 9.3 01/29/2017 0747  ? CALCIUM 9.2 05/30/2016 0746  ? ALKPHOS 67 03/12/2021 1352  ? ALKPHOS 70 01/29/2017 0747  ? ALKPHOS 71 05/30/2016 0746  ? AST 20 03/12/2021 1352  ? AST 16 05/30/2016 0746  ? ALT 22 03/12/2021 1352  ? ALT 18 01/29/2017 0747  ? ALT 11 05/30/2016 0746  ? BILITOT 0.4 03/12/2021 1352  ? BILITOT 0.83 05/30/2016 0746  ?  ? ? ? ?Impression and Plan: [name] is a very pleasant 56 yo caucasian female with multifocal ductal carcinoma in situ of the left breast. She had bilateral mastectomies in February 2018.  ?We will get an US of the left breast and axilla for better evaluation of mass.  ?Follow-up in 1 year. We can adjust pending [place] results.   ? ?[name], NP ?3/1/20238:29 AM ? ?

## 2021-10-11 DIAGNOSIS — E039 Hypothyroidism, unspecified: Secondary | ICD-10-CM | POA: Diagnosis not present

## 2021-10-15 ENCOUNTER — Ambulatory Visit
Admission: RE | Admit: 2021-10-15 | Discharge: 2021-10-15 | Disposition: A | Discharge status: Home / Self Care | Payer: BC Managed Care – PPO | Source: Ambulatory Visit | Attending: Family | Admitting: Family

## 2021-10-15 DIAGNOSIS — D0512 Intraductal carcinoma in situ of left breast: Secondary | ICD-10-CM

## 2021-10-15 DIAGNOSIS — N6322 Unspecified lump in the left breast, upper inner quadrant: Secondary | ICD-10-CM

## 2021-10-16 DIAGNOSIS — N368 Other specified disorders of urethra: Secondary | ICD-10-CM | POA: Diagnosis not present

## 2021-10-30 DIAGNOSIS — Z961 Presence of intraocular lens: Secondary | ICD-10-CM | POA: Diagnosis not present

## 2021-10-30 DIAGNOSIS — H2512 Age-related nuclear cataract, left eye: Secondary | ICD-10-CM | POA: Diagnosis not present

## 2021-10-30 DIAGNOSIS — H35373 Puckering of macula, bilateral: Secondary | ICD-10-CM | POA: Diagnosis not present

## 2021-10-30 DIAGNOSIS — H43822 Vitreomacular adhesion, left eye: Secondary | ICD-10-CM | POA: Diagnosis not present

## 2021-11-22 DIAGNOSIS — Z1211 Encounter for screening for malignant neoplasm of colon: Secondary | ICD-10-CM | POA: Diagnosis not present

## 2021-11-22 DIAGNOSIS — R197 Diarrhea, unspecified: Secondary | ICD-10-CM | POA: Diagnosis not present

## 2021-12-09 ENCOUNTER — Other Ambulatory Visit: Payer: Self-pay | Admitting: Neurology

## 2021-12-09 DIAGNOSIS — G43711 Chronic migraine without aura, intractable, with status migrainosus: Secondary | ICD-10-CM

## 2022-01-06 DIAGNOSIS — Z9011 Acquired absence of right breast and nipple: Secondary | ICD-10-CM | POA: Diagnosis not present

## 2022-01-06 DIAGNOSIS — C50912 Malignant neoplasm of unspecified site of left female breast: Secondary | ICD-10-CM | POA: Diagnosis not present

## 2022-03-06 DIAGNOSIS — D123 Benign neoplasm of transverse colon: Secondary | ICD-10-CM | POA: Diagnosis not present

## 2022-03-06 DIAGNOSIS — Z1211 Encounter for screening for malignant neoplasm of colon: Secondary | ICD-10-CM | POA: Diagnosis not present

## 2022-03-06 DIAGNOSIS — R197 Diarrhea, unspecified: Secondary | ICD-10-CM | POA: Diagnosis not present

## 2022-03-06 DIAGNOSIS — K648 Other hemorrhoids: Secondary | ICD-10-CM | POA: Diagnosis not present

## 2022-03-06 LAB — HM COLONOSCOPY

## 2022-03-20 DIAGNOSIS — H18522 Epithelial (juvenile) corneal dystrophy, left eye: Secondary | ICD-10-CM | POA: Diagnosis not present

## 2022-03-20 DIAGNOSIS — H524 Presbyopia: Secondary | ICD-10-CM | POA: Diagnosis not present

## 2022-03-20 DIAGNOSIS — H2512 Age-related nuclear cataract, left eye: Secondary | ICD-10-CM | POA: Diagnosis not present

## 2022-03-20 DIAGNOSIS — H52203 Unspecified astigmatism, bilateral: Secondary | ICD-10-CM | POA: Diagnosis not present

## 2022-03-20 DIAGNOSIS — H35371 Puckering of macula, right eye: Secondary | ICD-10-CM | POA: Diagnosis not present

## 2022-03-20 DIAGNOSIS — H25012 Cortical age-related cataract, left eye: Secondary | ICD-10-CM | POA: Diagnosis not present

## 2022-03-20 DIAGNOSIS — H5212 Myopia, left eye: Secondary | ICD-10-CM | POA: Diagnosis not present

## 2022-04-01 ENCOUNTER — Other Ambulatory Visit: Payer: Self-pay

## 2022-04-01 DIAGNOSIS — G43711 Chronic migraine without aura, intractable, with status migrainosus: Secondary | ICD-10-CM

## 2022-04-01 MED ORDER — AJOVY 225 MG/1.5ML ~~LOC~~ SOAJ: SUBCUTANEOUS | 3 refills | Status: DC

## 2022-04-01 NOTE — Telephone Encounter (Signed)
Refill for Ajovy 225 mg/1.30m has been sent pharmacy on file [facility] [address] [place]

## 2022-04-08 ENCOUNTER — Telehealth: Payer: Federal, State, Local not specified - PPO | Admitting: Family Medicine

## 2022-04-15 ENCOUNTER — Other Ambulatory Visit: Payer: Self-pay

## 2022-04-15 DIAGNOSIS — G43711 Chronic migraine without aura, intractable, with status migrainosus: Secondary | ICD-10-CM

## 2022-04-15 MED ORDER — AJOVY 225 MG/1.5ML ~~LOC~~ SOAJ: SUBCUTANEOUS | 3 refills | Status: DC

## 2022-04-15 NOTE — Patient Instructions (Signed)
Below is our plan:  We will continue Ajovy and rizatriptan   Please make sure you are staying well hydrated. I recommend 50-60 ounces daily. Well balanced diet and regular exercise encouraged. Consistent sleep schedule with 6-8 hours recommended.   Please continue follow up with care team as directed.   Follow up with me in 1 year   You may receive a survey regarding today's visit. I encourage you to leave honest feed back as I do use this information to improve patient care. Thank you for seeing me today!

## 2022-04-15 NOTE — Progress Notes (Unsigned)
PATIENT: [name] DOB: [birth year]  REASON FOR VISIT: follow up HISTORY FROM: patient  Virtual Visit via Telephone Note  I connected with [name] on 04/17/22 at  8:30 AM EDT by telephone and verified that I am speaking with the correct person using two identifiers.   I discussed the limitations, risks, security and privacy concerns of performing an evaluation and management service by telephone and the availability of in person appointments. I also discussed with the patient that there may be a patient responsible charge related to this service. The patient expressed understanding and agreed to proceed.   History of Present Illness:  04/17/22 ALL: [name] returns for follow up for migraines. She continues Ajovy and rizatriptan. She reports migraines are well managed. She may have 2-3 breakthrough headaches a month. Rizatriptan usually works well for abortive therapy. She may take tizanidine and or ondansetron as well. She is feeling well and without concerns.   03/26/2021 ALL:  [name] is a 56 y.o. female here today for follow up for migraines. She was previous on Amovig but switched to Ajovy due to cost. She feels that she is doing fairly well. She does have a few breakthrough seizures toward the end of the month just before next dose. Rizatriptan seems to work well for abortive therapy. She took [place] once but did not repeat dose. She is not sure it worked. Insurance would not cover [place]. She rarely takes tizanidine. It makes her sleepy. She is having about 2-3 migraines a month. Stress is most common trigger.   History (copied from Dr Cathren Laine previous note)  Interval history March 25, 2020: Patient is here for follow-up of migraines, have not seen her in close to 18 months, MRI of the brain in October 2020 (personally reviewed images) was normal. Was on Aimovig, did great, the card expired. Last time she had the Aimovig was in July, it comes in waves, she wants  to stay on the [place]. Headaches were reduced 50%,  She tried Nurtec. She is concerned about vasoconstriction. 2 migraines per week.She was happy on the Aimovig.    Interval history 5-6 headache days a month. She had cataract surgery, she sometimes sees spirals in both eyes sometimes associated within 30 minutes of a headache other times just the spirals. She smells cigarette smoke, can happen for days, it comes and goes for several years. She had side effects to sumatriptan injections makes her feel weired. The maxalt still works but she as to take 2 and sthe migraine rebounds that day.   HPI:  [name] is a 56 y.o. female here as requested by provider Stephanie Alar, NP for migraines. She has tried nortriptyline, maxalt, zofran, magnesium and riboflavin, celexa, tizanidine. At least 18 headache days a month, all are migrainous and moderately-severe to severe. Ongoing for over a year at this severity and frequency.Migraines can last 24-72 hours. +nausea, +vomiting, severe and pounding, +light and sound sensitivity, starts on the left, movement makes it worse. Started in 1994. Started worsening in 40s. Currently no medication overuse.    Observations/Objective:  Generalized: Well developed, in no acute distress  Mentation: Alert oriented to time, place, history taking. Follows all commands speech and language fluent   Assessment and Plan:  56 y.o. year old female  has a past medical history of Abrasion of right arm (16/04/9603), Complication of anesthesia, Detached retina, right (08/25/2018), History of breast cancer (2014), Hypothyroidism, Migraines, and PONV (postoperative nausea and vomiting). here with  ICD-10-CM   1. Chronic migraine without aura, with intractable migraine, so stated, with status migrainosus  G43.711 Fremanezumab-vfrm (AJOVY) 225 MG/1.5ML SOAJ       [name] is doing well on Ajovy injections. Rizatriptan works well for abortive therapy. We will continue current  treatment plan. She may use tizanidine '4mg'$  as needed for intractable migraines and ondansetron for nausea. She will continue to focus on healthy lifestyle habits. Stress management encouraged. She will return for follow up in 1 year, sooner if needed.   No orders of the defined types were placed in this encounter.    Meds ordered this encounter  Medications   Fremanezumab-vfrm (AJOVY) 225 MG/1.5ML SOAJ    Sig: INJECT 225 MG UNDER THE SKIN EVERY 30 DAYS    Dispense:  4.5 mL    Refill:  3    Order Specific Question:   Supervising Provider    Answer:   Melvenia Beam [[id number]]   rizatriptan (MAXALT-MLT) 10 MG disintegrating tablet    Sig: Take 1 tab at onset of migraine.  May repeat in 2 hrs, if needed.  Max dose: 2 tabs/day. This is a 30 day prescription.    Dispense:  9 tablet    Refill:  11    Dispense 9 or maximum allowed by insurance    Order Specific Question:   Supervising Provider    Answer:   Melvenia Beam [[id number]]   ondansetron (ZOFRAN-ODT) 4 MG disintegrating tablet    Sig: Take 1 tablet (4 mg total) by mouth every 8 (eight) hours as needed for nausea.    Dispense:  20 tablet    Refill:  1    Order Specific Question:   Supervising Provider    Answer:   Melvenia Beam [id number]     Follow Up Instructions:  I discussed the assessment and treatment plan with the patient. The patient was provided an opportunity to ask questions and all were answered. The patient agreed with the plan and demonstrated an understanding of the instructions.   The patient was advised to call back or seek an in-person evaluation if the symptoms worsen or if the condition fails to improve as anticipated.  I provided 15 minutes of non-face-to-face time during this encounter. Patient located at their place of residence during [place] visit. Provider is in the office.    Stephanie Presto, NP

## 2022-04-17 ENCOUNTER — Encounter: Payer: Self-pay | Admitting: Family Medicine

## 2022-04-17 ENCOUNTER — Telehealth (INDEPENDENT_AMBULATORY_CARE_PROVIDER_SITE_OTHER): Payer: BC Managed Care – PPO | Admitting: Family Medicine

## 2022-04-17 DIAGNOSIS — G43711 Chronic migraine without aura, intractable, with status migrainosus: Secondary | ICD-10-CM | POA: Diagnosis not present

## 2022-04-17 MED ORDER — RIZATRIPTAN BENZOATE 10 MG PO TBDP: ORAL_TABLET | ORAL | 11 refills | Status: DC

## 2022-04-17 MED ORDER — AJOVY 225 MG/1.5ML ~~LOC~~ SOAJ: SUBCUTANEOUS | 3 refills | Status: DC

## 2022-04-17 MED ORDER — ONDANSETRON 4 MG PO TBDP: 4.0000 mg | ORAL_TABLET | Freq: Three times a day (TID) | ORAL | 1 refills | Status: DC | PRN

## 2022-07-10 ENCOUNTER — Encounter: Payer: Self-pay | Admitting: *Deleted

## 2022-07-14 NOTE — Telephone Encounter (Signed)
PA Submitted on covermymeds. Key: [id number]. Waiting on determination from [place].

## 2022-07-14 NOTE — Telephone Encounter (Signed)
Received fax from [place] that [place] approved 07/14/22-07/15/23. PA# [place] [id number] Non-grandfathered [id number]

## 2022-09-01 DIAGNOSIS — U071 COVID-19: Secondary | ICD-10-CM | POA: Diagnosis not present

## 2022-09-18 DIAGNOSIS — K8689 Other specified diseases of pancreas: Secondary | ICD-10-CM | POA: Diagnosis not present

## 2022-09-18 DIAGNOSIS — R197 Diarrhea, unspecified: Secondary | ICD-10-CM | POA: Diagnosis not present

## 2022-09-18 DIAGNOSIS — K851 Biliary acute pancreatitis without necrosis or infection: Secondary | ICD-10-CM | POA: Diagnosis not present

## 2022-09-26 ENCOUNTER — Encounter: Payer: Self-pay | Admitting: Family

## 2022-09-26 ENCOUNTER — Inpatient Hospital Stay (HOSPITAL_BASED_OUTPATIENT_CLINIC_OR_DEPARTMENT_OTHER): Payer: BC Managed Care – PPO | Admitting: Family

## 2022-09-26 ENCOUNTER — Other Ambulatory Visit: Payer: Self-pay | Admitting: *Deleted

## 2022-09-26 ENCOUNTER — Inpatient Hospital Stay: Payer: BC Managed Care – PPO | Attending: Family

## 2022-09-26 VITALS — BP 122/63 | HR 70 | Temp 98.4°F | Resp 17 | Wt 223.0 lb

## 2022-09-26 DIAGNOSIS — N6322 Unspecified lump in the left breast, upper inner quadrant: Secondary | ICD-10-CM

## 2022-09-26 DIAGNOSIS — R5383 Other fatigue: Secondary | ICD-10-CM | POA: Insufficient documentation

## 2022-09-26 DIAGNOSIS — D0512 Intraductal carcinoma in situ of left breast: Secondary | ICD-10-CM | POA: Insufficient documentation

## 2022-09-26 DIAGNOSIS — R197 Diarrhea, unspecified: Secondary | ICD-10-CM | POA: Diagnosis not present

## 2022-09-26 DIAGNOSIS — R110 Nausea: Secondary | ICD-10-CM | POA: Insufficient documentation

## 2022-09-26 DIAGNOSIS — Z79899 Other long term (current) drug therapy: Secondary | ICD-10-CM | POA: Diagnosis not present

## 2022-09-26 DIAGNOSIS — Z9013 Acquired absence of bilateral breasts and nipples: Secondary | ICD-10-CM | POA: Diagnosis not present

## 2022-09-26 LAB — CMP (CANCER CENTER ONLY)
ALT: 15 U/L (ref 0–44)
AST: 14 U/L — ABNORMAL LOW (ref 15–41)
Albumin: 4.6 g/dL (ref 3.5–5.0)
Alkaline Phosphatase: 65 U/L (ref 38–126)
Anion gap: 7 (ref 5–15)
BUN: 9 mg/dL (ref 6–20)
CO2: 29 mmol/L (ref 22–32)
Calcium: 9.9 mg/dL (ref 8.9–10.3)
Chloride: 101 mmol/L (ref 98–111)
Creatinine: 0.74 mg/dL (ref 0.44–1.00)
GFR, Estimated: >60 mL/min (ref >60)
Glucose, Bld: 106 mg/dL — ABNORMAL HIGH (ref 70–99)
Potassium: 4.8 mmol/L (ref 3.5–5.1)
Sodium: 137 mmol/L (ref 135–145)
Total Bilirubin: 0.5 mg/dL (ref 0.3–1.2)
Total Protein: 7.1 g/dL (ref 6.5–8.1)

## 2022-09-26 LAB — CBC WITH DIFFERENTIAL (CANCER CENTER ONLY)
Abs Immature Granulocytes: 0 10*3/uL (ref 0.00–0.07)
Basophils Absolute: 0 10*3/uL (ref 0.0–0.1)
Basophils Relative: 1 %
Eosinophils Absolute: 0.1 10*3/uL (ref 0.0–0.5)
Eosinophils Relative: 1 %
HCT: 40.4 % (ref 36.0–46.0)
Hemoglobin: 13.6 g/dL (ref 12.0–15.0)
Immature Granulocytes: 0 %
Lymphocytes Relative: 51 %
Lymphs Abs: 2.3 10*3/uL (ref 0.7–4.0)
MCH: 30.1 pg (ref 26.0–34.0)
MCHC: 33.7 g/dL (ref 30.0–36.0)
MCV: 89.4 fL (ref 80.0–100.0)
Monocytes Absolute: 0.5 10*3/uL (ref 0.1–1.0)
Monocytes Relative: 10 %
Neutro Abs: 1.6 10*3/uL — ABNORMAL LOW (ref 1.7–7.7)
Neutrophils Relative %: 37 %
Platelet Count: 263 10*3/uL (ref 150–400)
RBC: 4.52 MIL/uL (ref 3.87–5.11)
RDW: 12.6 % (ref 11.5–15.5)
WBC Count: 4.5 10*3/uL (ref 4.0–10.5)
nRBC: 0 % (ref 0.0–0.2)

## 2022-09-26 NOTE — Progress Notes (Signed)
Hematology and Oncology Follow Up Visit  [name] [id number] 1966/05/27 57 y.o. 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 She is doing fairly well. She has been having issues with her gall bladder and pancrease and has been placed on pancreatic enzymes to help with the diarrhea and nausea (no vomiting). She has a follow-up with GI in April but is hoping to be seen sooner.  She notes fatigue.  She also has a prolapsed urethra and urinary urgency. She is on observation right now.  She has bright red blood with hemorrhoids occasionally. No other blood loss noted.  No bruising or petechiae.  Bilateral breast exam is negative. No mass, lesion or rash noted.  No adenopathy or lymphedema.  She has occasional episodes of palpitations.  No fever, chills, cough, rash, dizziness, SOB, chest pain or changes in bladder habits.  No swelling, tenderness, numbness or tingling in her extremities.  No falls or syncope reported.  Appetite and hydration are good. Weight is stable at 223 lbs.   ECOG Performance Status: 1 - Symptomatic but completely ambulatory  Medications:  Allergies as of 09/26/2022       Reactions   Penicillins Rash        Medication List        Accurate as of September 26, 2022  8:47 AM. If you have any questions, ask your nurse or doctor.          Ajovy 225 MG/1.5ML Soaj Generic drug: Fremanezumab-vfrm INJECT 225 MG UNDER THE SKIN EVERY 30 DAYS   Creon 12000-38000 units Cpep capsule Generic drug: lipase/protease/amylase Take 1 capsule by mouth 3 (three) times daily. + 36,000 units with 1 snack   levothyroxine 125 MCG tablet Commonly known as: SYNTHROID Take 100 mcg by mouth daily before breakfast.   Melatonin 10 MG Tabs Take 60 mg by mouth at bedtime.   MELATONIN PO Take 30 mg  by mouth at bedtime.   omeprazole 20 MG capsule Commonly known as: PRILOSEC Take 20 mg by mouth daily. At night   ondansetron 4 MG disintegrating tablet Commonly known as: ZOFRAN-ODT Take 1 tablet (4 mg total) by mouth every 8 (eight) hours as needed for nausea.   rizatriptan 10 MG disintegrating tablet Commonly known as: MAXALT-MLT Take 1 tab at onset of migraine.  May repeat in 2 hrs, if needed.  Max dose: 2 tabs/day. This is a 30 day prescription.   tiZANidine 4 MG tablet Commonly known as: Zanaflex Take 1 tablet (4 mg total) by mouth every 6 (six) hours as needed for muscle spasms.        Allergies:  Allergies  Allergen Reactions   Penicillins Rash    Past Medical History, Surgical history, Social history, and Family History were reviewed and updated.  Review of Systems: All other 10 point review of systems is negative.   Physical Exam:  weight is 223 lb (101.2 kg). Her oral temperature is 98.4 F (36.9 C). Her blood pressure is 122/63 and her pulse is 70. Her respiration is 17 and oxygen saturation is 100%.   Wt Readings from Last 3 Encounters:  09/26/22 223 lb (101.2 kg)  09/25/21 227 lb (103 kg)  03/12/21 228 lb 1.9 oz (103.5 kg)    Ocular: Sclerae unicteric, pupils equal, round and  reactive to light Ear-nose-throat: Oropharynx clear, dentition fair Lymphatic: No cervical or supraclavicular adenopathy Lungs no rales or rhonchi, good excursion bilaterally Heart regular rate and rhythm, no murmur appreciated Abd soft, nontender, positive bowel sounds MSK no focal spinal tenderness, no joint edema Neuro: non-focal, well-oriented, appropriate affect Breasts: Deferred   Lab Results  Component Value Date   WBC 4.5 09/26/2022   HGB 13.6 09/26/2022   HCT 40.4 09/26/2022   MCV 89.4 09/26/2022   PLT 263 09/26/2022   No results found for: "FERRITIN", "IRON", "TIBC", "UIBC", "IRONPCTSAT" Lab Results  Component Value Date   RBC 4.52 09/26/2022   No results  found for: "KPAFRELGTCHN", "LAMBDASER", "KAPLAMBRATIO" No results found for: "IGGSERUM", "IGA", "IGMSERUM" No results found for: "TOTALPROTELP", "ALBUMINELP", "A1GS", "A2GS", "BETS", "BETA2SER", "GAMS", "MSPIKE", "SPEI"   Chemistry      Component Value Date/Time   NA 138 09/25/2021 0810   NA 139 01/29/2017 0747   NA 138 05/30/2016 0746   K 4.1 09/25/2021 0810   K 3.8 01/29/2017 0747   K 3.8 05/30/2016 0746   CL 102 09/25/2021 0810   CL 105 01/29/2017 0747   CO2 28 09/25/2021 0810   CO2 30 01/29/2017 0747   CO2 25 05/30/2016 0746   BUN 9 09/25/2021 0810   BUN 7 01/29/2017 0747   BUN 10.0 05/30/2016 0746   CREATININE 0.68 09/25/2021 0810   CREATININE 0.8 01/29/2017 0747   CREATININE 0.7 05/30/2016 0746      Component Value Date/Time   CALCIUM 9.3 09/25/2021 0810   CALCIUM 9.3 01/29/2017 0747   CALCIUM 9.2 05/30/2016 0746   ALKPHOS 67 09/25/2021 0810   ALKPHOS 70 01/29/2017 0747   ALKPHOS 71 05/30/2016 0746   AST 28 09/25/2021 0810   AST 16 05/30/2016 0746   ALT 39 09/25/2021 0810   ALT 18 01/29/2017 0747   ALT 11 05/30/2016 0746   BILITOT 0.5 09/25/2021 0810   BILITOT 0.83 05/30/2016 0746       Impression and Plan: [name] is a very pleasant 57 yo caucasian female with multifocal ductal carcinoma in situ of the left breast. Wilkins is a very pleasant 57 yo caucasian female with multifocal ductal carcinoma in situ of the left breast. She had bilateral mastectomies in February 2018.  From our stand point everything is stable. No s/s or recurrence at this time.  Follow-up in 1 year.   [name], NP 3/1/20248:47 AM

## 2022-10-16 DIAGNOSIS — Z6835 Body mass index (BMI) 35.0-35.9, adult: Secondary | ICD-10-CM | POA: Diagnosis not present

## 2022-10-16 DIAGNOSIS — Z01419 Encounter for gynecological examination (general) (routine) without abnormal findings: Secondary | ICD-10-CM | POA: Diagnosis not present

## 2022-11-04 DIAGNOSIS — R109 Unspecified abdominal pain: Secondary | ICD-10-CM | POA: Diagnosis not present

## 2022-11-04 DIAGNOSIS — K8689 Other specified diseases of pancreas: Secondary | ICD-10-CM | POA: Diagnosis not present

## 2022-11-04 DIAGNOSIS — K219 Gastro-esophageal reflux disease without esophagitis: Secondary | ICD-10-CM | POA: Diagnosis not present

## 2022-11-04 DIAGNOSIS — Z86010 Personal history of colonic polyps: Secondary | ICD-10-CM | POA: Diagnosis not present

## 2022-12-16 ENCOUNTER — Telehealth: Payer: Self-pay | Admitting: *Deleted

## 2022-12-16 NOTE — Telephone Encounter (Signed)
Initiated PA Ajovy on covermymeds. Key: [id number]. Waiting on questions to become available to proceed with PA.

## 2023-04-21 ENCOUNTER — Encounter: Payer: Self-pay | Admitting: Family Medicine

## 2023-04-21 ENCOUNTER — Telehealth (INDEPENDENT_AMBULATORY_CARE_PROVIDER_SITE_OTHER): Payer: BC Managed Care – PPO | Admitting: Family Medicine

## 2023-04-21 DIAGNOSIS — G43711 Chronic migraine without aura, intractable, with status migrainosus: Secondary | ICD-10-CM | POA: Diagnosis not present

## 2023-04-21 MED ORDER — RIZATRIPTAN BENZOATE 10 MG PO TBDP: ORAL_TABLET | ORAL | 11 refills | Status: DC

## 2023-04-21 MED ORDER — AJOVY 225 MG/1.5ML ~~LOC~~ SOAJ: SUBCUTANEOUS | 3 refills | Status: DC

## 2023-04-21 MED ORDER — ONDANSETRON 4 MG PO TBDP: 4.0000 mg | ORAL_TABLET | Freq: Three times a day (TID) | ORAL | 1 refills | Status: DC | PRN

## 2023-04-21 NOTE — Progress Notes (Signed)
PATIENT: [name] [name] DOB: 09/20/1965  REASON FOR VISIT: follow up HISTORY FROM: patient  Virtual Visit via Telephone Note  I connected with Shevonne Wolf [name] on 04/21/23 at  9:45 AM EDT by telephone and verified that I am speaking with the correct person using two identifiers.   I discussed the limitations, risks, security and privacy concerns of performing an evaluation and management service by telephone and the availability of in person appointments. I also discussed with the patient that there may be a patient responsible charge related to this service. The patient expressed understanding and agreed to proceed.   History of Present Illness:  04/21/23 ALL: [name] returns for follow up for migraines. She continues Ajovy and rizatriptan. She continues to do well. She may have 1 breakthrough migraine per month. Rizatriptan works well. Ondansetron helps with nausea.   04/17/2022 ALL: [name] returns for follow up for migraines. She continues Ajovy and rizatriptan. She reports migraines are well managed. She may have 2-3 breakthrough headaches a month. Rizatriptan usually works well for abortive therapy. She may take tizanidine and or ondansetron as well. She is feeling well and without concerns.   03/26/2021 ALL:  [name] [name] is a 57 y.o. female here today for follow up for migraines. She was previous on Amovig but switched to Ajovy due to cost. She feels that she is doing fairly well. She does have a few breakthrough seizures toward the end of the month just before next dose. Rizatriptan seems to work well for abortive therapy. She took [place] once but did not repeat dose. She is not sure it worked. Insurance would not cover [place]. She rarely takes tizanidine. It makes her sleepy. She is having about 2-3 migraines a month. Stress is most common trigger.   History (copied from Dr Trevor Mace previous note)  Interval history March 25, 2020: Patient is here for follow-up of migraines, have  not seen her in close to 18 months, MRI of the brain in October 2020 (personally reviewed images) was normal. Was on Aimovig, did great, the card expired. Last time she had the Aimovig was in July, it comes in waves, she wants to stay on the Aimovig. Headaches were reduced 50%,  She tried Nurtec. She is concerned about vasoconstriction. 2 migraines per week.She was happy on the Aimovig.    Interval history 5-6 headache days a month. She had cataract surgery, she sometimes sees spirals in both eyes sometimes associated within 30 minutes of a headache other times just the spirals. She smells cigarette smoke, can happen for days, it comes and goes for several years. She had side effects to sumatriptan injections makes her feel weired. The maxalt still works but she as to take 2 and sthe migraine rebounds that day.   HPI:  [name] is a 57 y.o. female here as requested by provider Sandford Craze, NP for migraines. She has tried nortriptyline, maxalt, zofran, magnesium and riboflavin, celexa, tizanidine. At least 18 headache days a month, all are migrainous and moderately-severe to severe. Ongoing for over a year at this severity and frequency.Migraines can last 24-72 hours. +nausea, +vomiting, severe and pounding, +light and sound sensitivity, starts on the left, movement makes it worse. Started in 1994. Started worsening in 30s. Currently no medication overuse.    Observations/Objective:  Generalized: Well developed, in no acute distress  Mentation: Alert oriented to time, place, history taking. Follows all commands speech and language fluent   Assessment and Plan:  57 y.o. year  old female  has a past medical history of Abrasion of right arm (12/25/2016), Complication of anesthesia, Detached retina, right (08/25/2018), History of breast cancer (2014), Hypothyroidism, Migraines, and PONV (postoperative nausea and vomiting). here with    ICD-10-CM   1. Chronic migraine without aura, with  intractable migraine, so stated, with status migrainosus  G43.711 Fremanezumab-vfrm (AJOVY) 225 MG/1.5ML SOAJ        [name] is doing well on Ajovy injections. Rizatriptan works well for abortive therapy. We will continue current treatment plan. She may use ondansetron for nausea. She will continue to focus on healthy lifestyle habits. Stress management encouraged. She will return for follow up in 1 year, sooner if needed.   No orders of the defined types were placed in this encounter.    Meds ordered this encounter  Medications   rizatriptan (MAXALT-MLT) 10 MG disintegrating tablet    Sig: Take 1 tab at onset of migraine.  May repeat in 2 hrs, if needed.  Max dose: 2 tabs/day. This is a 30 day prescription.    Dispense:  9 tablet    Refill:  11    Dispense 9 or maximum allowed by insurance    Order Specific Question:   Supervising Provider    Answer:   Anson Fret [[id number]]   ondansetron (ZOFRAN-ODT) 4 MG disintegrating tablet    Sig: Take 1 tablet (4 mg total) by mouth every 8 (eight) hours as needed for nausea.    Dispense:  20 tablet    Refill:  1    Order Specific Question:   Supervising Provider    Answer:   Anson Fret [[id number]]   Fremanezumab-vfrm (AJOVY) 225 MG/1.5ML SOAJ    Sig: INJECT 225 MG UNDER THE SKIN EVERY 30 DAYS    Dispense:  4.5 mL    Refill:  3    Order Specific Question:   Supervising Provider    Answer:   Anson Fret [[id number]]     Follow Up Instructions:  I discussed the assessment and treatment plan with the patient. The patient was provided an opportunity to ask questions and all were answered. The patient agreed with the plan and demonstrated an understanding of the instructions.   The patient was advised to call back or seek an in-person evaluation if the symptoms worsen or if the condition fails to improve as anticipated.  I provided 15 minutes of non-face-to-face time during this encounter. Patient located at their place of residence  during [place] visit. Provider is in the office.    Shawnie Dapper, NP

## 2023-04-21 NOTE — Patient Instructions (Signed)
Below is our plan:  We will continue Ajpvy and rizatriptan   Please make sure you are staying well hydrated. I recommend 50-60 ounces daily. Well balanced diet and regular exercise encouraged. Consistent sleep schedule with 6-8 hours recommended.   Please continue follow up with care team as directed.   Follow up with me in 1 year  You may receive a survey regarding today's visit. I encourage you to leave honest feed back as I do use this information to improve patient care. Thank you for seeing me today!   GENERAL HEADACHE INFORMATION:   Natural supplements: Magnesium Oxide or Magnesium Glycinate 500 mg at bed (up to 800 mg daily) Coenzyme Q10 300 mg in AM Vitamin B2- 200 mg twice a day   Add 1 supplement at a time since even natural supplements can have undesirable side effects. You can sometimes buy supplements cheaper (especially Coenzyme Q10) at www.WebmailGuide.co.za or at [facility].  Migraine with aura: There is increased risk for stroke in women with migraine with aura and a contraindication for the combined contraceptive pill for use by women who have migraine with aura. The risk for women with migraine without aura is lower. However other risk factors like smoking are far more likely to increase stroke risk than migraine. There is a recommendation for no smoking and for the use of OCPs without estrogen such as progestogen only pills particularly for women with migraine with aura.Marland Kitchen People who have migraine headaches with auras may be 3 times more likely to have a stroke caused by a blood clot, compared to migraine patients who don't see auras. Women who take hormone-replacement therapy may be 30 percent more likely to suffer a clot-based stroke than women not taking medication containing estrogen. Other risk factors like smoking and high blood pressure may be  much more important.    Vitamins and herbs that show potential:   Magnesium: Magnesium (250 mg twice a day or 500 mg at bed) has a  relaxant effect on smooth muscles such as blood vessels. Individuals suffering from frequent or daily headache usually have low magnesium levels which can be increase with daily supplementation of 400-750 mg. Three trials found 40-90% average headache reduction  when used as a preventative. Magnesium may help with headaches are aura, the best evidence for magnesium is for migraine with aura is its thought to stop the cortical spreading depression we believe is the pathophysiology of migraine aura.Magnesium also demonstrated the benefit in menstrually related migraine.  Magnesium is part of the messenger system in the serotonin cascade and it is a good muscle relaxant.  It is also useful for constipation which can be a side effect of other medications used to treat migraine. Good sources include nuts, whole grains, and tomatoes. Side Effects: loose stool/diarrhea  Riboflavin (vitamin B 2) 200 mg twice a day. This vitamin assists nerve cells in the production of ATP a principal energy storing molecule.  It is necessary for many chemical reactions in the body.  There have been at least 3 clinical trials of riboflavin using 400 mg per day all of which suggested that migraine frequency can be decreased.  All 3 trials showed significant improvement in over half of migraine sufferers.  The supplement is found in bread, cereal, milk, meat, and poultry.  Most Americans get more riboflavin than the recommended daily allowance, however riboflavin deficiency is not necessary for the supplements to help prevent headache. Side effects: energizing, green urine   Coenzyme Q10: This is present  in almost all cells in the body and is critical component for the conversion of energy.  Recent studies have shown that a nutritional supplement of CoQ10 can reduce the frequency of migraine attacks by improving the energy production of cells as with riboflavin.  Doses of 150 mg twice a day have been shown to be effective.   Melatonin:  Increasing evidence shows correlation between melatonin secretion and headache conditions.  Melatonin supplementation has decreased headache intensity and duration.  It is widely used as a sleep aid.  Sleep is natures way of dealing with migraine.  A dose of 3 mg is recommended to start for headaches including cluster headache. Higher doses up to 15 mg has been reviewed for use in Cluster headache and have been used. The rationale behind using melatonin for cluster is that many theories regarding the cause of Cluster headache center around the disruption of the normal circadian rhythm in the brain.  This helps restore the normal circadian rhythm.   HEADACHE DIET: Foods and beverages which may trigger migraine Note that only 20% of headache patients are food sensitive. You will know if you are food sensitive if you get a headache consistently 20 minutes to 2 hours after eating a certain food. Only cut out a food if it causes headaches, otherwise you might remove foods you enjoy! What matters most for diet is to eat a well balanced healthy diet full of vegetables and low fat protein, and to not miss meals.   Chocolate, other sweets ALL cheeses except cottage and cream cheese Dairy products, yogurt, sour cream, ice cream Liver Meat extracts (Bovril, Marmite, meat tenderizers) Meats or fish which have undergone aging, fermenting, pickling or smoking. These include: Hotdogs,salami,Lox,sausage, mortadellas,smoked salmon, pepperoni, Pickled herring Pods of broad bean (English beans, Chinese pea pods, [place] (fava) beans, lima and navy beans Ripe avocado, ripe banana Yeast extracts or active yeast preparations such as Brewer's or Fleishman's (commercial bakes goods are permitted) Tomato based foods, pizza (lasagna, etc.)   MSG (monosodium glutamate) is disguised as many things; look for these common aliases: Monopotassium glutamate Autolysed yeast Hydrolysed protein Sodium  caseinate "flavorings" "all natural preservatives" Nutrasweet   Avoid all other foods that convincingly provoke headaches.   Resources: The Dizzy [place]air Laundry Your Headache Diet, migrainestrong.com  https://zamora-andrews.com/   Caffeine and Migraine For patients that have migraine, caffeine intake more than 3 days per week can lead to dependency and increased migraine frequency. I would recommend cutting back on your caffeine intake as best you can. The recommended amount of caffeine is 200-300 mg daily, although migraine patients may experience dependency at even lower doses. While you may notice an increase in headache temporarily, cutting back will be helpful for headaches in the long run. For more information on caffeine and migraine, visit: https://americanmigrainefoundation.org/resource-library/caffeine-and-migraine/   Headache Prevention Strategies:   1. Maintain a headache diary; learn to identify and avoid triggers.  - This can be a simple note where you log when you had a headache, associated symptoms, and medications used - There are several smartphone apps developed to help track migraines: Migraine Buddy, Migraine Monitor, Curelator N1-Headache App   Common triggers include: Emotional triggers: Emotional/Upset family or friends Emotional/Upset occupation Business reversal/success Anticipation anxiety Crisis-serious Post-crisis periodNew job/position   Physical triggers: Vacation Day Weekend Strenuous Exercise High Altitude Location New Move Menstrual Day Physical Illness Oversleep/Not enough sleep Weather changes Light: Photophobia or light sesnitivity treatment involves a balance between desensitization and reduction in overly strong input. Use  dark polarized glasses outside, but not inside. Avoid bright or fluorescent light, but do not dim environment to the point that going into a normally lit room hurts. Consider  FL-41 tint lenses, which reduce the most irritating wavelengths without blocking too much light.  These can be obtained at axonoptics.com or theraspecs.com Foods: see list above.   2. Limit use of acute treatments (over-the-counter medications, triptans, etc.) to no more than 2 days per week or 10 days per month to prevent medication overuse headache (rebound headache).     3. Follow a regular schedule (including weekends and holidays): Don't skip meals. Eat a balanced diet. 8 hours of sleep nightly. Minimize stress. Exercise 30 minutes per day. Being overweight is associated with a 5 times increased risk of chronic migraine. Keep well hydrated and drink 6-8 glasses of water per day.   4. Initiate non-pharmacologic measures at the earliest onset of your headache. Rest and quiet environment. Relax and reduce stress. Breathe2Relax is a free app that can instruct you on    some simple relaxtion and breathing techniques. Http://Dawnbuse.com is a    free website that provides teaching videos on relaxation.  Also, there are  many apps that   can be downloaded for "mindful" relaxation.  An app called YOGA NIDRA will help walk you through mindfulness. Another app called Calm can be downloaded to give you a structured mindfulness guide with daily reminders and skill development. Headspace for guided meditation Mindfulness Based Stress Reduction Online Course: www.palousemindfulness.com Cold compresses.   5. Don't wait!! Take the maximum allowable dosage of prescribed medication at the first sign of migraine.   6. Compliance:  Take prescribed medication regularly as directed and at the first sign of a migraine.   7. Communicate:  Call your physician when problems arise, especially if your headaches change, increase in frequency/severity, or become associated with neurological symptoms (weakness, numbness, slurred speech, etc.). Proceed to emergency room if you experience new or worsening symptoms or  symptoms do not resolve, if you have new neurologic symptoms or if headache is severe, or for any concerning symptom.   8. Headache/pain management therapies: Consider various complementary methods, including medication, behavioral therapy, psychological counselling, biofeedback, massage therapy, acupuncture, dry needling, and other modalities.  Such measures may reduce the need for medications. Counseling for pain management, where patients learn to function and ignore/minimize their pain, seems to work very well.   9. Recommend changing family's attention and focus away from patient's headaches. Instead, emphasize daily activities. If first question of day is 'How are your headaches/Do you have a headache today?', then patient will constantly think about headaches, thus making them worse. Goal is to re-direct attention away from headaches, toward daily activities and other distractions.   10. Helpful Websites: www.AmericanHeadacheSociety.org PatentHood.ch www.headaches.org TightMarket.nl www.achenet.org

## 2023-05-22 ENCOUNTER — Ambulatory Visit (INDEPENDENT_AMBULATORY_CARE_PROVIDER_SITE_OTHER): Payer: BC Managed Care – PPO | Admitting: Family

## 2023-05-22 ENCOUNTER — Encounter: Payer: Self-pay | Admitting: Family

## 2023-05-22 VITALS — BP 105/59 | HR 67 | Temp 98.8°F | Resp 16 | Ht 67.0 in | Wt 210.0 lb

## 2023-05-22 DIAGNOSIS — Z23 Encounter for immunization: Secondary | ICD-10-CM | POA: Diagnosis not present

## 2023-05-22 DIAGNOSIS — K580 Irritable bowel syndrome with diarrhea: Secondary | ICD-10-CM | POA: Diagnosis not present

## 2023-05-22 DIAGNOSIS — F32A Depression, unspecified: Secondary | ICD-10-CM | POA: Diagnosis not present

## 2023-05-22 DIAGNOSIS — E039 Hypothyroidism, unspecified: Secondary | ICD-10-CM

## 2023-05-22 DIAGNOSIS — K8681 Exocrine pancreatic insufficiency: Secondary | ICD-10-CM | POA: Insufficient documentation

## 2023-05-22 DIAGNOSIS — D0512 Intraductal carcinoma in situ of left breast: Secondary | ICD-10-CM

## 2023-05-22 DIAGNOSIS — G43711 Chronic migraine without aura, intractable, with status migrainosus: Secondary | ICD-10-CM

## 2023-05-22 DIAGNOSIS — K219 Gastro-esophageal reflux disease without esophagitis: Secondary | ICD-10-CM

## 2023-05-22 LAB — TSH: TSH: 0.96 u[IU]/mL (ref 0.35–5.50)

## 2023-05-22 MED ORDER — DICYCLOMINE HCL 20 MG PO TABS: 20.0000 mg | ORAL_TABLET | Freq: Three times a day (TID) | ORAL | Status: AC

## 2023-05-22 NOTE — Patient Instructions (Signed)
VISIT SUMMARY:  You visited [place] today to establish primary care. You have a history of a total bilateral hysterectomy with bilateral salpingo-oophorectomy and bilateral mastectomy. You have been stable on levothyroxine for your thyroid condition, and your symptoms of exocrine pancreatic insufficiency (EPI) and irritable bowel syndrome (IBS) have improved with Creon and Bentyl. You also have a history of depression and hot flashes, and you take omeprazole daily for gastroesophageal reflux disease. Today, we discussed your overall health and made plans for ongoing management.  YOUR PLAN:  -HYPOTHYROIDISM: Hypothyroidism is a condition where your thyroid gland does not produce enough thyroid hormone. You are stable on your current dose of levothyroxine. We will check your TSH levels today and plan to recheck them in 6 months. Please continue your current dose of levothyroxine.  -EXOCRINE PANCREATIC INSUFFICIENCY (EPI) AND IRRITABLE BOWEL SYNDROME (IBS): EPI is a condition where your pancreas does not produce enough digestive enzymes, and IBS is a disorder affecting the large intestine. Your symptoms have improved with Creon and Bentyl. Please continue taking these medications as prescribed.  -GASTROINTESTINAL POLYPS: Gastrointestinal polyps are growths on the lining of your colon. You have a history of these polyps and should continue with your planned colonoscopy every 5 years to monitor for any changes.  -GENERAL HEALTH MAINTENANCE: We will administer the Tdap vaccine today. We also plan to have a formal physical in 6 months and will screen for HIV and Hepatitis C today.  INSTRUCTIONS:  Please follow up in 6 months for a formal physical and recheck of your TSH levels. Continue with your current medications and scheduled colonoscopies. If you have any new symptoms or concerns, please contact our office.

## 2023-05-22 NOTE — Assessment & Plan Note (Signed)
This has been stable and is followed by [facility] (Seeley Lomax NP).  Pt is maintained on Ajovy monthly injections and prn rizatriptan.

## 2023-05-22 NOTE — Assessment & Plan Note (Signed)
Clinically stable on levothyroxine 125 mcg daily. Update TSH today.

## 2023-05-22 NOTE — Assessment & Plan Note (Signed)
Stable, management per GI. Has bentyl on hand for prn use.

## 2023-05-22 NOTE — Assessment & Plan Note (Signed)
Reports stable mood off of citalopram.

## 2023-05-22 NOTE — Progress Notes (Signed)
Subjective:     Patient ID: [name], female    DOB: 1965/09/11, 57 y.o.   MRN: [id number]  Chief Complaint  Patient presents with   New Patient (Initial Visit)    Here for re-establish care    HPI  Discussed the use of AI scribe software for clinical note transcription with the patient, who gave verbal consent to proceed.  History of Present Illness    The patient, with a history of total bilateral hysterectomy with bilateral salpingo-oophorectomy and bilateral mastectomy, is seeking to re-establish primary care. She was last seen here in 2020. She has been followed by a gynecologist for the past thirty years, but feels the need for a primary care provider who can manage her overall health and not just gynecological issues. She has been stable on levothyroxine for her thyroid condition . Her GYN has been writing her synthroid, but she would like for [place] to take over the prescribing of her synthroid. . She has exocrine pancreatic insufficiency (EPI) and irritable bowel syndrome (IBS) with diarrhea, which she states took twelve years and three different gastroenterologists to diagnose. The patient's symptoms have improved with Creon and Bentyl, with bowel movements ranging from none to three loose stools in the morning. She also has a history of depression and hot flashes, previously managed with citalopram, but she is currently off this medication and reports feeling okay and no recent hot flashes. She takes omeprazole daily for gastroesophageal reflux disease and notices symptoms if she misses a dose.         Health Maintenance Due  Topic Date Due   COVID-19 Vaccine (1) Never done   Zoster Vaccines- Shingrix (1 of 2) Never done    Past Medical History:  Diagnosis Date   Abrasion of right arm 12/25/2016   Complication of anesthesia    panic attack while waking up   Detached retina, right 08/25/2018   History of breast cancer 2014   Hypothyroidism    Migraines    PONV  (postoperative nausea and vomiting)     Past Surgical History:  Procedure Laterality Date   BREAST LUMPECTOMY WITH NEEDLE LOCALIZATION Left 06/20/2013   Procedure: BREAST LUMPECTOMY WITH NEEDLE LOCALIZATION;  Surgeon: Robyne Askew, MD;  Location: [facility];  Service: General;  Laterality: Left;   BREAST RECONSTRUCTION Left 04/09/2017   Procedure: REVISION BREAST RECONSTRUCTION;  Surgeon: Peggye Form, DO;  Location: [facility];  Service: Plastics;  Laterality: Left;   CATARACT EXTRACTION Right 03/22/2019   LAPAROSCOPIC VAGINAL HYSTERECTOMY WITH SALPINGO OOPHORECTOMY Bilateral 06/17/2016   Procedure: LAPAROSCOPIC ASSISTED VAGINAL HYSTERECTOMY WITH SALPINGO OOPHORECTOMY;  Surgeon: Richardean Chimera, MD;  Location: [facility];  Service: Gynecology;  Laterality: Bilateral;  need bed   LIPOSUCTION WITH LIPOFILLING Left 04/09/2017   Procedure: LIPOFILLING TO THE LEFT UPPER BREAST TO IMPROVE SYMMETRY.;  Surgeon: Peggye Form, DO;  Location: [facility];  Service: Plastics;  Laterality: Left;   MASTECTOMY W/ SENTINEL NODE BIOPSY Bilateral 09/08/2016   Procedure: LEFT MASTECTOMY WITH LEFT SENTINEL LYMPH NODE BIOPSY, RIGHT PROPHYLACTIC MASTECTOMY;  Surgeon: Chevis Pretty III, MD;  Location: [facility];  Service: General;  Laterality: Bilateral;   PALATE TO GINGIVA GRAFT     REMOVAL OF BILATERAL TISSUE EXPANDERS WITH PLACEMENT OF BILATERAL BREAST IMPLANTS Bilateral 01/01/2017   Procedure: REMOVAL OF BILATERAL TISSUE EXPANDERS WITH PLACEMENT OF BILATERAL SILICONE BREAST IMPLANTS WITH LIPOSUCTION;  Surgeon: Peggye Form, DO;  Location: [facility];  Service: Plastics;  Laterality: Bilateral;   RETINAL DETACHMENT SURGERY Right 08/25/2018   TISSUE EXPANDER PLACEMENT Bilateral 09/08/2016   Procedure: IMMEDIATE PLACEMENT OF BILATERAL TISSUE EXPANDERS AFTER MASTECTOMIES;  Surgeon: Alena Bills Dillingham, DO;  Location: [facility];  Service:  Plastics;  Laterality: Bilateral;    Family History  Problem Relation Age of Onset   Breast cancer Mother 19   Heart disease Mother        Atrial fibrillation   Melanoma Father 61   COPD Father    Arthritis Father    Dementia Father    Pneumonia Father    Breast cancer Sister 68       ER+/her2-; onco score = 8   Cancer Maternal Aunt        oral cancer; smoker   Head & neck cancer Paternal Uncle        smoker   Stomach cancer Maternal Uncle    Breast cancer Cousin        dx in her late 39s-60s    Social History   Socioeconomic History   Marital status: Married    Spouse name: Trey Paula   Number of children: 0   Years of education: Masters   Highest education level: Not on file  Occupational History   Occupation: Medical Records  Tobacco Use   Smoking status: Never   Smokeless tobacco: Never  Vaping Use   Vaping status: Never Used  Substance and Sexual Activity   Alcohol use: Yes    Alcohol/week: 0.0 standard drinks of alcohol    Comment: occasionally    Drug use: No   Sexual activity: Yes  Other Topics Concern   Not on file  Social History Narrative   Lives at home with husband.   Right-handed.   1 cups caffeine daily.   Works as a Chemical engineer and human resources   Has a step daughter/grandson   One dog   Social Determinants of Corporate investment banker Strain: Not on [employer] Insecurity: Not on file  Transportation Needs: Not on file  Physical Activity: Not on file  Stress: Not on file  Social Connections: Not on file  Intimate Partner Violence: Not on file    Outpatient Medications Prior to Visit  Medication Sig Dispense Refill   CREON 12000-38000 units CPEP capsule Take 1 capsule by mouth 3 (three) times daily. + 36,000 units with 1 snack     Fremanezumab-vfrm (AJOVY) 225 MG/1.5ML SOAJ INJECT 225 MG UNDER THE SKIN EVERY 30 DAYS 4.5 mL 3   levothyroxine (SYNTHROID, LEVOTHROID) 125 MCG tablet Take 100 mcg by  mouth daily before breakfast.     MELATONIN PO Take 30 mg by mouth at bedtime.     omeprazole (PRILOSEC) 20 MG capsule Take 20 mg by mouth daily. At night     ondansetron (ZOFRAN-ODT) 4 MG disintegrating tablet Take 1 tablet (4 mg total) by mouth every 8 (eight) hours as needed for nausea. 20 tablet 1   rizatriptan (MAXALT-MLT) 10 MG disintegrating tablet Take 1 tab at onset of migraine.  May repeat in 2 hrs, if needed.  Max dose: 2 tabs/day. This is a 30 day prescription. 9 tablet 11   Melatonin 10 MG TABS Take 60 mg by mouth at bedtime.     tiZANidine (ZANAFLEX) 4 MG tablet Take 1 tablet (4 mg total) by mouth every 6 (six) hours as needed for muscle spasms. 30 tablet 11  No facility-administered medications prior to visit.    Allergies  Allergen Reactions   Penicillins Rash    ROS    See HPI Objective:    Physical Exam Constitutional:      General: She is not in acute distress.    Appearance: Normal appearance. She is well-developed.  HENT:     Head: Normocephalic and atraumatic.     Right Ear: Tympanic membrane, ear canal and external ear normal.     Left Ear: Tympanic membrane, ear canal and external ear normal.  Eyes:     General: No scleral icterus. Neck:     Thyroid: No thyromegaly.  Cardiovascular:     Rate and Rhythm: Normal rate and regular rhythm.     Heart sounds: Normal heart sounds. No murmur heard. Pulmonary:     Effort: Pulmonary effort is normal. No respiratory distress.     Breath sounds: Normal breath sounds. No wheezing.  Musculoskeletal:     Cervical back: Neck supple. No tenderness.  Skin:    General: Skin is warm and dry.  Neurological:     Mental Status: She is alert and oriented to person, place, and time.  Psychiatric:        Mood and Affect: Mood normal.        Behavior: Behavior normal.        Thought Content: Thought content normal.        Judgment: Judgment normal.      BP (!) 105/59 (BP Location: Right Arm, Patient Position:  Sitting, Cuff Size: Large)   Pulse 67   Temp 98.8 F (37.1 C) (Oral)   Resp 16   Ht 5\' 7"  (1.702 m)   Wt 210 lb (95.3 kg)   LMP 02/01/2014   SpO2 96%   BMI 32.89 kg/m  Wt Readings from Last 3 Encounters:  05/22/23 210 lb (95.3 kg)  09/26/22 223 lb (101.2 kg)  09/25/21 227 lb (103 kg)       Assessment & Plan:   Problem List Items Addressed This Visit       Unprioritized   Irritable bowel syndrome with diarrhea    Stable, management per GI. Has bentyl on hand for prn use.        Hypothyroidism - Primary    Clinically stable on levothyroxine 125 mcg daily. Update TSH today.       Relevant Orders   TSH   Gastroesophageal reflux disease    Stable on omeprazole 20mg  once daily.      Exocrine pancreatic insufficiency    Stable on creon, managed by GI (Dr. Georgiann Mohs)      Ductal carcinoma in situ (DCIS) of left breast    S/p bilateral mastectomy. Follows with Dr. Myna Hidalgo at the [facility] for surveillance.       Depression    Reports stable mood off of citalopram.       Chronic migraine without aura, with intractable migraine, so stated, with status migrainosus    This has been stable and is followed by [facility] (Emery Lomax NP).  Pt is maintained on Ajovy monthly injections and prn rizatriptan.       Other Visit Diagnoses     Need for diphtheria-tetanus-pertussis (Tdap) vaccine       Relevant Orders   Tdap vaccine greater than or equal to 7yo IM (Completed)       I have discontinued [facility]. Out[facility]'s tiZANidine and Melatonin. I am also having her maintain her levothyroxine, MELATONIN PO,  omeprazole, Creon, rizatriptan, ondansetron, and Ajovy.  No orders of the defined types were placed in this encounter.

## 2023-05-22 NOTE — Assessment & Plan Note (Signed)
S/p bilateral mastectomy. Follows with Dr. Myna Hidalgo at the [facility] for surveillance.

## 2023-05-22 NOTE — Assessment & Plan Note (Signed)
Stable on omeprazole '20mg'$  once daily.  ?

## 2023-05-22 NOTE — Assessment & Plan Note (Signed)
Stable on creon, managed by GI (Dr. Georgiann Mohs)

## 2023-06-09 DIAGNOSIS — H25012 Cortical age-related cataract, left eye: Secondary | ICD-10-CM | POA: Diagnosis not present

## 2023-06-09 DIAGNOSIS — H35371 Puckering of macula, right eye: Secondary | ICD-10-CM | POA: Diagnosis not present

## 2023-06-09 DIAGNOSIS — H2512 Age-related nuclear cataract, left eye: Secondary | ICD-10-CM | POA: Diagnosis not present

## 2023-06-09 DIAGNOSIS — H18522 Epithelial (juvenile) corneal dystrophy, left eye: Secondary | ICD-10-CM | POA: Diagnosis not present

## 2023-08-11 ENCOUNTER — Encounter: Payer: Self-pay | Admitting: Family Medicine

## 2023-08-13 ENCOUNTER — Other Ambulatory Visit: Payer: Self-pay | Admitting: Family

## 2023-08-13 ENCOUNTER — Other Ambulatory Visit (HOSPITAL_COMMUNITY): Payer: Self-pay

## 2023-08-13 ENCOUNTER — Telehealth: Payer: Self-pay

## 2023-08-13 NOTE — Telephone Encounter (Signed)
Pharmacy Patient Advocate Encounter   Received notification from RX Request Messages that prior authorization for AJOVY (fremanezumab-vfrm) injection 225MG /1.5ML auto-injectors is required/requested.   Insurance verification completed.   The patient is insured through [facility] .   Per test claim: PA required; PA submitted to above mentioned insurance via CoverMyMeds Key/confirmation #/EOC [id number] Status is pending

## 2023-08-14 ENCOUNTER — Other Ambulatory Visit (HOSPITAL_COMMUNITY): Payer: Self-pay

## 2023-08-14 NOTE — Telephone Encounter (Signed)
Pharmacy Patient Advocate Encounter  Received notification from [facility] that Prior Authorization for AJOVY (fremanezumab-vfrm) injection 225MG /1.5ML auto-injectors has been APPROVED from 08/13/2023 to 08/11/2024. Unable to obtain price due to refill too soon rejection, last fill date 08/13/2023 next available fill date4/07/2023   PA #/Case ID/Reference #: PA Case ID #: [id number]

## 2023-08-17 ENCOUNTER — Other Ambulatory Visit: Payer: Self-pay | Admitting: Family

## 2023-09-04 ENCOUNTER — Encounter: Payer: Self-pay | Admitting: Family

## 2023-09-04 ENCOUNTER — Other Ambulatory Visit: Payer: Self-pay

## 2023-09-04 DIAGNOSIS — E039 Hypothyroidism, unspecified: Secondary | ICD-10-CM

## 2023-09-04 MED ORDER — LEVOTHYROXINE SODIUM 100 MCG PO TABS: 100.0000 ug | ORAL_TABLET | Freq: Every day | ORAL | 1 refills | Status: DC

## 2023-09-25 ENCOUNTER — Inpatient Hospital Stay (HOSPITAL_BASED_OUTPATIENT_CLINIC_OR_DEPARTMENT_OTHER): Payer: BC Managed Care – PPO | Admitting: Hematology & Oncology

## 2023-09-25 ENCOUNTER — Inpatient Hospital Stay: Payer: 59 | Attending: Family

## 2023-09-25 VITALS — BP 111/60 | HR 72 | Temp 98.3°F | Resp 17 | Wt 204.1 lb

## 2023-09-25 DIAGNOSIS — D0512 Intraductal carcinoma in situ of left breast: Secondary | ICD-10-CM | POA: Diagnosis not present

## 2023-09-25 DIAGNOSIS — D0502 Lobular carcinoma in situ of left breast: Secondary | ICD-10-CM

## 2023-09-25 DIAGNOSIS — Z86000 Personal history of in-situ neoplasm of breast: Secondary | ICD-10-CM | POA: Diagnosis not present

## 2023-09-25 DIAGNOSIS — Z9013 Acquired absence of bilateral breasts and nipples: Secondary | ICD-10-CM | POA: Diagnosis not present

## 2023-09-25 LAB — CBC WITH DIFFERENTIAL (CANCER CENTER ONLY)
Abs Immature Granulocytes: 0.01 10*3/uL (ref 0.00–0.07)
Basophils Absolute: 0.1 10*3/uL (ref 0.0–0.1)
Basophils Relative: 1 %
Eosinophils Absolute: 0.1 10*3/uL (ref 0.0–0.5)
Eosinophils Relative: 3 %
HCT: 41.3 % (ref 36.0–46.0)
Hemoglobin: 14.2 g/dL (ref 12.0–15.0)
Immature Granulocytes: 0 %
Lymphocytes Relative: 49 %
Lymphs Abs: 2.4 10*3/uL (ref 0.7–4.0)
MCH: 30.3 pg (ref 26.0–34.0)
MCHC: 34.4 g/dL (ref 30.0–36.0)
MCV: 88.2 fL (ref 80.0–100.0)
Monocytes Absolute: 0.4 10*3/uL (ref 0.1–1.0)
Monocytes Relative: 8 %
Neutro Abs: 1.9 10*3/uL (ref 1.7–7.7)
Neutrophils Relative %: 39 %
Platelet Count: 295 10*3/uL (ref 150–400)
RBC: 4.68 MIL/uL (ref 3.87–5.11)
RDW: 12.4 % (ref 11.5–15.5)
WBC Count: 4.9 10*3/uL (ref 4.0–10.5)
nRBC: 0 % (ref 0.0–0.2)

## 2023-09-25 LAB — CMP (CANCER CENTER ONLY)
ALT: 16 U/L (ref 0–44)
AST: 19 U/L (ref 15–41)
Albumin: 4.4 g/dL (ref 3.5–5.0)
Alkaline Phosphatase: 63 U/L (ref 38–126)
Anion gap: 10 (ref 5–15)
BUN: 11 mg/dL (ref 6–20)
CO2: 25 mmol/L (ref 22–32)
Calcium: 9.2 mg/dL (ref 8.9–10.3)
Chloride: 100 mmol/L (ref 98–111)
Creatinine: 0.54 mg/dL (ref 0.44–1.00)
GFR, Estimated: >60 mL/min (ref >60)
Glucose, Bld: 96 mg/dL (ref 70–99)
Potassium: 4 mmol/L (ref 3.5–5.1)
Sodium: 135 mmol/L (ref 135–145)
Total Bilirubin: 0.5 mg/dL (ref 0.0–1.2)
Total Protein: 7.7 g/dL (ref 6.5–8.1)

## 2023-09-25 NOTE — Progress Notes (Signed)
 Hematology and Oncology Follow Up Visit  [name] [id number] [birth year] 58 y.o. 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  She is doing quite nicely.  She is still working.  She really has had no issues with respect to her health since we last saw her.  She has had no problems with COVID.  Cough or shortness of breath.  She has had no change in bowel or bladder habits.  She has had no further issues with her gallbladder or pancreas.  She has had no bleeding.  There is been no leg swelling.  She has had no rashes.  Overall, I would say that her performance status is ECOG 0.    Medications:  Allergies as of 09/25/2023       Reactions   Penicillins Rash        Medication List        Accurate as of September 25, 2023  8:06 AM. If you have any questions, ask your nurse or doctor.          Ajovy 225 MG/1.5ML Soaj Generic drug: Fremanezumab-vfrm INJECT 225 MG UNDER THE SKIN EVERY 30 DAYS   Creon 12000-38000 units Cpep capsule Generic drug: lipase/protease/amylase Take 3 capsules by mouth 3 (three) times daily. + 36,000 units with 1 snack   dicyclomine 20 MG tablet Commonly known as: BENTYL Take 1 tablet (20 mg total) by mouth 4 (four) times daily -  before meals and at bedtime.   levothyroxine 100 MCG tablet Commonly known as: SYNTHROID Take 1 tablet (100 mcg total) by mouth daily before breakfast.   MELATONIN PO Take 30 mg by mouth at bedtime.   omeprazole 20 MG capsule Commonly known as: PRILOSEC Take 20 mg by mouth daily. At night   ondansetron 4 MG disintegrating tablet Commonly known as: ZOFRAN-ODT Take 1 tablet (4 mg total) by mouth every 8 (eight) hours as needed for nausea.   rizatriptan 10 MG disintegrating tablet Commonly known as: MAXALT-MLT Take 1 tab at onset  of migraine.  May repeat in 2 hrs, if needed.  Max dose: 2 tabs/day. This is a 30 day prescription.        Allergies:  Allergies  Allergen Reactions   Penicillins Rash    Past Medical History, Surgical history, Social history, and Family History were reviewed and updated.  Review of Systems: Review of Systems  Constitutional: Negative.   HENT: Negative.    Eyes: Negative.   Respiratory: Negative.    Cardiovascular: Negative.   Gastrointestinal: Negative.   Genitourinary: Negative.   Musculoskeletal: Negative.   Skin: Negative.   Neurological: Negative.   Endo/Heme/Allergies: Negative.   Psychiatric/Behavioral: Negative.       Physical Exam:  weight is 204 lb 1.9 oz (92.6 kg). Her oral temperature is 98.3 F (36.8 C). Her blood pressure is 111/60 and her pulse is 72. Her respiration is 17 and oxygen saturation is 97%.   Wt Readings from Last 3 Encounters:  09/25/23 204 lb 1.9 oz (92.6 kg)  05/22/23 210 lb (95.3 kg)  09/26/22 223 lb (101.2 kg)    Physical Exam Vitals reviewed.  Constitutional:      Comments: She had bilateral breast implants.  There is no erythema or warmth.  There is no bilateral axillary adenopathy.  HENT:     Head: Normocephalic and atraumatic.  Eyes:     Pupils: Pupils are equal, round, and reactive to light.  Cardiovascular:     Rate and Rhythm: Normal rate and regular rhythm.     Heart sounds: Normal heart sounds.  Pulmonary:     Effort: Pulmonary effort is normal.     Breath sounds: Normal breath sounds.  Abdominal:     General: Bowel sounds are normal.     Palpations: Abdomen is soft.  Musculoskeletal:        General: No tenderness or deformity. Normal range of motion.     Cervical back: Normal range of motion.  Lymphadenopathy:     Cervical: No cervical adenopathy.  Skin:    General: Skin is warm and dry.     Findings: No erythema or rash.  Neurological:     Mental Status: She is alert and oriented to person, place, and time.   Psychiatric:        Behavior: Behavior normal.        Thought Content: Thought content normal.        Judgment: Judgment normal.     Lab Results  Component Value Date   WBC 4.9 09/25/2023   HGB 14.2 09/25/2023   HCT 41.3 09/25/2023   MCV 88.2 09/25/2023   PLT 295 09/25/2023   No results found for: "FERRITIN", "IRON", "TIBC", "UIBC", "IRONPCTSAT" Lab Results  Component Value Date   RBC 4.68 09/25/2023   No results found for: "KPAFRELGTCHN", "LAMBDASER", "KAPLAMBRATIO" No results found for: "IGGSERUM", "IGA", "IGMSERUM" No results found for: "TOTALPROTELP", "ALBUMINELP", "A1GS", "A2GS", "BETS", "BETA2SER", "GAMS", "MSPIKE", "SPEI"   Chemistry      Component Value Date/Time   NA 137 09/26/2022 0826   NA 139 01/29/2017 0747   NA 138 05/30/2016 0746   K 4.8 09/26/2022 0826   K 3.8 01/29/2017 0747   K 3.8 05/30/2016 0746   CL 101 09/26/2022 0826   CL 105 01/29/2017 0747   CO2 29 09/26/2022 0826   CO2 30 01/29/2017 0747   CO2 25 05/30/2016 0746   BUN 9 09/26/2022 0826   BUN 7 01/29/2017 0747   BUN 10.0 05/30/2016 0746   CREATININE 0.74 09/26/2022 0826   CREATININE 0.8 01/29/2017 0747   CREATININE 0.7 05/30/2016 0746      Component Value Date/Time   CALCIUM 9.9 09/26/2022 0826   CALCIUM 9.3 01/29/2017 0747   CALCIUM 9.2 05/30/2016 0746   ALKPHOS 65 09/26/2022 0826   ALKPHOS 70 01/29/2017 0747   ALKPHOS 71 05/30/2016 0746   AST 14 (L) 09/26/2022 0826   AST 16 05/30/2016 0746   ALT 15 09/26/2022 0826   ALT 18 01/29/2017 0747   ALT 11 05/30/2016 0746   BILITOT 0.5 09/26/2022 0826   BILITOT 0.83 05/30/2016 0746       Impression and Plan: [name] is a very pleasant 42 yo caucasian female with multifocal ductal carcinoma in situ of the left breast. She had bilateral mastectomies in February 2018.   From my point of view, everything is doing quite well.  Am happy that her health is doing well.  I know that she is still working.  I know her birthday is coming  up in 4 weeks.  I will that she will be able to celebrate.  We will plan to get her back in 1 year.   Josph Macho, MD 2/28/20258:06 AM

## 2023-11-05 IMAGING — US US BREAST*L* LIMITED INC AXILLA
1 series · 10 of 10 positions shown · non-contrast
Comparison: Previous exams including LEFT breast ultrasound dated
08/18/2019.

CLINICAL DATA: Patient describes a palpable lump within the far
inner LEFT breast (adjacent to the sternum) for several weeks and
chronic/intermittent pain in the same area. History of LEFT breast
cancer status post bilateral mastectomies with
reconstruction/implants.

EXAM:
ULTRASOUND OF THE LEFT BREAST

[Series 1: us breast*left* limited inc axilla · 0.05mm/px · 10 of 10 slices shown]
[im 1/10]
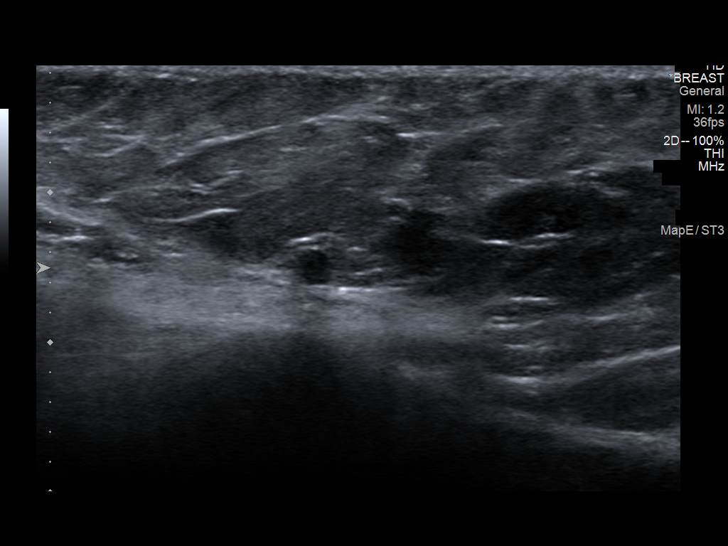
[im 2/10]
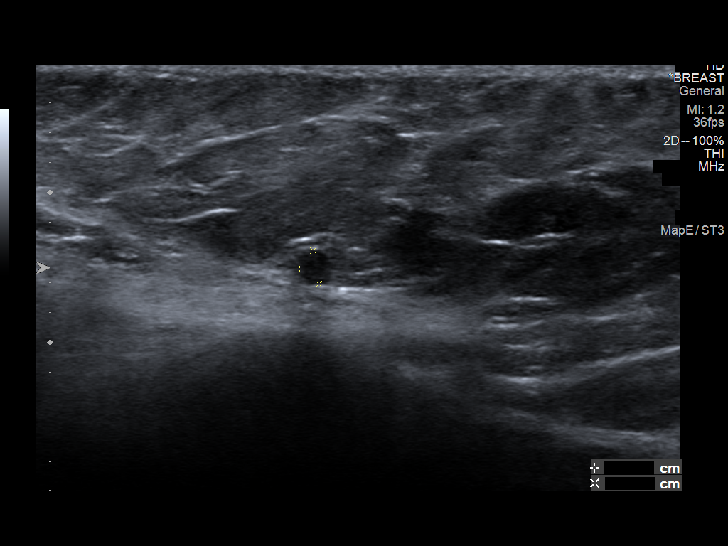
[im 3/10]
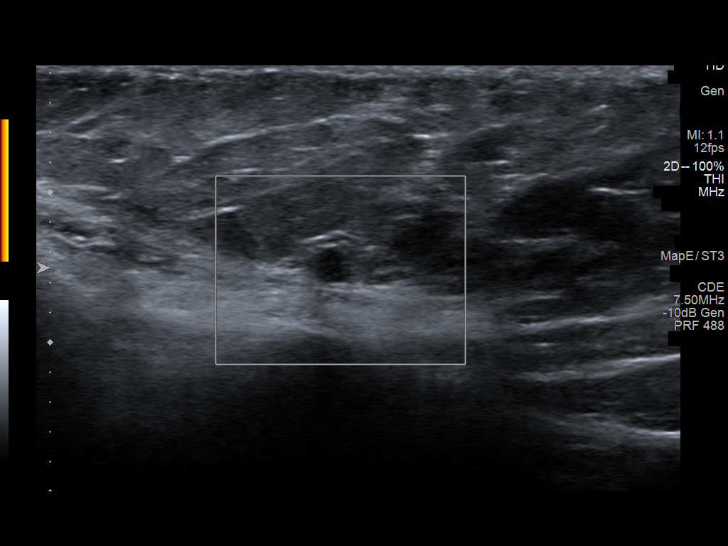
[im 4/10]
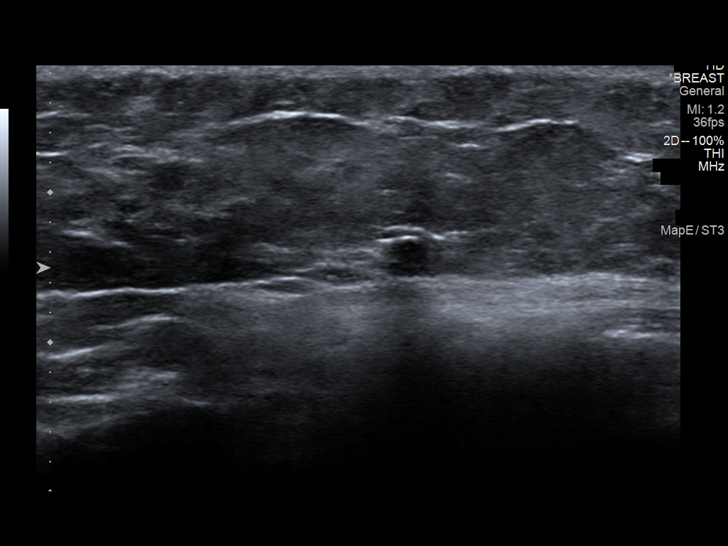
[im 5/10]
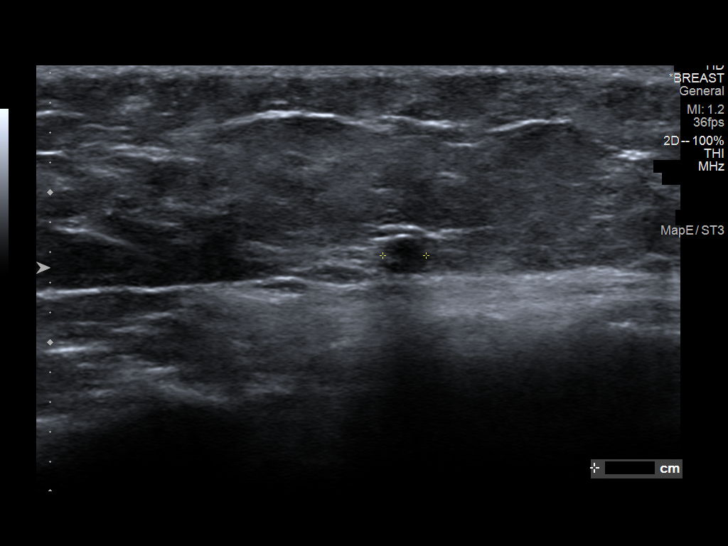
[im 6/10]
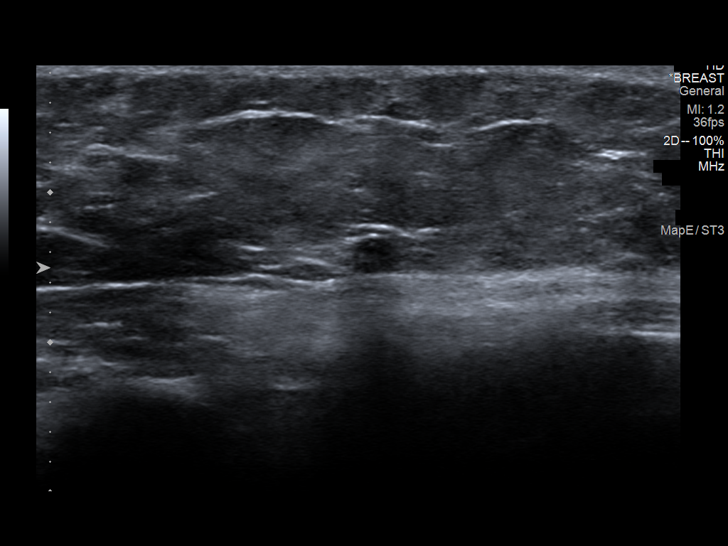
[im 7/10]
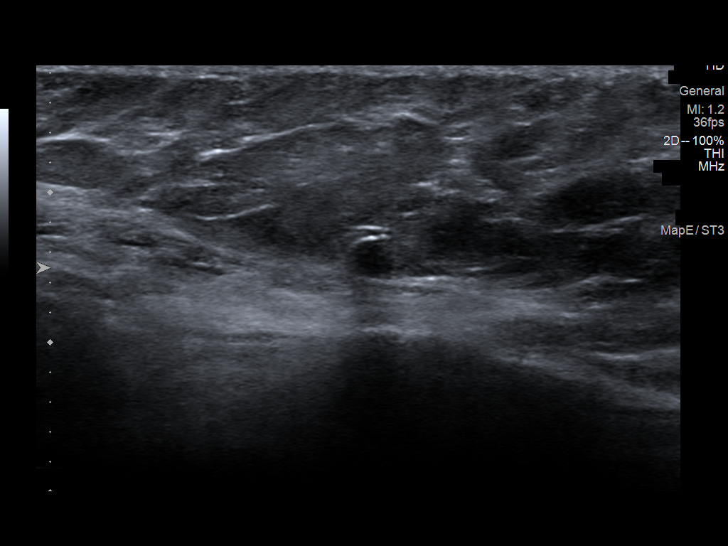
[im 8/10]
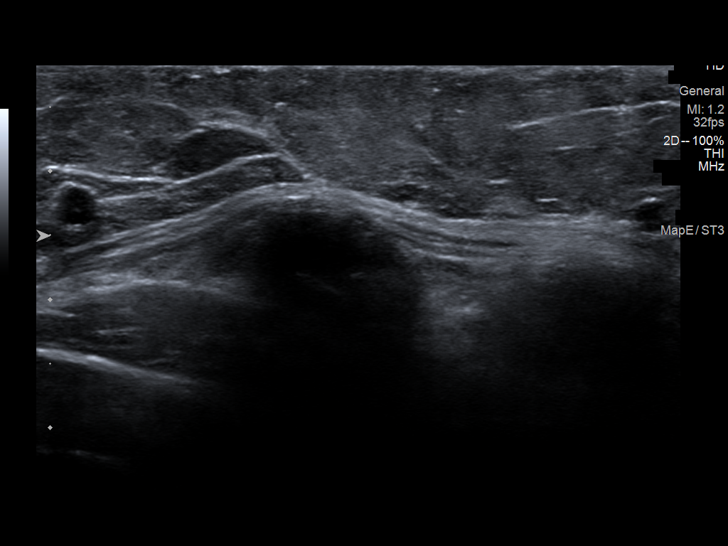
[im 9/10]
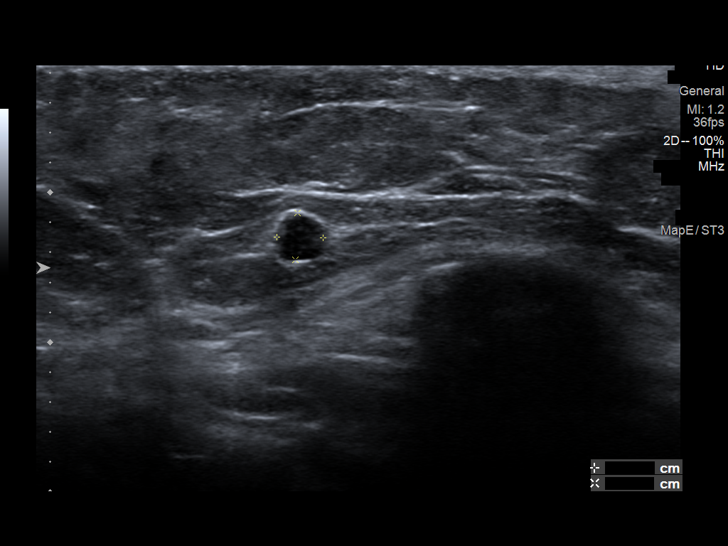
[im 10/10]
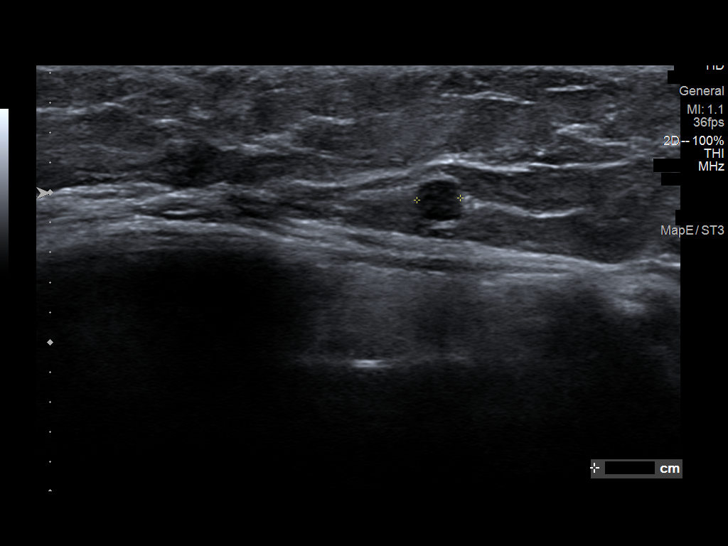

[10 of 10 positions shown; findings below may reference images not displayed]

FINDINGS: On physical exam, there is vague thickening within the inner LEFT
breast but I do not feel a fixed circumscribed mass in this area.

Targeted ultrasound is performed, evaluating the far inner LEFT
breast, 9 o'clock axis region, as directed by the patient, showing 2
stable benign oil cysts within the deeper soft tissues of the LEFT
chest wall, largest measuring 3 mm. No suspicious solid or cystic
mass is identified.
IMPRESSION: 1. No evidence of malignancy within the inner LEFT breast.
2. Stable benign oil cysts within the LEFT breast at the 9 o'clock
axis, largest measuring 3 mm, corresponding to the area of clinical
concern.

RECOMMENDATION:
1. Clinical follow-up. The patient was instructed to return if the
area that she feels becomes larger and/or firmer to palpation, or if
a new palpable abnormality is identified.
2. Benign causes of breast/chest wall pain were discussed with the
patient. The patient was encouraged to follow-up with referring
physician if the pain persisted or worsened as this might indicate a
need to evaluate the deeper structures of the underlying chest wall
with chest x-ray, chest CT and/or bone scan given patient's history
of previous breast cancer.

I have discussed the findings and recommendations with the patient.
If applicable, a reminder letter will be sent to the patient
regarding the next appointment.

BI-RADS CATEGORY  2: Benign.

## 2023-11-06 DIAGNOSIS — K219 Gastro-esophageal reflux disease without esophagitis: Secondary | ICD-10-CM | POA: Diagnosis not present

## 2023-11-06 DIAGNOSIS — K8689 Other specified diseases of pancreas: Secondary | ICD-10-CM | POA: Diagnosis not present

## 2023-11-20 ENCOUNTER — Encounter: Payer: Self-pay | Admitting: Family

## 2023-11-20 ENCOUNTER — Ambulatory Visit: Payer: BC Managed Care – PPO | Admitting: Family

## 2023-11-20 VITALS — BP 104/61 | HR 71 | Temp 98.7°F | Resp 16 | Ht 67.0 in | Wt 206.0 lb

## 2023-11-20 DIAGNOSIS — K580 Irritable bowel syndrome with diarrhea: Secondary | ICD-10-CM | POA: Diagnosis not present

## 2023-11-20 DIAGNOSIS — F32A Depression, unspecified: Secondary | ICD-10-CM | POA: Diagnosis not present

## 2023-11-20 DIAGNOSIS — D0502 Lobular carcinoma in situ of left breast: Secondary | ICD-10-CM | POA: Diagnosis not present

## 2023-11-20 DIAGNOSIS — G43809 Other migraine, not intractable, without status migrainosus: Secondary | ICD-10-CM

## 2023-11-20 DIAGNOSIS — E039 Hypothyroidism, unspecified: Secondary | ICD-10-CM

## 2023-11-20 LAB — TSH: TSH: 0.87 u[IU]/mL (ref 0.35–5.50)

## 2023-11-20 NOTE — Assessment & Plan Note (Signed)
 Currently stable. Not on meds.

## 2023-11-20 NOTE — Patient Instructions (Signed)
 Please complete lab work prior to leaving.

## 2023-11-20 NOTE — Assessment & Plan Note (Signed)
 This has improved since last visit. Following with Dr. Colbert Dates.

## 2023-11-20 NOTE — Assessment & Plan Note (Signed)
Clinically stable on synthroid. Update TSH.

## 2023-11-20 NOTE — Assessment & Plan Note (Signed)
 Has noted increased frequency recently despite Ajovy .  She is following with neuro for migraine management.

## 2023-11-20 NOTE — Progress Notes (Signed)
 Subjective:     Patient ID: [name], female    DOB: May 04, 1966, 58 y.o.   MRN: [id number]  Chief Complaint  Patient presents with   Hypothyroidism    Here for follow up    HPI  Discussed the use of AI scribe software for clinical note transcription with the patient, who gave verbal consent to proceed.  History of Present Illness   Hypothyroid- maintained on synthroid .clinically stable on synthroid .  Pancreatic insufficiency- maintained on creon.   Gerd- maintained on omeprazole.   Headaches- were good until last months which she attributes to stress.  Follows with Neurology (Dr. Tresia Fruit).   IBS- fewer BM's which she is pleased with.      Health Maintenance Due  Topic Date Due   COVID-19 Vaccine (1) Never done    Past Medical History:  Diagnosis Date   Abrasion of right arm 12/25/2016   Complication of anesthesia    panic attack while waking up   Detached retina, right 08/25/2018   History of breast cancer 2014   Hypothyroidism    Migraines    PONV (postoperative nausea and vomiting)     Past Surgical History:  Procedure Laterality Date   BREAST LUMPECTOMY WITH NEEDLE LOCALIZATION Left 06/20/2013   Procedure: BREAST LUMPECTOMY WITH NEEDLE LOCALIZATION;  Surgeon: Mayme Spearman, MD;  Location: [facility];  Service: General;  Laterality: Left;   BREAST RECONSTRUCTION Left 04/09/2017   Procedure: REVISION BREAST RECONSTRUCTION;  Surgeon: Thornell Flirt, DO;  Location: [facility];  Service: Plastics;  Laterality: Left;   CATARACT EXTRACTION Right 03/22/2019   LAPAROSCOPIC VAGINAL HYSTERECTOMY WITH SALPINGO OOPHORECTOMY Bilateral 06/17/2016   Procedure: LAPAROSCOPIC ASSISTED VAGINAL HYSTERECTOMY WITH SALPINGO OOPHORECTOMY;  Surgeon: Merryl Abraham, MD;  Location: [facility];  Service: Gynecology;  Laterality: Bilateral;  need bed   LIPOSUCTION WITH LIPOFILLING Left 04/09/2017   Procedure: LIPOFILLING TO THE  LEFT UPPER BREAST TO IMPROVE SYMMETRY.;  Surgeon: Thornell Flirt, DO;  Location: [facility];  Service: Plastics;  Laterality: Left;   MASTECTOMY W/ SENTINEL NODE BIOPSY Bilateral 09/08/2016   Procedure: LEFT MASTECTOMY WITH LEFT SENTINEL LYMPH NODE BIOPSY, RIGHT PROPHYLACTIC MASTECTOMY;  Surgeon: Lillette Reid III, MD;  Location: [facility];  Service: General;  Laterality: Bilateral;   PALATE TO GINGIVA GRAFT     REMOVAL OF BILATERAL TISSUE EXPANDERS WITH PLACEMENT OF BILATERAL BREAST IMPLANTS Bilateral 01/01/2017   Procedure: REMOVAL OF BILATERAL TISSUE EXPANDERS WITH PLACEMENT OF BILATERAL SILICONE BREAST IMPLANTS WITH LIPOSUCTION;  Surgeon: Thornell Flirt, DO;  Location: [facility];  Service: Plastics;  Laterality: Bilateral;   RETINAL DETACHMENT SURGERY Right 08/25/2018   TISSUE EXPANDER PLACEMENT Bilateral 09/08/2016   Procedure: IMMEDIATE PLACEMENT OF BILATERAL TISSUE EXPANDERS AFTER MASTECTOMIES;  Surgeon: Lindaann Requena Dillingham, DO;  Location: [facility];  Service: Plastics;  Laterality: Bilateral;    Family History  Problem Relation Age of Onset   Breast cancer Mother 48   Heart disease Mother        Atrial fibrillation   Melanoma Father 41   COPD Father    Arthritis Father    Dementia Father    Pneumonia Father    Breast cancer Sister 3       ER+/her2-; onco score = 8   Cancer Maternal Aunt        oral cancer; smoker   Head & neck cancer Paternal Uncle        smoker  Stomach cancer Maternal Uncle    Breast cancer Cousin        dx in her late 61s-60s    Social History   Socioeconomic History   Marital status: Married    Spouse name: Dee Farber   Number of children: 0   Years of education: Masters   Highest education level: Not on file  Occupational History   Occupation: Medical Records  Tobacco Use   Smoking status: Never   Smokeless tobacco: Never  Vaping Use   Vaping status: Never Used  Substance and Sexual Activity   Alcohol use: Yes     Alcohol/week: 0.0 standard drinks of alcohol    Comment: occasionally    Drug use: No   Sexual activity: Yes  Other Topics Concern   Not on file  Social History Narrative   Lives at home with husband.   Right-handed.   1 cups caffeine daily.   Works as a Chemical engineer and human resources   Has a step daughter/grandson   One dog   Social Drivers of Corporate investment banker Strain: Not on [employer] Insecurity: Low Risk  (11/06/2023)   Received from [facility]   Hunger Vital Sign    Worried About Running Out of Food in the Last Year: Never true    Ran Out of Food in the Last Year: Never true  Transportation Needs: No Transportation Needs (11/06/2023)   Received from [facility]    In the past 12 months, has lack of reliable transportation kept you from medical appointments, meetings, work or from getting things needed for daily living? : No  Physical Activity: Not on file  Stress: Not on file  Social Connections: Not on file  Intimate Partner Violence: Not on file    Outpatient Medications Prior to Visit  Medication Sig Dispense Refill   CREON 12000-38000 units CPEP capsule Take 3 capsules by mouth 3 (three) times daily. + 36,000 units with 1 snack     dicyclomine  (BENTYL ) 20 MG tablet Take 1 tablet (20 mg total) by mouth 4 (four) times daily -  before meals and at bedtime.     Fremanezumab -vfrm (AJOVY ) 225 MG/1.5ML SOAJ INJECT 225 MG UNDER THE SKIN EVERY 30 DAYS 4.5 mL 3   levothyroxine  (SYNTHROID ) 100 MCG tablet Take 1 tablet (100 mcg total) by mouth daily before breakfast. 90 tablet 1   MELATONIN PO Take 30 mg by mouth at bedtime.     omeprazole (PRILOSEC) 20 MG capsule Take 20 mg by mouth daily. At night     ondansetron  (ZOFRAN -ODT) 4 MG disintegrating tablet Take 1 tablet (4 mg total) by mouth every 8 (eight) hours as needed for nausea. 20 tablet 1   rizatriptan  (MAXALT -MLT) 10 MG disintegrating tablet Take 1 tab  at onset of migraine.  May repeat in 2 hrs, if needed.  Max dose: 2 tabs/day. This is a 30 day prescription. 9 tablet 11   No facility-administered medications prior to visit.    Allergies  Allergen Reactions   Penicillins Rash    ROS    See HPI Objective:    Physical Exam Constitutional:      General: She is not in acute distress.    Appearance: Normal appearance. She is well-developed.  HENT:     Head: Normocephalic and atraumatic.     Right Ear: External ear normal.     Left Ear: External ear normal.  Eyes:  General: No scleral icterus. Neck:     Thyroid : No thyromegaly.  Cardiovascular:     Rate and Rhythm: Normal rate and regular rhythm.     Heart sounds: Normal heart sounds. No murmur heard. Pulmonary:     Effort: Pulmonary effort is normal. No respiratory distress.     Breath sounds: Normal breath sounds. No wheezing.  Musculoskeletal:     Cervical back: Neck supple.  Skin:    General: Skin is warm and dry.  Neurological:     Mental Status: She is alert and oriented to person, place, and time.  Psychiatric:        Mood and Affect: Mood normal.        Behavior: Behavior normal.        Thought Content: Thought content normal.        Judgment: Judgment normal.      BP 104/61 (BP Location: Right Arm, Patient Position: Sitting, Cuff Size: Small)   Pulse 71   Temp 98.7 F (37.1 C) (Oral)   Resp 16   Ht 5\' 7"  (1.702 m)   Wt 206 lb (93.4 kg)   LMP 02/01/2014   SpO2 99%   BMI 32.26 kg/m  Wt Readings from Last 3 Encounters:  11/20/23 206 lb (93.4 kg)  09/25/23 204 lb 1.9 oz (92.6 kg)  05/22/23 210 lb (95.3 kg)       Assessment & Plan:   Problem List Items Addressed This Visit       Unprioritized   Neoplasm of left breast, primary tumor staging category Tis - Primary   S/p bilateral mastectomy- following regularly with oncology.       Migraine   Has noted increased frequency recently despite Ajovy .  She is following with neuro for migraine  management.       Irritable bowel syndrome with diarrhea   This has improved since last visit. Following with Dr. Colbert Dates.       Hypothyroidism   Clinically stable on synthroid .  Update TSH.      Relevant Orders   TSH   Depression   Currently stable. Not on meds.       Offered Shingrix, pt declined.   I am having Eniya ROvidio Blower maintain her MELATONIN PO, omeprazole, Creon, rizatriptan , ondansetron , Ajovy , dicyclomine , and levothyroxine .  No orders of the defined types were placed in this encounter.

## 2023-11-20 NOTE — Assessment & Plan Note (Signed)
 S/p bilateral mastectomy- following regularly with oncology.

## 2024-02-12 ENCOUNTER — Encounter: Payer: Self-pay | Admitting: Advanced Practice Midwife

## 2024-03-03 ENCOUNTER — Other Ambulatory Visit: Payer: Self-pay | Admitting: Family

## 2024-03-03 DIAGNOSIS — E039 Hypothyroidism, unspecified: Secondary | ICD-10-CM

## 2024-04-25 NOTE — Progress Notes (Unsigned)
 PATIENT: [name] DOB: Apr 08, 1966  REASON FOR VISIT: follow up HISTORY FROM: patient  Virtual Visit via Mychart Video Note  I connected with [name] on 04/26/24 at  8:45 AM EDT by Mychart video and verified that I am speaking with the correct person using two identifiers.   I discussed the limitations, risks, security and privacy concerns of performing an evaluation and management service by telephone and the availability of in person appointments. I also discussed with the patient that there may be a patient responsible charge related to this service. The patient expressed understanding and agreed to proceed.   History of Present Illness:  04/26/24 ALL: [name] returns for follow up for migraines. She is doing well on Ajovy  and rizatriptan . She may have 1 migraine per month, on average. She can go a couple of months with no headaches and then have one that lingers for a few days. Rizatriptan  and ondansetron  usually help abort migraine and nausea. She is feeling well and without concerns, today.   04/21/2023 ALL:  [name] returns for follow up for migraines. She continues Ajovy  and rizatriptan . She continues to do well. She may have 1 breakthrough migraine per month. Rizatriptan  works well. Ondansetron  helps with nausea.   04/17/2022 ALL: [name] returns for follow up for migraines. She continues Ajovy  and rizatriptan . She reports migraines are well managed. She may have 2-3 breakthrough headaches a month. Rizatriptan  usually works well for abortive therapy. She may take tizanidine  and or ondansetron  as well. She is feeling well and without concerns.   03/26/2021 ALL:  [name] is a 58 y.o. female here today for follow up for migraines. She was previous on Amovig but switched to Ajovy  due to cost. She feels that she is doing fairly well. She does have a few breakthrough seizures toward the end of the month just before next dose. Rizatriptan  seems to work well for abortive  therapy. She took Ubrelvy  once but did not repeat dose. She is not sure it worked. Insurance would not cover Ubrelvy . She rarely takes tizanidine . It makes her sleepy. She is having about 2-3 migraines a month. Stress is most common trigger.   History (copied from Dr Sharion previous note)  Interval history March 25, 2020: Patient is here for follow-up of migraines, have not seen her in close to 18 months, MRI of the brain in October 2020 (personally reviewed images) was normal. Was on Aimovig , did great, the card expired. Last time she had the Aimovig  was in July, it comes in waves, she wants to stay on the Aimovig . Headaches were reduced 50%,  She tried Nurtec. She is concerned about vasoconstriction. 2 migraines per week.She was happy on the Aimovig .    Interval history 5-6 headache days a month. She had cataract surgery, she sometimes sees spirals in both eyes sometimes associated within 30 minutes of a headache other times just the spirals. She smells cigarette smoke, can happen for days, it comes and goes for several years. She had side effects to sumatriptan  injections makes her feel weired. The maxalt  still works but she as to take 2 and sthe migraine rebounds that day.   HPI:  [name] is a 58 y.o. female here as requested by provider Daryl Setter, NP for migraines. She has tried nortriptyline , maxalt , zofran , magnesium  and riboflavin , celexa , tizanidine . At least 18 headache days a month, all are migrainous and moderately-severe to severe. Ongoing for over a year at this severity and frequency.Migraines can  last 24-72 hours. +nausea, +vomiting, severe and pounding, +light and sound sensitivity, starts on the left, movement makes it worse. Started in 1994. Started worsening in 30s. Currently no medication overuse.    Observations/Objective:  Generalized: Well developed, in no acute distress  Mentation: Alert oriented to time, place, history taking. Follows all commands speech  and language fluent   Assessment and Plan:  58 y.o. year old female  has a past medical history of Abrasion of right arm (12/25/2016), Complication of anesthesia, Detached retina, right (08/25/2018), History of breast cancer (2014), Hypothyroidism, Migraines, and PONV (postoperative nausea and vomiting). here with    ICD-10-CM   1. Chronic migraine without aura, with intractable migraine, so stated, with status migrainosus  G43.711 Fremanezumab -vfrm (AJOVY ) 225 MG/1.5ML SOAJ       Limuel Nieblas is doing well on Ajovy  injections. Rizatriptan  works well for abortive therapy. We will continue current treatment plan. She may use ondansetron  for nausea. She will continue to focus on healthy lifestyle habits. Stress management encouraged. She will return for follow up in 1 year, sooner if needed.    No orders of the defined types were placed in this encounter.    Meds ordered this encounter  Medications   Fremanezumab -vfrm (AJOVY ) 225 MG/1.5ML SOAJ    Sig: INJECT 225 MG UNDER THE SKIN EVERY 30 DAYS    Dispense:  1.5 mL    Refill:  11    Supervising Provider:   AHERN, ANTONIA B [[id number]]   rizatriptan  (MAXALT -MLT) 10 MG disintegrating tablet    Sig: Take 1 tab at onset of migraine.  May repeat in 2 hrs, if needed.  Max dose: 2 tabs/day. This is a 30 day prescription.    Dispense:  9 tablet    Refill:  11    Dispense 9 or maximum allowed by insurance    Supervising Provider:   AHERN, ANTONIA B [[id number]]   ondansetron  (ZOFRAN -ODT) 4 MG disintegrating tablet    Sig: Take 1 tablet (4 mg total) by mouth every 8 (eight) hours as needed for nausea.    Dispense:  20 tablet    Refill:  1    Supervising Provider:   AHERN, ANTONIA B [[id number]]     Follow Up Instructions:  I discussed the assessment and treatment plan with the patient. The patient was provided an opportunity to ask questions and all were answered. The patient agreed with the plan and demonstrated an understanding of the instructions.    The patient was advised to call back or seek an in-person evaluation if the symptoms worsen or if the condition fails to improve as anticipated.  I provided 15 minutes of non-face-to-face time during this encounter. Patient located at their place of residence during Mychart visit. Provider is in the office.    Graydon Fofana, NP

## 2024-04-25 NOTE — Patient Instructions (Signed)
 Below is our plan:  We will continue Ajovy  every 30 days and rizatriptan  as needed.   Please make sure you are staying well hydrated. I recommend 50-60 ounces daily. Well balanced diet and regular exercise encouraged. Consistent sleep schedule with 6-8 hours recommended.   Please continue follow up with care team as directed.   Follow up with me in 1 year   You may receive a survey regarding today's visit. I encourage you to leave honest feed back as I do use this information to improve patient care. Thank you for seeing me today!   GENERAL HEADACHE INFORMATION:   Natural supplements: Magnesium Oxide or Magnesium Glycinate 500 mg at bed (up to 800 mg daily) Coenzyme Q10 300 mg in AM Vitamin B2- 200 mg twice a day   Add 1 supplement at a time since even natural supplements can have undesirable side effects. You can sometimes buy supplements cheaper (especially Coenzyme Q10) at www.WebmailGuide.co.za or at [facility].  Migraine with aura: There is increased risk for stroke in women with migraine with aura and a contraindication for the combined contraceptive pill for use by women who have migraine with aura. The risk for women with migraine without aura is lower. However other risk factors like smoking are far more likely to increase stroke risk than migraine. There is a recommendation for no smoking and for the use of OCPs without estrogen such as progestogen only pills particularly for women with migraine with aura.Aaron Aas People who have migraine headaches with auras may be 3 times more likely to have a stroke caused by a blood clot, compared to migraine patients who don't see auras. Women who take hormone-replacement therapy may be 30 percent more likely to suffer a clot-based stroke than women not taking medication containing estrogen. Other risk factors like smoking and high blood pressure may be  much more important.    Vitamins and herbs that show potential:   Magnesium: Magnesium (250 mg twice a day  or 500 mg at bed) has a relaxant effect on smooth muscles such as blood vessels. Individuals suffering from frequent or daily headache usually have low magnesium levels which can be increase with daily supplementation of 400-750 mg. Three trials found 40-90% average headache reduction  when used as a preventative. Magnesium may help with headaches are aura, the best evidence for magnesium is for migraine with aura is its thought to stop the cortical spreading depression we believe is the pathophysiology of migraine aura.Magnesium also demonstrated the benefit in menstrually related migraine.  Magnesium is part of the messenger system in the serotonin cascade and it is a good muscle relaxant.  It is also useful for constipation which can be a side effect of other medications used to treat migraine. Good sources include nuts, whole grains, and tomatoes. Side Effects: loose stool/diarrhea  Riboflavin (vitamin B 2) 200 mg twice a day. This vitamin assists nerve cells in the production of ATP a principal energy storing molecule.  It is necessary for many chemical reactions in the body.  There have been at least 3 clinical trials of riboflavin using 400 mg per day all of which suggested that migraine frequency can be decreased.  All 3 trials showed significant improvement in over half of migraine sufferers.  The supplement is found in bread, cereal, milk, meat, and poultry.  Most Americans get more riboflavin than the recommended daily allowance, however riboflavin deficiency is not necessary for the supplements to help prevent headache. Side effects: energizing, green urine  Coenzyme Q10: This is present in almost all cells in the body and is critical component for the conversion of energy.  Recent studies have shown that a nutritional supplement of CoQ10 can reduce the frequency of migraine attacks by improving the energy production of cells as with riboflavin.  Doses of 150 mg twice a day have been shown to be  effective.   Melatonin: Increasing evidence shows correlation between melatonin secretion and headache conditions.  Melatonin supplementation has decreased headache intensity and duration.  It is widely used as a sleep aid.  Sleep is natures way of dealing with migraine.  A dose of 3 mg is recommended to start for headaches including cluster headache. Higher doses up to 15 mg has been reviewed for use in Cluster headache and have been used. The rationale behind using melatonin for cluster is that many theories regarding the cause of Cluster headache center around the disruption of the normal circadian rhythm in the brain.  This helps restore the normal circadian rhythm.   HEADACHE DIET: Foods and beverages which may trigger migraine Note that only 20% of headache patients are food sensitive. You will know if you are food sensitive if you get a headache consistently 20 minutes to 2 hours after eating a certain food. Only cut out a food if it causes headaches, otherwise you might remove foods you enjoy! What matters most for diet is to eat a well balanced healthy diet full of vegetables and low fat protein, and to not miss meals.   Chocolate, other sweets ALL cheeses except cottage and cream cheese Dairy products, yogurt, sour cream, ice cream Liver Meat extracts (Bovril, Marmite, meat tenderizers) Meats or fish which have undergone aging, fermenting, pickling or smoking. These include: Hotdogs,salami,Lox,sausage, mortadellas,smoked salmon, pepperoni, Pickled herring Pods of broad bean (English beans, Chinese pea pods, [place] (fava) beans, lima and navy beans Ripe avocado, ripe banana Yeast extracts or active yeast preparations such as Brewer's or Fleishman's (commercial bakes goods are permitted) Tomato based foods, pizza (lasagna, etc.)   MSG (monosodium glutamate) is disguised as many things; look for these common aliases: Monopotassium glutamate Autolysed yeast Hydrolysed protein Sodium  caseinate "flavorings" "all natural preservatives" Nutrasweet   Avoid all other foods that convincingly provoke headaches.   Resources: The Dizzy Althia Jetty Your Headache Diet, migrainestrong.com  https://zamora-andrews.com/   Caffeine and Migraine For patients that have migraine, caffeine intake more than 3 days per week can lead to dependency and increased migraine frequency. I would recommend cutting back on your caffeine intake as best you can. The recommended amount of caffeine is 200-300 mg daily, although migraine patients may experience dependency at even lower doses. While you may notice an increase in headache temporarily, cutting back will be helpful for headaches in the long run. For more information on caffeine and migraine, visit: https://americanmigrainefoundation.org/resource-library/caffeine-and-migraine/   Headache Prevention Strategies:   1. Maintain a headache diary; learn to identify and avoid triggers.  - This can be a simple note where you log when you had a headache, associated symptoms, and medications used - There are several smartphone apps developed to help track migraines: Migraine Buddy, Migraine Monitor, Curelator N1-Headache App   Common triggers include: Emotional triggers: Emotional/Upset family or friends Emotional/Upset occupation Business reversal/success Anticipation anxiety Crisis-serious Post-crisis periodNew job/position   Physical triggers: Vacation Day Weekend Strenuous Exercise High Altitude Location New Move Menstrual Day Physical Illness Oversleep/Not enough sleep Weather changes Light: Photophobia or light sesnitivity treatment involves a balance between desensitization and reduction  in overly strong input. Use dark polarized glasses outside, but not inside. Avoid bright or fluorescent light, but do not dim environment to the point that going into a normally lit room hurts. Consider  FL-41 tint lenses, which reduce the most irritating wavelengths without blocking too much light.  These can be obtained at axonoptics.com or theraspecs.com Foods: see list above.   2. Limit use of acute treatments (over-the-counter medications, triptans, etc.) to no more than 2 days per week or 10 days per month to prevent medication overuse headache (rebound headache).     3. Follow a regular schedule (including weekends and holidays): Don't skip meals. Eat a balanced diet. 8 hours of sleep nightly. Minimize stress. Exercise 30 minutes per day. Being overweight is associated with a 5 times increased risk of chronic migraine. Keep well hydrated and drink 6-8 glasses of water per day.   4. Initiate non-pharmacologic measures at the earliest onset of your headache. Rest and quiet environment. Relax and reduce stress. Breathe2Relax is a free app that can instruct you on    some simple relaxtion and breathing techniques. Http://Dawnbuse.com is a    free website that provides teaching videos on relaxation.  Also, there are  many apps that   can be downloaded for "mindful" relaxation.  An app called YOGA NIDRA will help walk you through mindfulness. Another app called Calm can be downloaded to give you a structured mindfulness guide with daily reminders and skill development. Headspace for guided meditation Mindfulness Based Stress Reduction Online Course: www.palousemindfulness.com Cold compresses.   5. Don't wait!! Take the maximum allowable dosage of prescribed medication at the first sign of migraine.   6. Compliance:  Take prescribed medication regularly as directed and at the first sign of a migraine.   7. Communicate:  Call your physician when problems arise, especially if your headaches change, increase in frequency/severity, or become associated with neurological symptoms (weakness, numbness, slurred speech, etc.). Proceed to emergency room if you experience new or worsening symptoms or  symptoms do not resolve, if you have new neurologic symptoms or if headache is severe, or for any concerning symptom.   8. Headache/pain management therapies: Consider various complementary methods, including medication, behavioral therapy, psychological counselling, biofeedback, massage therapy, acupuncture, dry needling, and other modalities.  Such measures may reduce the need for medications. Counseling for pain management, where patients learn to function and ignore/minimize their pain, seems to work very well.   9. Recommend changing family's attention and focus away from patient's headaches. Instead, emphasize daily activities. If first question of day is 'How are your headaches/Do you have a headache today?', then patient will constantly think about headaches, thus making them worse. Goal is to re-direct attention away from headaches, toward daily activities and other distractions.   10. Helpful Websites: www.AmericanHeadacheSociety.org PatentHood.ch www.headaches.org TightMarket.nl www.achenet.org

## 2024-04-26 ENCOUNTER — Telehealth: Payer: BC Managed Care – PPO | Admitting: Family Medicine

## 2024-04-26 ENCOUNTER — Encounter: Payer: Self-pay | Admitting: Family Medicine

## 2024-04-26 DIAGNOSIS — G43711 Chronic migraine without aura, intractable, with status migrainosus: Secondary | ICD-10-CM | POA: Diagnosis not present

## 2024-04-26 MED ORDER — ONDANSETRON 4 MG PO TBDP: 4.0000 mg | ORAL_TABLET | Freq: Three times a day (TID) | ORAL | 1 refills | Status: AC | PRN

## 2024-04-26 MED ORDER — AJOVY 225 MG/1.5ML ~~LOC~~ SOAJ: SUBCUTANEOUS | 11 refills | Status: AC

## 2024-04-26 MED ORDER — RIZATRIPTAN BENZOATE 10 MG PO TBDP: ORAL_TABLET | ORAL | 11 refills | Status: AC

## 2024-05-18 ENCOUNTER — Ambulatory Visit: Admitting: Family

## 2024-05-18 ENCOUNTER — Encounter: Payer: Self-pay | Admitting: Family

## 2024-05-18 VITALS — BP 109/59 | HR 69 | Temp 99.0°F | Resp 16 | Ht 66.5 in | Wt 213.6 lb

## 2024-05-18 DIAGNOSIS — Z0000 Encounter for general adult medical examination without abnormal findings: Secondary | ICD-10-CM | POA: Diagnosis not present

## 2024-05-18 DIAGNOSIS — G43809 Other migraine, not intractable, without status migrainosus: Secondary | ICD-10-CM

## 2024-05-18 DIAGNOSIS — E039 Hypothyroidism, unspecified: Secondary | ICD-10-CM | POA: Diagnosis not present

## 2024-05-18 DIAGNOSIS — R739 Hyperglycemia, unspecified: Secondary | ICD-10-CM

## 2024-05-18 DIAGNOSIS — K219 Gastro-esophageal reflux disease without esophagitis: Secondary | ICD-10-CM | POA: Diagnosis not present

## 2024-05-18 DIAGNOSIS — K8681 Exocrine pancreatic insufficiency: Secondary | ICD-10-CM

## 2024-05-18 LAB — COMPREHENSIVE METABOLIC PANEL WITH GFR
ALT: 16 U/L (ref 0–35)
AST: 15 U/L (ref 0–37)
Albumin: 4.7 g/dL (ref 3.5–5.2)
Alkaline Phosphatase: 74 U/L (ref 39–117)
BUN: 11 mg/dL (ref 6–23)
CO2: 31 meq/L (ref 19–32)
Calcium: 9.6 mg/dL (ref 8.4–10.5)
Chloride: 99 meq/L (ref 96–112)
Creatinine, Ser: 0.65 mg/dL (ref 0.40–1.20)
GFR: 97.63 mL/min (ref >60.00)
Glucose, Bld: 86 mg/dL (ref 70–99)
Potassium: 4.7 meq/L (ref 3.5–5.1)
Sodium: 137 meq/L (ref 135–145)
Total Bilirubin: 0.4 mg/dL (ref 0.2–1.2)
Total Protein: 7.2 g/dL (ref 6.0–8.3)

## 2024-05-18 LAB — HEMOGLOBIN A1C: Hgb A1c MFr Bld: 5.6 % (ref 4.6–6.5)

## 2024-05-18 LAB — LIPID PANEL
Cholesterol: 302 mg/dL — ABNORMAL HIGH (ref 0–200)
HDL: 71.1 mg/dL (ref >39.00)
LDL Cholesterol: 207 mg/dL — ABNORMAL HIGH (ref 0–99)
NonHDL: 231.38
Total CHOL/HDL Ratio: 4
Triglycerides: 122 mg/dL (ref 0.0–149.0)
VLDL: 24.4 mg/dL (ref 0.0–40.0)

## 2024-05-18 LAB — TSH: TSH: 1.18 u[IU]/mL (ref 0.35–5.50)

## 2024-05-18 NOTE — Assessment & Plan Note (Signed)
 Annual wellness visit completed. Immunizations reviewed. Discussed COVID vaccine availability. Declined Prevnar, hepatitis B, and shingles vaccines. No acute concerns. Family history of dementia noted. Work-related stress discussed.  - Order baseline cholesterol level.  - Order fasting blood sugar test.  - Encourage lifestyle modifications for potential prediabetes.  - Continue Synthroid , Maxalt , Ajovy , omeprazole. Obesity Challenges with diet and exercise. Emotional eating noted. Concerned about weight loss medication coverage and GI side effects.  - Encourage regular physical activity, such as 30-minute walks.  - Discuss lifestyle modifications for weight management.

## 2024-05-18 NOTE — Assessment & Plan Note (Signed)
Stable on synthroid.  Check TSH.

## 2024-05-18 NOTE — Assessment & Plan Note (Signed)
 Managed with creon by GI.

## 2024-05-18 NOTE — Assessment & Plan Note (Signed)
 Stable on prilosec.

## 2024-05-18 NOTE — Patient Instructions (Signed)
 VISIT SUMMARY:  Today, you had your annual physical exam. We reviewed your immunizations and discussed the availability of the COVID vaccine. You declined the Prevnar, hepatitis B, and shingles vaccines. We talked about your family history of dementia and work-related stress. We also discussed your gastrointestinal issues, vision problems, migraines, hypothyroidism, and weight management.  YOUR PLAN:  -ADULT WELLNESS VISIT: Your annual wellness visit was completed, and we reviewed your immunizations. You declined the Prevnar, hepatitis B, and shingles vaccines. We discussed your family history of dementia and work-related stress. We will order a baseline cholesterol level and a fasting blood sugar test. Continue taking Synthroid , Maxalt , Ajovy , and omeprazole as prescribed. Lifestyle modifications are encouraged to manage potential prediabetes.  -OBESITY: Obesity means having an excessive amount of body fat. We discussed the challenges you face with diet and exercise, including emotional eating. Regular physical activity, such as 30-minute walks, is encouraged. We also discussed lifestyle modifications for weight management.  -HYPOTHYROIDISM: Hypothyroidism is when your thyroid  gland doesn't produce enough thyroid  hormone. Your condition is managed with Synthroid , and previous thyroid  function tests have been normal. We will order a thyroid  function test and continue your current dose of Synthroid , adjusting if necessary.  -MIGRAINE: Migraines are severe headaches often accompanied by nausea and sensitivity to light. Your migraines are infrequent and have improved post-menopause. They are managed with Ajovy  injections and Maxalt  as needed. Continue with your current treatment plan.  -GASTROESOPHAGEAL REFLUX DISEASE (GERD): GERD is a condition where stomach acid frequently flows back into the tube connecting your mouth and stomach. Your GERD is managed effectively with prescription omeprazole. Continue  taking omeprazole as prescribed.  -CHRONIC DIARRHEA, IMPROVED WITH CREON: Chronic diarrhea is frequent loose, watery stools. Your condition has significantly improved with Creon, although you still experience some loose stools and urgency. Continue taking Creon as prescribed.  -VISUAL IMPAIRMENT, STATUS POST MULTIPLE EYE SURGERIES: Visual impairment means having difficulty seeing. You manage your vision with a contact lens in one eye and readers for close vision. You have declined further cataract surgery. Continue with your current vision management plan.  INSTRUCTIONS:  We will order a baseline cholesterol level and a fasting blood sugar test. Please continue taking Synthroid , Maxalt , Ajovy , and omeprazole as prescribed. Regular physical activity, such as 30-minute walks, is encouraged. We will also order a thyroid  function test and adjust your Synthroid  dose if necessary.

## 2024-05-18 NOTE — Progress Notes (Signed)
 Subjective:     Patient ID: [name], female    DOB: 07-08-1966, 58 y.o.   MRN: [id number]  Chief Complaint  Patient presents with   Annual Exam    HPI  Discussed the use of AI scribe software for clinical note transcription with the patient, who gave verbal consent to proceed.  History of Present Illness  [name] is a 58 year old female who presents for an annual physical exam. She manages gastrointestinal issues with Creon, experiencing loose stools and urgency, and uses omeprazole for reflux. She has undergone three eye surgeries, uses a contact lens in one eye, and has an implant in the other. She has difficulty with depth perception and requires readers for close vision. She has a cataract in one eye but is hesitant about further surgery. Migraines occur infrequently and are managed with Ajovy  injections, with improvement noted post-menopause. Morning blood sugar levels are around 110-112 mg/dL. She takes Synthroid  for hypothyroidism, with previous thyroid  function tests normal. Her mother, aged 16, has dementia and lives independently. She has no current cough, cold symptoms, skin concerns, hearing issues, unusual muscle or joint pain, urinary concerns, constipation, or frequent headaches. Vision is poor, requiring corrective measures. No concerns about depression or anxiety.   Immunizations: declines flu shot, hep B, Shingrix and Prevnar Diet: needs improvement Exercise: needs improvement Colonoscopy: 2023 Pap Smear: hysterectomy Mammogram: s/p bilateral mastectomy Vision: up to date Dental: up to date       Health Maintenance Due  Topic Date Due   COVID-19 Vaccine (1) Never done    Past Medical History:  Diagnosis Date   Abrasion of right arm 12/25/2016   Complication of anesthesia    panic attack while waking up   Detached retina, right 08/25/2018   History of breast cancer 2014   Hypothyroidism    Migraines    PONV (postoperative nausea and  vomiting)     Past Surgical History:  Procedure Laterality Date   BREAST LUMPECTOMY WITH NEEDLE LOCALIZATION Left 06/20/2013   Procedure: BREAST LUMPECTOMY WITH NEEDLE LOCALIZATION;  Surgeon: Deward GORMAN Curvin DOUGLAS, MD;  Location: [facility];  Service: General;  Laterality: Left;   BREAST RECONSTRUCTION Left 04/09/2017   Procedure: REVISION BREAST RECONSTRUCTION;  Surgeon: Lowery Estefana GORMAN, DO;  Location: [facility];  Service: Plastics;  Laterality: Left;   CATARACT EXTRACTION Right 03/22/2019   LAPAROSCOPIC VAGINAL HYSTERECTOMY WITH SALPINGO OOPHORECTOMY Bilateral 06/17/2016   Procedure: LAPAROSCOPIC ASSISTED VAGINAL HYSTERECTOMY WITH SALPINGO OOPHORECTOMY;  Surgeon: Norleen Skill, MD;  Location: [facility];  Service: Gynecology;  Laterality: Bilateral;  need bed   LIPOSUCTION WITH LIPOFILLING Left 04/09/2017   Procedure: LIPOFILLING TO THE LEFT UPPER BREAST TO IMPROVE SYMMETRY.;  Surgeon: Lowery Estefana GORMAN, DO;  Location: [facility];  Service: Plastics;  Laterality: Left;   MASTECTOMY W/ SENTINEL NODE BIOPSY Bilateral 09/08/2016   Procedure: LEFT MASTECTOMY WITH LEFT SENTINEL LYMPH NODE BIOPSY, RIGHT PROPHYLACTIC MASTECTOMY;  Surgeon: Deward Curvin III, MD;  Location: [facility];  Service: General;  Laterality: Bilateral;   PALATE TO GINGIVA GRAFT     REMOVAL OF BILATERAL TISSUE EXPANDERS WITH PLACEMENT OF BILATERAL BREAST IMPLANTS Bilateral 01/01/2017   Procedure: REMOVAL OF BILATERAL TISSUE EXPANDERS WITH PLACEMENT OF BILATERAL SILICONE BREAST IMPLANTS WITH LIPOSUCTION;  Surgeon: Lowery Estefana GORMAN, DO;  Location: [facility];  Service: Plastics;  Laterality: Bilateral;   RETINAL DETACHMENT SURGERY Right 08/25/2018   TISSUE EXPANDER PLACEMENT Bilateral 09/08/2016  Procedure: IMMEDIATE PLACEMENT OF BILATERAL TISSUE EXPANDERS AFTER MASTECTOMIES;  Surgeon: Estefana RAMAN Dillingham, DO;  Location: [facility];  Service: Plastics;  Laterality:  Bilateral;    Family History  Problem Relation Age of Onset   Breast cancer Mother 57   Heart disease Mother        Atrial fibrillation   Dementia Mother    Melanoma Father 46   COPD Father    Arthritis Father    Dementia Father    Pneumonia Father    Breast cancer Sister 62       ER+/her2-; onco score = 8   Cancer Maternal Aunt        oral cancer; smoker   Stomach cancer Maternal Uncle    Head & neck cancer Paternal Uncle        smoker   Breast cancer Cousin        dx in her late 52s-60s    Social History   Socioeconomic History   Marital status: Married    Spouse name: Chyrl   Number of children: 0   Years of education: Masters   [employer] education level: Master's degree (e.g., MA, MS, MEng, MEd, MSW, MBA)  Occupational History   Occupation: Medical Records  Tobacco Use   Smoking status: Never   Smokeless tobacco: Never  Vaping Use   Vaping status: Never Used  Substance and Sexual Activity   Alcohol use: Yes    Alcohol/week: 0.0 standard drinks of alcohol    Comment: occasionally    Drug use: No   Sexual activity: Yes  Other Topics Concern   Not on file  Social History Narrative   Lives at home with husband.   Right-handed.   1 cups caffeine daily.   Works as a Chemical engineer and human resources   Has a step daughter/grandson   One dog   Social Drivers of Corporate investment banker Strain: Low Risk  (05/16/2024)   Overall Financial Resource Strain (CARDIA)    Difficulty of Paying Living Expenses: Not hard at all  Food Insecurity: No Food Insecurity (05/16/2024)   Hunger Vital Sign    Worried About Running Out of Food in the Last Year: Never true    Ran Out of Food in the Last Year: Never true  Transportation Needs: No Transportation Needs (05/16/2024)   PRAPARE - Administrator, Civil Service (Medical): No    Lack of Transportation (Non-Medical): No  Physical Activity: Inactive (05/16/2024)    Exercise Vital Sign    Days of Exercise per Week: 0 days    Minutes of Exercise per Session: Not on file  Stress: No Stress Concern Present (05/16/2024)   [employer] of Occupational Health - Occupational Stress Questionnaire    Feeling of Stress: Only a little  Social Connections: Socially Integrated (05/16/2024)   Social Connection and Isolation Panel    Frequency of Communication with Friends and Family: More than three times a week    Frequency of Social Gatherings with Friends and Family: More than three times a week    Attends Religious Services: More than 4 times per year    Active Member of [employer] or Organizations: Yes    Attends Engineer, structural: More than 4 times per year    Marital Status: Married  Catering manager Violence: Not on file    Outpatient Medications Prior to Visit  Medication Sig Dispense Refill   CREON 12000-38000 units CPEP capsule  Take 3 capsules by mouth 3 (three) times daily. + 36,000 units with 1 snack     dicyclomine  (BENTYL ) 20 MG tablet Take 1 tablet (20 mg total) by mouth 4 (four) times daily -  before meals and at bedtime.     Fremanezumab -vfrm (AJOVY ) 225 MG/1.5ML SOAJ INJECT 225 MG UNDER THE SKIN EVERY 30 DAYS 1.5 mL 11   levothyroxine  (SYNTHROID ) 100 MCG tablet TAKE 1 TABLET (100 MCG TOTAL) BY MOUTH DAILY BEFORE BREAKFAST. 90 tablet 1   MELATONIN PO Take 30 mg by mouth at bedtime.     omeprazole (PRILOSEC) 20 MG capsule Take 20 mg by mouth daily. At night     ondansetron  (ZOFRAN -ODT) 4 MG disintegrating tablet Take 1 tablet (4 mg total) by mouth every 8 (eight) hours as needed for nausea. 20 tablet 1   rizatriptan  (MAXALT -MLT) 10 MG disintegrating tablet Take 1 tab at onset of migraine.  May repeat in 2 hrs, if needed.  Max dose: 2 tabs/day. This is a 30 day prescription. 9 tablet 11   No facility-administered medications prior to visit.    Allergies  Allergen Reactions   Penicillins Rash    Review of Systems   Constitutional:  Negative for weight loss.  HENT:  Negative for congestion and hearing loss.   Eyes:  Positive for blurred vision (wears one contact).  Respiratory:  Negative for cough.   Cardiovascular:  Negative for leg swelling.  Gastrointestinal:  Positive for diarrhea (some diarrea, takes creon which helps some). Negative for constipation.  Genitourinary:  Negative for dysuria and frequency.  Musculoskeletal:  Negative for joint pain and myalgias.  Skin:  Negative for rash.  Neurological:  Negative for headaches.  Psychiatric/Behavioral:  Negative for depression. The patient is not nervous/anxious.        Objective:    Physical Exam   BP (!) 109/59 (BP Location: Right Arm, Patient Position: Sitting, Cuff Size: Large)   Pulse 69   Temp 99 F (37.2 C) (Oral)   Resp 16   Ht 5' 6.5 (1.689 m)   Wt 213 lb 9.6 oz (96.9 kg)   LMP 02/01/2014   SpO2 100%   BMI 33.96 kg/m  Wt Readings from Last 3 Encounters:  05/18/24 213 lb 9.6 oz (96.9 kg)  11/20/23 206 lb (93.4 kg)  09/25/23 204 lb 1.9 oz (92.6 kg)  Physical Exam  Constitutional: She is oriented to person, place, and time. She appears well-developed and well-nourished. No distress.  HENT:  Head: Normocephalic and atraumatic.  Right Ear: Tympanic membrane and ear canal normal.  Left Ear: Tympanic membrane and ear canal normal.  Mouth/Throat: Oropharynx is clear and moist.  Eyes: Pupils are equal, round, and reactive to light. No scleral icterus.  Neck: Normal range of motion. No thyromegaly present.  Cardiovascular: Normal rate and regular rhythm.   No murmur heard. Pulmonary/Chest: Effort normal and breath sounds normal. No respiratory distress. He has no wheezes. She has no rales. She exhibits no tenderness.  Abdominal: Soft. Bowel sounds are normal. She exhibits no distension and no mass. There is no tenderness. There is no rebound and no guarding.  Musculoskeletal: She exhibits no edema.  Lymphadenopathy:    She  has no cervical adenopathy.  Neurological: She is alert and oriented to person, place, and time. She has normal patellar reflexes. She exhibits normal muscle tone. Coordination normal.  Skin: Skin is warm and dry.  Psychiatric: She has a normal mood and affect. Her behavior is normal. Judgment and  thought content normal.  Breast/Pelvic: deferred            Assessment & Plan:        Assessment & Plan:   Problem List Items Addressed This Visit       Unprioritized   Preventative health care   Annual wellness visit completed. Immunizations reviewed. Discussed COVID vaccine availability. Declined Prevnar, hepatitis B, and shingles vaccines. No acute concerns. Family history of dementia noted. Work-related stress discussed.  - Order baseline cholesterol level.  - Order fasting blood sugar test.  - Encourage lifestyle modifications for potential prediabetes.  - Continue Synthroid , Maxalt , Ajovy , omeprazole. Obesity Challenges with diet and exercise. Emotional eating noted. Concerned about weight loss medication coverage and GI side effects.  - Encourage regular physical activity, such as 30-minute walks.  - Discuss lifestyle modifications for weight management.       Relevant Orders   Comp Met (CMET)   Lipid panel   Migraine   Stable on Ajovy  and Maxalt  which are managed by neurology.       Hypothyroidism - Primary   Stable on synthroid .  Check TSH.      Relevant Orders   TSH   Gastroesophageal reflux disease   Stable on prilosec.       Exocrine pancreatic insufficiency   Managed with creon by GI.       Other Visit Diagnoses       Hyperglycemia       Relevant Orders   Comp Met (CMET)   Lipid panel   HgB A1c       I am having Natalija R. Anastacio maintain her MELATONIN PO, omeprazole, Creon, dicyclomine , levothyroxine , Ajovy , rizatriptan , and ondansetron .  No orders of the defined types were placed in this encounter.

## 2024-05-18 NOTE — Assessment & Plan Note (Signed)
 Stable on Ajovy  and Maxalt  which are managed by neurology.

## 2024-05-19 ENCOUNTER — Ambulatory Visit: Payer: Self-pay | Admitting: Family

## 2024-05-19 DIAGNOSIS — E785 Hyperlipidemia, unspecified: Secondary | ICD-10-CM

## 2024-08-24 ENCOUNTER — Encounter

## 2024-08-24 ENCOUNTER — Telehealth: Admitting: Physician Assistant

## 2024-08-24 ENCOUNTER — Other Ambulatory Visit: Payer: Self-pay | Admitting: Family

## 2024-08-24 DIAGNOSIS — J069 Acute upper respiratory infection, unspecified: Secondary | ICD-10-CM

## 2024-08-24 DIAGNOSIS — B9689 Other specified bacterial agents as the cause of diseases classified elsewhere: Secondary | ICD-10-CM | POA: Diagnosis not present

## 2024-08-24 DIAGNOSIS — E039 Hypothyroidism, unspecified: Secondary | ICD-10-CM

## 2024-08-24 MED ORDER — DOXYCYCLINE HYCLATE 100 MG PO TABS: 100.0000 mg | ORAL_TABLET | Freq: Two times a day (BID) | ORAL | 0 refills | Status: AC

## 2024-08-24 MED ORDER — PROMETHAZINE-DM 6.25-15 MG/5ML PO SYRP: 5.0000 mL | ORAL_SOLUTION | Freq: Four times a day (QID) | ORAL | 0 refills | Status: AC | PRN

## 2024-08-24 MED ORDER — FLUTICASONE PROPIONATE 50 MCG/ACT NA SUSP: 2.0000 | Freq: Every day | NASAL | 0 refills | Status: AC

## 2024-08-24 NOTE — Progress Notes (Signed)
 " Virtual Visit Consent   [name], you are scheduled for a virtual visit with a  provider today. Just as with appointments in the office, your consent must be obtained to participate. Your consent will be active for this visit and any virtual visit you may have with one of our providers in the next 365 days. If you have a MyChart account, a copy of this consent can be sent to you electronically.  As this is a virtual visit, video technology does not allow for your provider to perform a traditional examination. This may limit your provider's ability to fully assess your condition. If your provider identifies any concerns that need to be evaluated in person or the need to arrange testing (such as labs, EKG, etc.), we will make arrangements to do so. Although advances in technology are sophisticated, we cannot ensure that it will always work on either your end or our end. If the connection with a video visit is poor, the visit may have to be switched to a telephone visit. With either a video or telephone visit, we are not always able to ensure that we have a secure connection.  By engaging in this virtual visit, you consent to the provision of healthcare and authorize for your insurance to be billed (if applicable) for the services provided during this visit. Depending on your insurance coverage, you may receive a charge related to this service.  I need to obtain your verbal consent now. Are you willing to proceed with your visit today? [name] has provided verbal consent on 08/24/2024 for a virtual visit (video or telephone). Stephanie CHRISTELLA Dickinson, PA-C  Date: 08/24/2024 2:03 PM   Virtual Visit via Video Note   I, [name], connected with  [name]  ([id number], January 02, 1966) on 08/24/24 at  2:45 PM EST by a video-enabled telemedicine application and verified that I am speaking with the correct person using two identifiers.  Location: Patient: Virtual Visit  Location Patient: Home Provider: Virtual Visit Location Provider: Home Office   I discussed the limitations of evaluation and management by telemedicine and the availability of in person appointments. The patient expressed understanding and agreed to proceed.    History of Present Illness: [name] is a 59 y.o. who identifies as a female who was assigned female at birth, and is being seen today for cough and congestion.  HPI: URI  This is a new problem. The current episode started 1 to 4 weeks ago (Over one week, 08/16/24). The problem has been gradually worsening. The maximum temperature recorded prior to her arrival was 100.4 - 100.9 F (100.4, last night). The fever has been present for Less than 1 day. Associated symptoms include chest pain (heaviness), congestion, coughing, diarrhea (chronic issue), ear pain (intermittent), headaches, rhinorrhea, sinus pain and a sore throat (initial symptom,). Pertinent negatives include no nausea, plugged ear sensation, vomiting or wheezing. Associated symptoms comments: Hoarse voice, DOE with movements and talking, decreased appetite. Treatments tried: Mucinex, tea with honey and lemon, throat coat, vicks vaporub, steam. The treatment provided no relief.     Problems:  Patient Active Problem List   Diagnosis Date Noted   Preventative health care 05/18/2024   Exocrine pancreatic insufficiency 05/22/2023   Irritable bowel syndrome with diarrhea 05/22/2023   Chronic migraine without aura, with intractable migraine, so stated, with status migrainosus 09/23/2018   S/P mastectomy, bilateral 07/06/2018   S/P breast reconstruction, bilateral 07/06/2018   Gastroesophageal reflux disease  12/31/2017   Hypothyroidism 12/31/2017   Depression 12/31/2017   Other osteoporosis without current pathological fracture 01/29/2017   Acquired absence of bilateral breasts and nipples 09/23/2016   Breast cancer (HCC) 09/08/2016   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  Resting comfortably at home.  Head is normocephalic, atraumatic.  No labored breathing.  Speech is clear and coherent with logical content. Patient able to speak in complete sentences. Patient is alert and oriented at baseline.  Harsh, wet cough heard a few times during the call but did not affect speech  Assessment and Plan: 1. Bacterial upper respiratory infection (Primary) - doxycycline  (VIBRA -TABS) 100 MG tablet; Take 1 tablet (100 mg total) by mouth 2 (two) times daily.  Dispense: 14 tablet; Refill: 0 - promethazine -dextromethorphan (PROMETHAZINE -DM) 6.25-15 MG/5ML syrup; Take 5 mLs by mouth 4 (four) times daily as needed.  Dispense: 118 mL; Refill: 0 - fluticasone  (FLONASE ) 50 MCG/ACT nasal spray; Place 2 sprays into both nostrils daily.  Dispense: 16 g; Refill: 0  - Worsening over a week despite OTC medications - Will treat with Doxycycline  and Promethazine  DM - Can continue Mucinex (PLAIN) during the daytime as needed - Flonase  added for nasal congestion and drainage - Push fluids.  - Rest.  - Steam and humidifier can help - Seek in person evaluation if worsening or symptoms fail to improve    Follow Up Instructions: I discussed the assessment and treatment plan with the patient. The patient was provided an opportunity to ask questions and all were answered. The patient agreed with the plan and demonstrated an understanding of the instructions.  A copy of instructions were sent to the patient via MyChart unless otherwise noted below.    The patient was advised to call back or seek an in-person evaluation if the  symptoms worsen or if the condition fails to improve as anticipated.    Stephanie CHRISTELLA Dickinson, PA-C     [1]  Allergies Allergen Reactions   Penicillins Rash  [2]  Current Outpatient Medications:    doxycycline  (VIBRA -TABS) 100 MG tablet, Take 1 tablet (100 mg total) by mouth 2 (two) times daily., Disp: 14 tablet, Rfl: 0   fluticasone  (FLONASE ) 50 MCG/ACT nasal spray, Place 2 sprays into both nostrils daily., Disp: 16 g, Rfl: 0   promethazine -dextromethorphan (PROMETHAZINE -DM) 6.25-15 MG/5ML syrup, Take 5 mLs by mouth 4 (four) times daily as needed., Disp: 118 mL, Rfl: 0   CREON 12000-38000 units CPEP capsule, Take 3 capsules by mouth 3 (three) times daily. + 36,000 units with 1 snack, Disp: , Rfl:    dicyclomine  (BENTYL ) 20 MG tablet, Take 1 tablet (20 mg total) by mouth 4 (four) times daily -  before meals and at bedtime., Disp: , Rfl:    Fremanezumab -vfrm (AJOVY ) 225 MG/1.5ML SOAJ, INJECT 225 MG UNDER THE SKIN EVERY 30 DAYS, Disp: 1.5 mL, Rfl: 11   levothyroxine  (SYNTHROID ) 100 MCG tablet, TAKE 1 TABLET (100 MCG TOTAL) BY MOUTH DAILY BEFORE BREAKFAST., Disp: 90 tablet, Rfl: 1   MELATONIN PO, Take 30 mg by mouth at bedtime., Disp: , Rfl:    omeprazole (PRILOSEC) 20 MG capsule, Take 20 mg by mouth daily. At night, Disp: , Rfl:  ondansetron  (ZOFRAN -ODT) 4 MG disintegrating tablet, Take 1 tablet (4 mg total) by mouth every 8 (eight) hours as needed for nausea., Disp: 20 tablet, Rfl: 1   rizatriptan  (MAXALT -MLT) 10 MG disintegrating tablet, Take 1 tab at onset of migraine.  May repeat in 2 hrs, if needed.  Max dose: 2 tabs/day. This is a 30 day prescription., Disp: 9 tablet, Rfl: 11  "

## 2024-08-24 NOTE — Patient Instructions (Signed)
 " [name], thank you for joining Stephanie CHRISTELLA Dickinson, PA-C for today's virtual visit.  While this provider is not your primary care provider (PCP), if your PCP is located in our provider database this encounter information will be shared with them immediately following your visit.   A [facility] MyChart account gives you access to today's visit and all your visits, tests, and labs performed at [facility]  click here if you don't have a [facility] MyChart account or go to [email or web address]  Consent: (Patient) [name] provided verbal consent for this virtual visit at the beginning of the encounter.  Current Medications:  Current Outpatient Medications:    doxycycline  (VIBRA -TABS) 100 MG tablet, Take 1 tablet (100 mg total) by mouth 2 (two) times daily., Disp: 14 tablet, Rfl: 0   fluticasone  (FLONASE ) 50 MCG/ACT nasal spray, Place 2 sprays into both nostrils daily., Disp: 16 g, Rfl: 0   promethazine -dextromethorphan (PROMETHAZINE -DM) 6.25-15 MG/5ML syrup, Take 5 mLs by mouth 4 (four) times daily as needed., Disp: 118 mL, Rfl: 0   CREON 12000-38000 units CPEP capsule, Take 3 capsules by mouth 3 (three) times daily. + 36,000 units with 1 snack, Disp: , Rfl:    dicyclomine  (BENTYL ) 20 MG tablet, Take 1 tablet (20 mg total) by mouth 4 (four) times daily -  before meals and at bedtime., Disp: , Rfl:    Fremanezumab -vfrm (AJOVY ) 225 MG/1.5ML SOAJ, INJECT 225 MG UNDER THE SKIN EVERY 30 DAYS, Disp: 1.5 mL, Rfl: 11   levothyroxine  (SYNTHROID ) 100 MCG tablet, TAKE 1 TABLET (100 MCG TOTAL) BY MOUTH DAILY BEFORE BREAKFAST., Disp: 90 tablet, Rfl: 1   MELATONIN PO, Take 30 mg by mouth at bedtime., Disp: , Rfl:    omeprazole (PRILOSEC) 20 MG capsule, Take 20 mg by mouth daily. At night, Disp: , Rfl:    ondansetron  (ZOFRAN -ODT) 4 MG disintegrating tablet, Take 1 tablet (4 mg total) by mouth every 8 (eight) hours as needed for nausea., Disp: 20 tablet, Rfl: 1   rizatriptan   (MAXALT -MLT) 10 MG disintegrating tablet, Take 1 tab at onset of migraine.  May repeat in 2 hrs, if needed.  Max dose: 2 tabs/day. This is a 30 day prescription., Disp: 9 tablet, Rfl: 11   Medications ordered in this encounter:  Meds ordered this encounter  Medications   doxycycline  (VIBRA -TABS) 100 MG tablet    Sig: Take 1 tablet (100 mg total) by mouth 2 (two) times daily.    Dispense:  14 tablet    Refill:  0    Supervising Provider:   LAMPTEY, PHILIP O [[id number]]   promethazine -dextromethorphan (PROMETHAZINE -DM) 6.25-15 MG/5ML syrup    Sig: Take 5 mLs by mouth 4 (four) times daily as needed.    Dispense:  118 mL    Refill:  0    Supervising Provider:   LAMPTEY, PHILIP O [id number]   fluticasone  (FLONASE ) 50 MCG/ACT nasal spray    Sig: Place 2 sprays into both nostrils daily.    Dispense:  16 g    Refill:  0    Supervising Provider:   BLAISE ALEENE KIDD [[id number]]     *If you need refills on other medications prior to your next appointment, please contact your pharmacy*  Follow-Up: Call back or seek an in-person evaluation if the symptoms worsen or if the condition fails to improve as anticipated.  [facility] [phone]  Other Instructions Acute Bronchitis, Adult  Acute bronchitis is sudden inflammation of the main airways (  bronchi) that come off the windpipe (trachea) in the lungs. The swelling causes the airways to get smaller and make more mucus than normal. This can make it hard to breathe and can cause coughing or noisy breathing (wheezing). Acute bronchitis may last several weeks. The cough may last longer. Allergies, asthma, and exposure to smoke may make the condition worse. What are the causes? This condition can be caused by germs and by substances that irritate the lungs, including: Cold and flu viruses. The most common cause of this condition is the virus that causes the common cold. Bacteria. This is less common. Breathing in substances that  irritate the lungs, including: Smoke from cigarettes and other forms of tobacco. Dust and pollen. Fumes from household cleaning products, gases, or burned fuel. Indoor or outdoor air pollution. What increases the risk? The following factors may make you more likely to develop this condition: A weak body's defense system, also called the immune system. A condition that affects your lungs and breathing, such as asthma. What are the signs or symptoms? Common symptoms of this condition include: Coughing. This may bring up clear, yellow, or green mucus from your lungs (sputum). Wheezing. Runny or stuffy nose. Having too much mucus in your lungs (chest congestion). Shortness of breath. Aches and pains, including sore throat or chest. How is this diagnosed? This condition is usually diagnosed based on: Your symptoms and medical history. A physical exam. You may also have other tests, including tests to rule out other conditions, such as pneumonia. These tests include: A test of lung function. Test of a mucus sample to look for the presence of bacteria. Tests to check the oxygen level in your blood. Blood tests. Chest X-ray. How is this treated? Most cases of acute bronchitis clear up over time without treatment. Your health care provider may recommend: Drinking more fluids to help thin your mucus so it is easier to cough up. Taking inhaled medicine (inhaler) to improve air flow in and out of your lungs. Using a vaporizer or a humidifier. These are machines that add water  to the air to help you breathe better. Taking a medicine that thins mucus and clears congestion (expectorant). Taking a medicine that prevents or stops coughing (cough suppressant). It is not common to take an antibiotic medicine for this condition. Follow these instructions at home:  Take over-the-counter and prescription medicines only as told by your health care provider. Use an inhaler, vaporizer, or humidifier as  told by your health care provider. Take two teaspoons (10 mL) of honey at bedtime to lessen coughing at night. Drink enough fluid to keep your urine pale yellow. Do not use any products that contain nicotine or tobacco. These products include cigarettes, chewing tobacco, and vaping devices, such as e-cigarettes. If you need help quitting, ask your health care provider. Get plenty of rest. Return to your normal activities as told by your health care provider. Ask your health care provider what activities are safe for you. Keep all follow-up visits. This is important. How is this prevented? To lower your risk of getting this condition again: Wash your hands often with soap and water  for at least 20 seconds. If soap and water  are not available, use hand sanitizer. Avoid contact with people who have cold symptoms. Try not to touch your mouth, nose, or eyes with your hands. Avoid breathing in smoke or chemical fumes. Breathing smoke or chemical fumes will make your condition worse. Get the flu shot every year. Contact a health  care provider if: Your symptoms do not improve after 2 weeks. You have trouble coughing up the mucus. Your cough keeps you awake at night. You have a fever. Get help right away if you: Cough up blood. Feel pain in your chest. Have severe shortness of breath. Faint or keep feeling like you are going to faint. Have a severe headache. Have a fever or chills that get worse. These symptoms may represent a serious problem that is an emergency. Do not wait to see if the symptoms will go away. Get medical help right away. Call your local emergency services (911 in the [place] Do not drive yourself to the hospital. Summary Acute bronchitis is inflammation of the main airways (bronchi) that come off the windpipe (trachea) in the lungs. The swelling causes the airways to get smaller and make more mucus than normal. Drinking more fluids can help thin your mucus so it is easier to  cough up. Take over-the-counter and prescription medicines only as told by your health care provider. Do not use any products that contain nicotine or tobacco. These products include cigarettes, chewing tobacco, and vaping devices, such as e-cigarettes. If you need help quitting, ask your health care provider. Contact a health care provider if your symptoms do not improve after 2 weeks. This information is not intended to replace advice given to you by your health care provider. Make sure you discuss any questions you have with your health care provider. Document Revised: 10/24/2021 Document Reviewed: 11/14/2020 [employer] Patient Education  2024 [employer]   If you have been instructed to have an in-person evaluation today at a local Urgent Care facility, please use the link below. It will take you to a list of all of our available [facility], including address, phone number and hours of operation. Please do not delay care.  [facility]  If you or a family member do not have a primary care provider, use the link below to schedule a visit and establish care. When you choose a [facility] primary care physician or advanced practice provider, you gain a long-term partner in health. Find a Primary Care Provider  Learn more about [facility] in-office and virtual care options: [facility] Get Care Now "

## 2024-09-23 ENCOUNTER — Ambulatory Visit: Payer: 59 | Admitting: Hematology & Oncology

## 2024-09-23 ENCOUNTER — Inpatient Hospital Stay: Payer: 59

## 2024-11-07 ENCOUNTER — Other Ambulatory Visit

## 2025-05-01 ENCOUNTER — Telehealth: Admitting: Family Medicine

## 2025-05-19 ENCOUNTER — Encounter: Admitting: Family
